# Patient Record
Sex: Female | Born: 1965
Health system: Southern US, Community
[De-identification: ages and names within clinical notes are randomized; demographics above are authoritative.]

## PROBLEM LIST (undated history)

## (undated) DIAGNOSIS — G5600 Carpal tunnel syndrome, unspecified upper limb: Secondary | ICD-10-CM

## (undated) DIAGNOSIS — I8390 Asymptomatic varicose veins of unspecified lower extremity: Secondary | ICD-10-CM

## (undated) DIAGNOSIS — K219 Gastro-esophageal reflux disease without esophagitis: Secondary | ICD-10-CM

## (undated) DIAGNOSIS — K589 Irritable bowel syndrome without diarrhea: Secondary | ICD-10-CM

## (undated) DIAGNOSIS — I10 Essential (primary) hypertension: Secondary | ICD-10-CM

## (undated) DIAGNOSIS — F329 Major depressive disorder, single episode, unspecified: Secondary | ICD-10-CM

## (undated) DIAGNOSIS — E119 Type 2 diabetes mellitus without complications: Secondary | ICD-10-CM

## (undated) DIAGNOSIS — F909 Attention-deficit hyperactivity disorder, unspecified type: Secondary | ICD-10-CM

## (undated) DIAGNOSIS — F419 Anxiety disorder, unspecified: Secondary | ICD-10-CM

## (undated) DIAGNOSIS — F32A Depression, unspecified: Secondary | ICD-10-CM

## (undated) HISTORY — PX: WISDOM TOOTH EXTRACTION: SHX21

## (undated) HISTORY — DX: Asymptomatic varicose veins of unspecified lower extremity: I83.90

## (undated) HISTORY — PX: COLONOSCOPY: SHX174

## (undated) HISTORY — PX: DILATION AND CURETTAGE OF UTERUS: SHX78

## (undated) HISTORY — PX: NOVASURE ABLATION: SHX5394

---

## 1998-07-24 ENCOUNTER — Inpatient Hospital Stay (HOSPITAL_COMMUNITY): Admission: AD | Admit: 1998-07-24 | Discharge: 1998-07-24 | Payer: Self-pay | Admitting: Obstetrics and Gynecology

## 1998-09-17 ENCOUNTER — Ambulatory Visit (HOSPITAL_COMMUNITY): Admission: AD | Admit: 1998-09-17 | Discharge: 1998-09-17 | Payer: Self-pay | Admitting: Obstetrics and Gynecology

## 1998-12-03 ENCOUNTER — Inpatient Hospital Stay (HOSPITAL_COMMUNITY): Admission: AD | Admit: 1998-12-03 | Discharge: 1998-12-05 | Payer: Self-pay | Admitting: Obstetrics and Gynecology

## 2001-03-19 ENCOUNTER — Other Ambulatory Visit: Admission: RE | Admit: 2001-03-19 | Discharge: 2001-03-19 | Payer: Self-pay | Admitting: Obstetrics and Gynecology

## 2002-03-01 ENCOUNTER — Encounter: Admission: RE | Admit: 2002-03-01 | Discharge: 2002-03-01 | Payer: Self-pay | Admitting: Urology

## 2002-03-01 ENCOUNTER — Encounter: Payer: Self-pay | Admitting: Urology

## 2002-06-26 ENCOUNTER — Other Ambulatory Visit: Admission: RE | Admit: 2002-06-26 | Discharge: 2002-06-26 | Payer: Self-pay | Admitting: Obstetrics and Gynecology

## 2003-04-05 HISTORY — PX: FOOT FASCIOTOMY: SHX1659

## 2003-05-09 ENCOUNTER — Other Ambulatory Visit: Admission: RE | Admit: 2003-05-09 | Discharge: 2003-05-09 | Payer: Self-pay | Admitting: Obstetrics and Gynecology

## 2003-06-02 ENCOUNTER — Ambulatory Visit (HOSPITAL_COMMUNITY): Admission: RE | Admit: 2003-06-02 | Discharge: 2003-06-02 | Payer: Self-pay | Admitting: Obstetrics and Gynecology

## 2004-10-26 ENCOUNTER — Other Ambulatory Visit: Admission: RE | Admit: 2004-10-26 | Discharge: 2004-10-26 | Payer: Self-pay | Admitting: Obstetrics and Gynecology

## 2011-02-02 MED ORDER — ALBUTEROL SULFATE HFA 108 (90 BASE) MCG/ACT IN AERS
INHALATION_SPRAY | RESPIRATORY_TRACT | Status: AC
  Filled 2011-02-02: qty 6.7

## 2011-02-28 ENCOUNTER — Encounter (HOSPITAL_COMMUNITY): Payer: Self-pay | Admitting: Pharmacist

## 2011-03-03 NOTE — H&P (Signed)
NAMEVEARL, AITKEN                  ACCOUNT NO.:  [id number]  MEDICAL RECORD NO.:  [id number]  LOCATION:  [facility]                         FACILITY:  [facility]  PHYSICIAN:  Dineen Kid. Rana Snare, M.D.    DATE OF BIRTH:  [birth year]  DATE OF ADMISSION:  01/17/2011 DATE OF DISCHARGE:                             HISTORY & PHYSICAL   HISTORY OF PRESENT ILLNESS:  Patricia Arroyo is a 45 year old, G2, P2, with pelvic pain, history of fibroids.  Her mother recently died of ovarian cancer.  She also is having symptomatic pelvic relaxation.  She desires surgical repair and presents for hysterectomy with repair of cystocele and rectocele.  She does have stress urinary incontinence on a regular basis as well as pelvic pain.  She has had NovaSure ablation in the past which has helped with her heavy periods and recurrent polyps, but now she desires hysterectomy due to ongoing pain.  PAST MEDICAL HISTORY:  Significant for hypertension and ADHD.  PAST SURGICAL HISTORY:  She has had 2 vaginal deliveries.  She has also had D and C with polypectomy and D and C with ablation.  ALLERGIES:  She reports an allergy to SULFA and PENICILLIN.  MEDICATIONS:  Lisinopril, Maxzide, Nexium, and Concerta.  PHYSICAL EXAMINATION:  HEART:  Regular rate and rhythm. LUNGS:  Clear to auscultation bilaterally. ABDOMEN:  Nondistended and nontender. PELVIC:  Uterus is anteverted, mobile, nontender.  She does have a second-degree cystocele and rectocele.  Uterus is normal in size, but anteverted and mobile. VITAL SIGNS:  Her blood pressure is 132/82.  IMPRESSION AND PLAN:  Pelvic pain, family history of ovarian cancer, fibroids, cystocele, rectocele, and stress urinary incontinence.  The patient had evaluation by a urologist for possible sling procedure. Could not get this as a combined procedure, so she wished to proceed with hysterectomy with anterior-posterior repair at this time.  PLAN:  To proceed with laparoscopic-assisted vaginal  hysterectomy.  She does desire removal of both tubes and ovaries with an anterior-posterior repair and sacrospinous suspension of the vaginal cuff.  I discussed the procedure at length.  Its risks, its benefits, success and complication rates.  She does understand that the anterior repair does not have great success with urinary incontinence that she may still require a suspension procedure.  She does want to proceed however at this time.  The risks include but not limited to risk of infection, bleeding, damage to the bowel, bladder, ureters, risks associated with anesthesia and blood transfusion.  She does give her informed consent and wished to proceed.     Dineen Kid Rana Snare, M.D.     DCL/MEDQ  D:  03/03/2011  T:  03/03/2011  Job:  [id number]

## 2011-03-03 NOTE — H&P (Signed)
  H&P DIctated #[id number]  03/03/11 DL

## 2011-03-07 ENCOUNTER — Encounter (HOSPITAL_COMMUNITY): Payer: Self-pay

## 2011-03-07 ENCOUNTER — Encounter (HOSPITAL_COMMUNITY)
Admission: RE | Admit: 2011-03-07 | Discharge: 2011-03-07 | Disposition: A | Discharge status: Home / Self Care | Payer: 59 | Source: Ambulatory Visit | Attending: Obstetrics and Gynecology | Admitting: Obstetrics and Gynecology

## 2011-03-07 HISTORY — DX: Gastro-esophageal reflux disease without esophagitis: K21.9

## 2011-03-07 HISTORY — DX: Depression, unspecified: F32.A

## 2011-03-07 HISTORY — DX: Attention-deficit hyperactivity disorder, unspecified type: F90.9

## 2011-03-07 HISTORY — DX: Irritable bowel syndrome, unspecified: K58.9

## 2011-03-07 HISTORY — DX: Essential (primary) hypertension: I10

## 2011-03-07 HISTORY — DX: Major depressive disorder, single episode, unspecified: F32.9

## 2011-03-07 LAB — CBC
HCT: 42.5 % (ref 36.0–46.0)
Hemoglobin: 14.1 g/dL (ref 12.0–15.0)
MCH: 30 pg (ref 26.0–34.0)
MCHC: 33.2 g/dL (ref 30.0–36.0)
MCV: 90.4 fL (ref 78.0–100.0)
Platelets: 251 10*3/uL (ref 150–400)
RBC: 4.7 MIL/uL (ref 3.87–5.11)
RDW: 12.7 % (ref 11.5–15.5)
WBC: 9.1 10*3/uL (ref 4.0–10.5)

## 2011-03-07 LAB — BASIC METABOLIC PANEL
BUN: 12 mg/dL (ref 6–23)
CO2: 27 mEq/L (ref 19–32)
Calcium: 9.6 mg/dL (ref 8.4–10.5)
Chloride: 100 mEq/L (ref 96–112)
Creatinine, Ser: 0.73 mg/dL (ref 0.50–1.10)
GFR calc Af Amer: >90 mL/min (ref >90)
GFR calc non Af Amer: >90 mL/min (ref >90)
Glucose, Bld: 127 mg/dL — ABNORMAL HIGH (ref 70–99)
Potassium: 3.2 mEq/L — ABNORMAL LOW (ref 3.5–5.1)
Sodium: 138 mEq/L (ref 135–145)

## 2011-03-07 LAB — SURGICAL PCR SCREEN
MRSA, PCR: NEGATIVE
Staphylococcus aureus: POSITIVE — AB

## 2011-03-07 NOTE — Patient Instructions (Signed)
YOUR PROCEDURE IS SCHEDULED ON:03/10/11  ENTER THROUGH THE MAIN ENTRANCE OF [facility] AT:6am  USE DESK PHONE AND DIAL [phone] TO INFORM us OF YOUR ARRIVAL  CALL [phone] IF YOU HAVE ANY QUESTIONS OR PROBLEMS PRIOR TO YOUR ARRIVAL.  REMEMBER: DO NOT EAT OR DRINK AFTER MIDNIGHT :Wed  SPECIAL INSTRUCTIONS:   YOU MAY BRUSH YOUR TEETH THE MORNING OF SURGERY   TAKE THESE MEDICINES THE DAY OF SURGERY WITH SIP OF WATER: Nexium, BP meds   DO NOT WEAR JEWELRY, EYE MAKEUP, LIPSTICK OR DARK FINGERNAIL POLISH DO NOT WEAR LOTIONS OR DEODORANT DO NOT SHAVE FOR 48 HOURS PRIOR TO SURGERY  YOU WILL NOT BE ALLOWED TO DRIVE YOURSELF HOME.  NAME OF DRIVER:Steven

## 2011-03-07 NOTE — H&P (Addendum)
  DiIctated H&P 03/07/11 #[id number] DL Pt seen and examined.  H&P is current. DL  16/1/09 [id number]

## 2011-03-08 NOTE — H&P (Signed)
Patricia Arroyo, Patricia Arroyo                  ACCOUNT NO.:  [id number]  MEDICAL RECORD NO.:  [id number]  LOCATION:  [facility]                           FACILITY:  [facility]  PHYSICIAN:  Dineen Kid. Rana Snare, M.D.    DATE OF BIRTH:  25-Oct-1965  DATE OF ADMISSION:  03/07/2011 DATE OF DISCHARGE:  03/07/2011                             HISTORY & PHYSICAL   HISTORY OF PRESENT ILLNESS:  Ms. Patricia Arroyo is a 45 year old, she is a G2, P2, problems with pelvic relaxation.  She has a cystocele and rectocele which requires digitalization.  She also has pelvic pain and uterine prolapse.  She has had a uterine ablation in the past which has controlled her menorrhagia but currently she is having difficulty with the pelvic relaxation, cystocele, rectocele, and does require digitalization.  She does have mild urinary incontinence.  She desires definitive surgical intervention and presents today for hysterectomy but also repair of the cystocele and rectocele and pelvic prolapse.  PAST MEDICAL HISTORY:  Significant for hypertension and gastroesophageal reflux and irritable bowel syndrome.  PAST SURGICAL HISTORY:  She has had vaginal births.  She has also had NovaSure endometrial ablation.  She does have a history of uterine fibroids.  MEDICATIONS: 1. Lisinopril 10 mg daily. 2. Maxzide 37.5 mg. 3. Omeprazole 40 mg.  ALLERGIES:  She gives an allergy to PENICILLIN and SULFA, however, has done Rocephin in the past without difficulty.  She also has an allergy to ADHESIVES and BANDAGES.  PHYSICAL EXAMINATION:  VITAL SIGNS:  Her blood pressure is 128/88. HEART:  Regular rate and rhythm. LUNGS:  Clear to auscultation bilaterally. ABDOMEN:  Soft, nontender, nondistended.  No rebound or guarding.  No flank pain. GENITOURINARY:  She has normal external genitalia, Bartholin, Skene's, urethra.  She does have a second-degree cystocele, second-degree rectocele.  She has uterine prolapse with the cervix just inside the introitus.  She  does have a hypermobile urethra.  The uterus is anteverted and approximately 6-8 week size.  IMPRESSION/PLAN:  Symptomatic pelvic relaxation with cystocele and rectocele and mild stress urinary incontinence.  She was offered surgical evaluation by urologist for possible incontinence procedures and sling procedures.  She declines.  She does desire definitive surgical intervention including anterior posterior colporrhaphy and sacrospinous colposuspension, also planned hysterectomy, planned laparoscopic-assisted hysterectomy.  She also desires removal of both tubes and ovaries.  Does have a mother who died of ovarian cancer recently due to ovarian cancer.  She would like these removed.  I had a lengthy discussion with regards to the surgery, its risks, its benefits. Risks include, but not limited to risk of infection, bleeding, damage to bowel, bladder, ureters, possibly this may not alleviate her or remove entirely her risk of ovarian cancer by removing the ovaries.  She also understands that the anterior repair may not successfully fix her incontinence and certainly does not have a long- term success rate that is above 60%.  She does give her informed consent and wished to proceed.  I also discussed the possibility of long-term or short-term catheter or self-catheterization.  Again she gives informed consent and wished to proceed.     Dineen Kid Rana Snare, M.D.  DCL/MEDQ  D:  03/07/2011  T:  03/08/2011  Job:  [id number]

## 2011-03-09 MED ORDER — CEFOTETAN DISODIUM 1 G IJ SOLR
1.0000 g | INTRAMUSCULAR | Status: AC
  Administered 2011-03-10: 1 g via INTRAVENOUS
  Filled 2011-03-09: qty 1

## 2011-03-10 ENCOUNTER — Other Ambulatory Visit: Payer: Self-pay | Admitting: Obstetrics and Gynecology

## 2011-03-10 ENCOUNTER — Encounter (HOSPITAL_COMMUNITY): Payer: Self-pay | Admitting: Registered Nurse

## 2011-03-10 ENCOUNTER — Ambulatory Visit (HOSPITAL_COMMUNITY)
Admission: RE | Admit: 2011-03-10 | Discharge: 2011-03-12 | Disposition: A | Discharge status: Home / Self Care | Payer: 59 | Source: Ambulatory Visit | Attending: Obstetrics and Gynecology | Admitting: Obstetrics and Gynecology

## 2011-03-10 ENCOUNTER — Ambulatory Visit (HOSPITAL_COMMUNITY): Payer: 59 | Admitting: Registered Nurse

## 2011-03-10 ENCOUNTER — Encounter (HOSPITAL_COMMUNITY)
Admission: RE | Disposition: A | Discharge status: Home / Self Care | Payer: Self-pay | Source: Ambulatory Visit | Attending: Obstetrics and Gynecology

## 2011-03-10 ENCOUNTER — Encounter (HOSPITAL_COMMUNITY): Payer: Self-pay | Admitting: *Deleted

## 2011-03-10 DIAGNOSIS — N831 Corpus luteum cyst of ovary, unspecified side: Secondary | ICD-10-CM | POA: Insufficient documentation

## 2011-03-10 DIAGNOSIS — N8300 Follicular cyst of ovary, unspecified side: Secondary | ICD-10-CM | POA: Insufficient documentation

## 2011-03-10 DIAGNOSIS — Z90710 Acquired absence of both cervix and uterus: Secondary | ICD-10-CM

## 2011-03-10 DIAGNOSIS — N838 Other noninflammatory disorders of ovary, fallopian tube and broad ligament: Secondary | ICD-10-CM | POA: Insufficient documentation

## 2011-03-10 DIAGNOSIS — N812 Incomplete uterovaginal prolapse: Secondary | ICD-10-CM | POA: Insufficient documentation

## 2011-03-10 DIAGNOSIS — N949 Unspecified condition associated with female genital organs and menstrual cycle: Secondary | ICD-10-CM | POA: Insufficient documentation

## 2011-03-10 HISTORY — PX: ANTERIOR AND POSTERIOR REPAIR: SHX5121

## 2011-03-10 HISTORY — PX: LAPAROSCOPIC ASSISTED VAGINAL HYSTERECTOMY: SHX5398

## 2011-03-10 HISTORY — PX: SALPINGOOPHORECTOMY: SHX82

## 2011-03-10 SURGERY — HYSTERECTOMY, VAGINAL, LAPAROSCOPY-ASSISTED
Anesthesia: General

## 2011-03-10 MED ORDER — FENTANYL CITRATE 0.05 MG/ML IJ SOLN
INTRAMUSCULAR | Status: AC
  Filled 2011-03-10: qty 5

## 2011-03-10 MED ORDER — IBUPROFEN 600 MG PO TABS
600.0000 mg | ORAL_TABLET | Freq: Four times a day (QID) | ORAL | Status: DC | PRN
  Administered 2011-03-11 (×2): 600 mg via ORAL
  Filled 2011-03-10 (×2): qty 1

## 2011-03-10 MED ORDER — DEXTROSE-NACL 5-0.45 % IV SOLN
INTRAVENOUS | Status: DC
  Administered 2011-03-10 – 2011-03-11 (×2): via INTRAVENOUS

## 2011-03-10 MED ORDER — NEOSTIGMINE METHYLSULFATE 1 MG/ML IJ SOLN
INTRAMUSCULAR | Status: DC | PRN
  Administered 2011-03-10: 3 mg via INTRAVENOUS

## 2011-03-10 MED ORDER — MORPHINE SULFATE 10 MG/ML IJ SOLN
INTRAMUSCULAR | Status: AC
  Filled 2011-03-10: qty 1

## 2011-03-10 MED ORDER — HYDROMORPHONE HCL PF 1 MG/ML IJ SOLN
0.2500 mg | INTRAMUSCULAR | Status: DC | PRN
  Administered 2011-03-10 (×2): 0.5 mg via INTRAVENOUS

## 2011-03-10 MED ORDER — OXYCODONE-ACETAMINOPHEN 5-325 MG PO TABS
1.0000 | ORAL_TABLET | ORAL | Status: DC | PRN
  Administered 2011-03-11 (×3): 1 via ORAL
  Administered 2011-03-12: 2 via ORAL
  Administered 2011-03-12: 1 via ORAL
  Filled 2011-03-10: qty 1
  Filled 2011-03-10: qty 2
  Filled 2011-03-10 (×4): qty 1

## 2011-03-10 MED ORDER — ZOLPIDEM TARTRATE 5 MG PO TABS
5.0000 mg | ORAL_TABLET | Freq: Every evening | ORAL | Status: DC | PRN
  Administered 2011-03-11: 10 mg via ORAL
  Filled 2011-03-10: qty 2

## 2011-03-10 MED ORDER — HYDROMORPHONE HCL PF 1 MG/ML IJ SOLN
INTRAMUSCULAR | Status: AC
  Administered 2011-03-10: 0.5 mg via INTRAVENOUS
  Filled 2011-03-10: qty 1

## 2011-03-10 MED ORDER — DEXAMETHASONE SODIUM PHOSPHATE 10 MG/ML IJ SOLN
INTRAMUSCULAR | Status: DC | PRN
  Administered 2011-03-10: 10 mg via INTRAVENOUS

## 2011-03-10 MED ORDER — ROCURONIUM BROMIDE 100 MG/10ML IV SOLN
INTRAVENOUS | Status: DC | PRN
  Administered 2011-03-10: 50 mg via INTRAVENOUS

## 2011-03-10 MED ORDER — HYDROMORPHONE 0.3 MG/ML IV SOLN
INTRAVENOUS | Status: DC
  Administered 2011-03-10: 0.6 mg via INTRAVENOUS
  Administered 2011-03-10: 7.5 mg via INTRAVENOUS
  Administered 2011-03-10: 0.3 mg via INTRAVENOUS
  Administered 2011-03-10: 0.6 mg via INTRAVENOUS
  Administered 2011-03-11: 1.5 mg via INTRAVENOUS
  Administered 2011-03-11: 0.3 mg via INTRAVENOUS

## 2011-03-10 MED ORDER — DEXAMETHASONE SODIUM PHOSPHATE 10 MG/ML IJ SOLN
INTRAMUSCULAR | Status: AC
  Filled 2011-03-10: qty 1

## 2011-03-10 MED ORDER — ONDANSETRON HCL 4 MG/2ML IJ SOLN: 4.0000 mg | Freq: Four times a day (QID) | INTRAMUSCULAR | Status: DC | PRN

## 2011-03-10 MED ORDER — NEOSTIGMINE METHYLSULFATE 1 MG/ML IJ SOLN
INTRAMUSCULAR | Status: AC
  Filled 2011-03-10: qty 10

## 2011-03-10 MED ORDER — LACTATED RINGERS IV SOLN
INTRAVENOUS | Status: DC | PRN
  Administered 2011-03-10 (×3): via INTRAVENOUS

## 2011-03-10 MED ORDER — ROCURONIUM BROMIDE 50 MG/5ML IV SOLN
INTRAVENOUS | Status: AC
  Filled 2011-03-10: qty 1

## 2011-03-10 MED ORDER — DIPHENHYDRAMINE HCL 50 MG/ML IJ SOLN: 12.5000 mg | Freq: Four times a day (QID) | INTRAMUSCULAR | Status: DC | PRN

## 2011-03-10 MED ORDER — LISINOPRIL 10 MG PO TABS
10.0000 mg | ORAL_TABLET | Freq: Every day | ORAL | Status: DC
  Administered 2011-03-11 – 2011-03-12 (×2): 10 mg via ORAL
  Filled 2011-03-10 (×4): qty 1

## 2011-03-10 MED ORDER — KETOROLAC TROMETHAMINE 30 MG/ML IJ SOLN: 15.0000 mg | Freq: Once | INTRAMUSCULAR | Status: DC | PRN

## 2011-03-10 MED ORDER — ONDANSETRON HCL 4 MG/2ML IJ SOLN
INTRAMUSCULAR | Status: AC
  Filled 2011-03-10: qty 2

## 2011-03-10 MED ORDER — BUPIVACAINE HCL (PF) 0.25 % IJ SOLN
INTRAMUSCULAR | Status: DC | PRN
  Administered 2011-03-10: 10 mL

## 2011-03-10 MED ORDER — PROMETHAZINE HCL 25 MG/ML IJ SOLN: 12.5000 mg | INTRAMUSCULAR | Status: DC | PRN

## 2011-03-10 MED ORDER — ONDANSETRON HCL 4 MG/2ML IJ SOLN
INTRAMUSCULAR | Status: DC | PRN
  Administered 2011-03-10: 4 mg via INTRAVENOUS

## 2011-03-10 MED ORDER — MIDAZOLAM HCL 2 MG/2ML IJ SOLN
INTRAMUSCULAR | Status: AC
  Filled 2011-03-10: qty 2

## 2011-03-10 MED ORDER — LIDOCAINE HCL (CARDIAC) 20 MG/ML IV SOLN
INTRAVENOUS | Status: AC
  Filled 2011-03-10: qty 5

## 2011-03-10 MED ORDER — GLYCOPYRROLATE 0.2 MG/ML IJ SOLN
INTRAMUSCULAR | Status: AC
  Filled 2011-03-10: qty 1

## 2011-03-10 MED ORDER — NALOXONE HCL 0.4 MG/ML IJ SOLN: 0.4000 mg | INTRAMUSCULAR | Status: DC | PRN

## 2011-03-10 MED ORDER — DIPHENHYDRAMINE HCL 12.5 MG/5ML PO ELIX
12.5000 mg | ORAL_SOLUTION | Freq: Four times a day (QID) | ORAL | Status: DC | PRN
  Filled 2011-03-10: qty 5

## 2011-03-10 MED ORDER — MIDAZOLAM HCL 5 MG/5ML IJ SOLN
INTRAMUSCULAR | Status: DC | PRN
  Administered 2011-03-10: 2 mg via INTRAVENOUS

## 2011-03-10 MED ORDER — PROPOFOL 10 MG/ML IV EMUL
INTRAVENOUS | Status: AC
  Filled 2011-03-10: qty 20

## 2011-03-10 MED ORDER — GLYCOPYRROLATE 0.2 MG/ML IJ SOLN
INTRAMUSCULAR | Status: DC | PRN
  Administered 2011-03-10: .4 mg via INTRAVENOUS

## 2011-03-10 MED ORDER — LACTATED RINGERS IV SOLN
INTRAVENOUS | Status: DC
  Administered 2011-03-10: 125 mL/h via INTRAVENOUS

## 2011-03-10 MED ORDER — SODIUM CHLORIDE 0.9 % IJ SOLN: 9.0000 mL | INTRAMUSCULAR | Status: DC | PRN

## 2011-03-10 MED ORDER — MORPHINE SULFATE 10 MG/ML IJ SOLN
INTRAMUSCULAR | Status: DC | PRN
  Administered 2011-03-10 (×2): 5 mg via INTRAVENOUS

## 2011-03-10 MED ORDER — FENTANYL CITRATE 0.05 MG/ML IJ SOLN
INTRAMUSCULAR | Status: DC | PRN
  Administered 2011-03-10 (×5): 50 ug via INTRAVENOUS
  Administered 2011-03-10: 100 ug via INTRAVENOUS
  Administered 2011-03-10 (×3): 50 ug via INTRAVENOUS

## 2011-03-10 MED ORDER — HYDROMORPHONE HCL PF 1 MG/ML IJ SOLN
0.2000 mg | INTRAMUSCULAR | Status: DC | PRN
  Filled 2011-03-10: qty 1

## 2011-03-10 MED ORDER — MENTHOL 3 MG MT LOZG: 1.0000 | LOZENGE | OROMUCOSAL | Status: DC | PRN

## 2011-03-10 MED ORDER — TRIAMTERENE-HCTZ 37.5-25 MG PO TABS
1.0000 | ORAL_TABLET | Freq: Every day | ORAL | Status: DC
  Administered 2011-03-11 – 2011-03-12 (×2): 1 via ORAL
  Filled 2011-03-10 (×5): qty 1

## 2011-03-10 MED ORDER — SODIUM CHLORIDE 0.9 % IR SOLN
Status: DC | PRN
  Administered 2011-03-10: 1

## 2011-03-10 MED ORDER — PROPOFOL 10 MG/ML IV EMUL
INTRAVENOUS | Status: DC | PRN
  Administered 2011-03-10: 200 mg via INTRAVENOUS

## 2011-03-10 MED ORDER — METHYLPHENIDATE HCL ER (OSM) 36 MG PO TBCR: 54.0000 mg | EXTENDED_RELEASE_TABLET | ORAL | Status: DC

## 2011-03-10 MED ORDER — LIDOCAINE HCL (CARDIAC) 20 MG/ML IV SOLN
INTRAVENOUS | Status: DC | PRN
  Administered 2011-03-10: 100 mg via INTRAVENOUS

## 2011-03-10 SURGICAL SUPPLY — 47 items
BLADE SURG 15 STRL LF C SS BP (BLADE) ×2 IMPLANT
BLADE SURG 15 STRL SS (BLADE) ×1
CABLE HIGH FREQUENCY MONO STRZ (ELECTRODE) IMPLANT
CATH ROBINSON RED A/P 16FR (CATHETERS) ×3 IMPLANT
CLOTH BEACON ORANGE TIMEOUT ST (SAFETY) ×3 IMPLANT
CONT PATH 16OZ SNAP LID 3702 (MISCELLANEOUS) ×3 IMPLANT
COVER TABLE BACK 60X90 (DRAPES) ×3 IMPLANT
DECANTER SPIKE VIAL GLASS SM (MISCELLANEOUS) IMPLANT
DERMABOND ADVANCED (GAUZE/BANDAGES/DRESSINGS) ×1
DERMABOND ADVANCED .7 DNX12 (GAUZE/BANDAGES/DRESSINGS) ×2 IMPLANT
ELECT LIGASURE LONG (ELECTRODE) ×6 IMPLANT
ELECT REM PT RETURN 9FT ADLT (ELECTROSURGICAL)
ELECTRODE REM PT RTRN 9FT ADLT (ELECTROSURGICAL) IMPLANT
FORCEPS CUTTING 45CM 5MM (CUTTING FORCEPS) ×6 IMPLANT
GAUZE PACKING 1 X5 YD ST (GAUZE/BANDAGES/DRESSINGS) ×3 IMPLANT
GAUZE SPONGE 4X4 16PLY XRAY LF (GAUZE/BANDAGES/DRESSINGS) ×6 IMPLANT
GLOVE BIO SURGEON STRL SZ8 (GLOVE) ×3 IMPLANT
GLOVE BIOGEL PI IND STRL 6.5 (GLOVE) ×2 IMPLANT
GLOVE BIOGEL PI INDICATOR 6.5 (GLOVE) ×1
GLOVE SURG ORTHO 8.0 STRL STRW (GLOVE) ×9 IMPLANT
GOWN PREVENTION PLUS LG XLONG (DISPOSABLE) ×9 IMPLANT
NEEDLE HYPO 22GX1.5 SAFETY (NEEDLE) IMPLANT
NEEDLE INSUFFLATION 14GA 120MM (NEEDLE) ×3 IMPLANT
NEEDLE MAYO 6 CRC TAPER PT (NEEDLE) IMPLANT
NEEDLE SPNL 22GX3.5 QUINCKE BK (NEEDLE) IMPLANT
NS IRRIG 1000ML POUR BTL (IV SOLUTION) ×3 IMPLANT
PACK LAVH (CUSTOM PROCEDURE TRAY) ×3 IMPLANT
PACK VAGINAL WOMENS (CUSTOM PROCEDURE TRAY) IMPLANT
SET IRRIG TUBING LAPAROSCOPIC (IRRIGATION / IRRIGATOR) ×3 IMPLANT
SOLUTION ELECTROLUBE (MISCELLANEOUS) IMPLANT
STRIP CLOSURE SKIN 1/4X3 (GAUZE/BANDAGES/DRESSINGS) IMPLANT
SUT MNCRL 0 MO-4 VIOLET 18 CR (SUTURE) ×4 IMPLANT
SUT MNCRL 0 VIOLET 6X18 (SUTURE) ×2 IMPLANT
SUT MNCRL AB 0 CT1 27 (SUTURE) IMPLANT
SUT MON AB 2-0 CT1 27 (SUTURE) ×12 IMPLANT
SUT MON AB 2-0 CT1 36 (SUTURE) IMPLANT
SUT MONOCRYL 0 6X18 (SUTURE) ×1
SUT MONOCRYL 0 MO 4 18  CR/8 (SUTURE) ×2
SUT PDS AB 0 CT1 27 (SUTURE) ×6 IMPLANT
SUT VICRYL 0 UR6 27IN ABS (SUTURE) ×3 IMPLANT
SUT VICRYL RAPIDE 3 0 (SUTURE) ×3 IMPLANT
TOWEL OR 17X24 6PK STRL BLUE (TOWEL DISPOSABLE) ×6 IMPLANT
TRAY FOLEY CATH 14FR (SET/KITS/TRAYS/PACK) ×3 IMPLANT
TROCAR Z-THREAD BLADED 11X100M (TROCAR) ×3 IMPLANT
TROCAR Z-THREAD BLADED 5X100MM (TROCAR) ×3 IMPLANT
WARMER LAPAROSCOPE (MISCELLANEOUS) ×3 IMPLANT
WATER STERILE IRR 1000ML POUR (IV SOLUTION) ×3 IMPLANT

## 2011-03-10 NOTE — Op Note (Addendum)
Op note (dictation [phone]) Preop:  Symptomatic Pelvic Relaxation Postop:  Same Proc:  LAVH/BSO/A&P REpair/ SSLS Juliocesar Blasius Asst Neal GET EBL 200cc Findings normal anatomy Compl : None Drains Foley Vag Packing

## 2011-03-10 NOTE — Transfer of Care (Signed)
Immediate Anesthesia Transfer of Care Note  Patient: Patricia Arroyo  Procedure(s) Performed:  LAPAROSCOPIC ASSISTED VAGINAL HYSTERECTOMY; SALPINGO OOPHERECTOMY; ANTERIOR (CYSTOCELE) AND POSTERIOR REPAIR (RECTOCELE) - with Sacrospinous Ligament Suspension  Patient Location: PACU  Anesthesia Type: General  Level of Consciousness: sedated  Airway & Oxygen Therapy: Patient Spontanous Breathing and Patient connected to face mask oxygen  Post-op Assessment: Report given to PACU RN  Post vital signs: Reviewed  Complications: No apparent anesthesia complications

## 2011-03-10 NOTE — Addendum Note (Signed)
Addendum  created 03/10/11 1312 by Doreene Burke   Modules edited:Anesthesia LDA

## 2011-03-10 NOTE — Anesthesia Preprocedure Evaluation (Signed)
Anesthesia Evaluation  Patient identified by MRN, date of birth, ID band Patient awake    Reviewed: Allergy & Precautions, H&P , NPO status , Patient's Chart, lab work & pertinent test results, reviewed documented beta blocker date and time   History of Anesthesia Complications Negative for: history of anesthetic complications  Airway Mallampati: II TM Distance: >3 FB Neck ROM: full    Dental  (+) Teeth Intact   Pulmonary neg pulmonary ROS,  clear to auscultation  Pulmonary exam normal       Cardiovascular Exercise Tolerance: Good hypertension, On Medications regular Normal    Neuro/Psych PSYCHIATRIC DISORDERS (depression, ADHD) Negative Neurological ROS     GI/Hepatic Neg liver ROS, GERD-  Medicated,  Endo/Other  Morbid obesity  Renal/GU negative Renal ROS  Female GU complaint     Musculoskeletal   Abdominal   Peds  Hematology negative hematology ROS (+)   Anesthesia Other Findings   Reproductive/Obstetrics negative OB ROS                           Anesthesia Physical Anesthesia Plan  ASA: II  Anesthesia Plan: General ETT   Post-op Pain Management:    Induction:   Airway Management Planned:   Additional Equipment:   Intra-op Plan:   Post-operative Plan:   Informed Consent: I have reviewed the patients History and Physical, chart, labs and discussed the procedure including the risks, benefits and alternatives for the proposed anesthesia with the patient or authorized representative who has indicated his/her understanding and acceptance.   Dental Advisory Given  Plan Discussed with: CRNA and Surgeon  Anesthesia Plan Comments:         Anesthesia Quick Evaluation

## 2011-03-10 NOTE — Anesthesia Postprocedure Evaluation (Signed)
Anesthesia Post Note  Patient: Patricia Arroyo  Procedure(s) Performed:  LAPAROSCOPIC ASSISTED VAGINAL HYSTERECTOMY; SALPINGO OOPHERECTOMY; ANTERIOR (CYSTOCELE) AND POSTERIOR REPAIR (RECTOCELE) - with Sacrospinous Ligament Suspension  Anesthesia type: General  Patient location: PACU  Post pain: Pain level controlled  Post assessment: Post-op Vital signs reviewed  Last Vitals:  Filed Vitals:   03/10/11 1000  BP: 127/76  Pulse: 64  Temp:   Resp: 12    Post vital signs: Reviewed  Level of consciousness: sedated  Complications: No apparent anesthesia complicationsfj

## 2011-03-10 NOTE — Preoperative (Signed)
Beta Blockers   Reason not to administer Beta Blockers:Not Applicable 

## 2011-03-11 LAB — CBC
HCT: 36 % (ref 36.0–46.0)
Hemoglobin: 12 g/dL (ref 12.0–15.0)
MCH: 30.3 pg (ref 26.0–34.0)
MCHC: 33.3 g/dL (ref 30.0–36.0)
MCV: 90.9 fL (ref 78.0–100.0)
Platelets: 260 10*3/uL (ref 150–400)
RBC: 3.96 MIL/uL (ref 3.87–5.11)
RDW: 12.8 % (ref 11.5–15.5)
WBC: 17.7 10*3/uL — ABNORMAL HIGH (ref 4.0–10.5)

## 2011-03-11 MED ORDER — MENTHOL 3 MG MT LOZG: 1.0000 | LOZENGE | OROMUCOSAL | Status: DC | PRN

## 2011-03-11 MED ORDER — ZOLPIDEM TARTRATE 10 MG PO TABS: 10.0000 mg | ORAL_TABLET | Freq: Every evening | ORAL | Status: DC | PRN

## 2011-03-11 MED ORDER — PANTOPRAZOLE SODIUM 40 MG PO TBEC
40.0000 mg | DELAYED_RELEASE_TABLET | Freq: Every day | ORAL | Status: DC
  Administered 2011-03-12: 40 mg via ORAL
  Filled 2011-03-11 (×2): qty 1

## 2011-03-11 NOTE — Progress Notes (Signed)
1 Day Post-Op Procedure(s): LAPAROSCOPIC ASSISTED VAGINAL HYSTERECTOMY SALPINGO OOPHERECTOMY ANTERIOR (CYSTOCELE) AND POSTERIOR REPAIR (RECTOCELE)  Subjective: Patient reports tolerating PO and + flatus.    Objective: I have reviewed patient's vital signs, intake and output, medications and labs.  General: alert, cooperative and mild distress Incision clean cry and intact.  BS + Vaginal packing removed  Assessment: s/p Procedure(s): LAPAROSCOPIC ASSISTED VAGINAL HYSTERECTOMY SALPINGO OOPHERECTOMY ANTERIOR (CYSTOCELE) AND POSTERIOR REPAIR (RECTOCELE): stable  Plan: Advance diet Encourage ambulation bladder training  LOS: 1 day    Cristianna Cyr C 03/11/2011, 8:25 AM

## 2011-03-11 NOTE — Op Note (Signed)
NAMEBALBINA, DEPACE                  ACCOUNT NO.:  [id number]  MEDICAL RECORD NO.:  [id number]  LOCATION:  9317                          FACILITY:  [facility]  PHYSICIAN:  Dineen Kid. Rana Snare, M.D.    DATE OF BIRTH:  02/11/1966  DATE OF PROCEDURE:  03/10/2011 DATE OF DISCHARGE:                              OPERATIVE REPORT   PREOPERATIVE DIAGNOSES:  Symptomatic pelvic relaxation with cystocele and rectocele, uterine prolapse, and pelvic pain.  POSTOPERATIVE DIAGNOSES:  Symptomatic pelvic relaxation with cystocele and rectocele, uterine prolapse, and pelvic pain.  PROCEDURES: 1. Laparoscopic-assisted vaginal hysterectomy. 2. Bilateral salpingo-oophorectomy. 3. Anterior and posterior colporrhaphy with sacrospinous ligament     suspension.  SURGEON:  Dineen Kid. Rana Snare, MD.  ASSISTANTFreddy Finner, MD.  ANESTHESIA:  General endotracheal.  INDICATIONS:  Ms. Priego is a 45 year old, G2, P2, with worsening problems with symptomatic pelvic relaxation including cystocele, rectocele, which requires digitalization, also pelvic pain and uterine prolapse.  She has had a uterine ablation in the past.  She is also having some mild urinary incontinence.  She desires definitive surgical intervention and presents for hysterectomy with repair of cystocele and rectocele and pelvic prolapse.  Risks and benefits were discussed at length.  Informed consent was obtained.  See history and physical for further details.  FINDINGS:  At the time of surgery, normal-appearing appendix.  Uterus is slightly enlarged, normal-appearing ovaries and pelvis.  DESCRIPTION OF PROCEDURE:  After adequate anesthesia, the patient was placed in the dorsal lithotomy position.  She was sterilely prepped and draped.  The bladder was sterilely drained.  Speculum was placed into the cervix and a Hulka tenaculum was then placed.  1-cm infraumbilical skin incision was made, a Veress needle was inserted, and the abdomen was insufflated  with dullness to percussion.  An 11-mm trocar was inserted and the above findings were noted by laparoscope.  5-mm trocar was inserted left to the midline 2 fingerbreadths above pubic symphysis under direct visualization.  After careful and systematic evaluation of the abdomen and pelvis, a Gyrus cutting forceps were used to ligate and dissect across the right infundibulopelvic ligament down to the inferior portion of the broad ligament with the ovary and fallopian tube falling towards the uterus.  This was done similarly across the left infundibulopelvic ligament, down across the round ligament with good hemostasis achieved and care taken to avoid the underlying ureter at the same time achieving good hemostasis.  The bladder was then elevated and a small window was made at the uterovesical junction.  The abdomen was then desufflated, the trocar was removed.  Legs repositioned.  A posterior weighted speculum was placed. Cervix was grasped and circumscribed with Bovie cautery.  The posterior colpotomy was performed.  LigaSure instrument was used to ligate across the uterosacral ligaments bilaterally, the uterine vasculature bilaterally, and the bladder pillars.  The anterior vaginal mucosa was then dissected off the anterior surface of the cervix and the anterior peritoneum was entered sharply and a Deaver retractor replaced underneath the bladder. The cardinal ligaments and the inferior portion of the broad ligaments were then ligated with the LigaSure instrument and the uterus, tubes, and ovaries were  removed.  Small packing was placed.  The uterosacral ligaments were identified, ligated with 0 Monocryl suture.  The posterior peritoneum was then closed in a pursestring fashion.  The posterior vagina was then closed in a vertical fashion using figure-of- eights of 0 Monocryl suture plicating the uterosacral ligaments in the midline.  The anterior vaginal mucosa was then dissected in the  midline and reflected laterally off the pubovesical cervical fascia.  The pubovesical cervical fascia was then plicated in the midline using figure-of-eight stitch of 0 Monocryl suture all the way up until approximately 1 cm from the urethral meatus with good support noted of the cystocele and the urethra.  The excess vaginal mucosa was then trimmed and the anterior vaginal mucosa was then closed with 2-0 Monocryl in a running fashion with good approximation and good hemostasis noted.  Allis clamps were placed on the lateral sides of the perineal body.  A triangular flap of skin was made across the perineal body and then the posterior vaginal mucosa was then undermined and dissected up to the midline with the posterior vaginal mucosa, reflecting the posterior vaginal mucosa laterally.  Blunt and sharp dissection were carried out reflecting the rectal fascia off the vaginal mucosa.  Blunt dissection was carried out to the right sacrospinous ligament.  A Schutt ligature carrier was used to place a suture of 0 PDS through the right uterosacral ligament approximately 2 cm off the sacral spinous process.  A Mayo needle was used to place the suture through the right apex of the vagina in a pulley-type stitch.  The posterior repair was then carried out plicating the rectal fascia using figure-of-eight 0 Monocryl suture down to the introitus.  Excess vaginal mucosa was then trimmed and the sacrospinous suspension suture was then tightened with good support of the apex of the vagina.  The poster vaginal mucosa was then closed with 2-0 Monocryl suture.  The perineal body was then plicated using 0 Monocryl suture of figure-of-eight plicating the superficial transverse perineal muscles.  The perineal skin was then closed in a subcuticular fashion with a 2-0 Monocryl suture, with good support of the perineum and posterior vagina noted and good support of the vaginal apex noted from the sacrospinous  process.  Vaginal packing was then placed coated with estrogen cream.  Foley catheter placed with return of clear yellow urine.  Rectal exam revealed no evidence of loss of integrity to the rectum or colon and a good support noted.  After re- gloving, legs were repositioned, abdomen reinsufflated.  Laparoscope was used to re-evaluate the abdomen and pelvis, and after Nezhat suction irrigator was used to irrigate the abdomen and pelvis, good hemostasis was achieved.  All pedicles appeared to be hemostatic and ureters peristalsed nicely without evidence of any compromise.  The abdomen was then desufflated and trocars were removed.  The infraumbilical skin incision was closed with 0 Vicryl interrupted suture in the fascia, 3-0 Vicryl Rapide subcuticular suture.  The 5-mm site was closed with 3-0 Vicryl Rapide and Dermabond.  The incisions were then injected with 0.25% Marcaine, total of 10 mL used.  The patient was stable on transfer to the recovery room.  Sponge, instrument, and needle count was normal x3.  The estimated blood loss was 250 mL.  Again, the patient received 1 g of Ceftin preoperatively.     Dineen Kid Rana Snare, M.D.     DCL/MEDQ  D:  03/11/2011  T:  03/11/2011  Job:  [id number]

## 2011-03-11 NOTE — Anesthesia Postprocedure Evaluation (Signed)
  Anesthesia Post-op Note  Patient: Patricia Arroyo  Procedure(s) Performed:  LAPAROSCOPIC ASSISTED VAGINAL HYSTERECTOMY; SALPINGO OOPHERECTOMY; ANTERIOR (CYSTOCELE) AND POSTERIOR REPAIR (RECTOCELE) - with Sacrospinous Ligament Suspension  Patient Location: [facility]  Anesthesia Type: General  Level of Consciousness: awake, alert  and oriented  Airway and Oxygen Therapy: Patient Spontanous Breathing  Post-op Assessment: Patient's Cardiovascular Status Stable and Respiratory Function Stable  Post-op Vital Signs: Reviewed and stable  Complications: No apparent anesthesia complications

## 2011-03-11 NOTE — Addendum Note (Signed)
Addendum  created 03/11/11 0920 by Edison Pace, CRNA   Modules edited:Notes Section

## 2011-03-12 MED ORDER — NITROFURANTOIN MONOHYD MACRO 100 MG PO CAPS: 100.0000 mg | ORAL_CAPSULE | ORAL | Status: AC

## 2011-03-12 MED ORDER — ESTRADIOL 2 MG PO TABS: 2.0000 mg | ORAL_TABLET | Freq: Every day | ORAL | Status: DC

## 2011-03-12 MED ORDER — OXYCODONE-ACETAMINOPHEN 5-500 MG PO CAPS: 1.0000 | ORAL_CAPSULE | ORAL | Status: AC | PRN

## 2011-03-12 NOTE — Discharge Summary (Signed)
Physician Discharge Summary  Patient ID: Patricia Arroyo MRN: [id number] DOB/AGE: 1965/11/30 45 y.o.  Admit date: 03/10/2011 Discharge date: 03/12/2011  Admission Diagnoses:  Discharge Diagnoses:  Active Problems:  * No active hospital problems. *    Discharged Condition: good  Hospital Course: underwent LAVH/BSO/A&P repair and SSLS without complications.  She had good return of bowel function and tolerated a regular diet and oral pain meds by day 1.  She had 2 voiding trials which were unsuccessful and so was sent home with a foley in place.  She remained afebrile and had normal post op hgb.  Consults: none  Significant Diagnostic Studies: labs:   Treatments: IV hydration, antibiotics: cefotetan and surgery: As above  Discharge Exam: Blood pressure 118/64, pulse 79, temperature 98.4 F (36.9 C), temperature source Oral, resp. rate 22, height 5\' 2"  (1.575 m), weight 89.359 kg (197 lb), SpO2 98.00%. Incisions CD & I   BS+ Abd soft and nontender  Disposition: home  Discharge Orders    Future Orders Please Complete By Expires   Diet general      Increase activity slowly      Call MD for:  temperature >100.4      Call MD for:  persistant nausea and vomiting      Call MD for:  severe uncontrolled pain      Call MD for:  redness, tenderness, or signs of infection (pain, swelling, redness, odor or green/yellow discharge around incision site)      Call MD for:  difficulty breathing, headache or visual disturbances      Discharge instructions      Comments:   Continue with leg bag and the office will call you to come in for voiding trial on 03/14/11     Current Discharge Medication List    START taking these medications   Details  estradiol (ESTRACE) 2 MG tablet Take 1 tablet (2 mg total) by mouth daily. Qty: 30 tablet, Refills: 12    nitrofurantoin, macrocrystal-monohydrate, (MACROBID) 100 MG capsule Take 1 capsule (100 mg total) by mouth 1 day or 1 dose. Qty: 10 capsule,  Refills: 0    oxyCODONE-acetaminophen (TYLOX) 5-500 MG per capsule Take 1 capsule by mouth every 4 (four) hours as needed for pain. Qty: 30 capsule, Refills: 0      CONTINUE these medications which have NOT CHANGED   Details  lisinopril (PRINIVIL,ZESTRIL) 20 MG tablet Take 10 mg by mouth daily.      triamterene-hydrochlorothiazide (MAXZIDE-25) 37.5-25 MG per tablet Take 1 tablet by mouth daily.      ibuprofen (ADVIL,MOTRIN) 200 MG tablet Take 200-800 mg by mouth every 6 (six) hours as needed. For pain     methylphenidate (CONCERTA) 54 MG CR tablet Take 54 mg by mouth every morning.        STOP taking these medications     esomeprazole (NEXIUM) 40 MG capsule          Signed: Siena Poehler C 03/12/2011, 3:04 PM

## 2011-03-12 NOTE — Progress Notes (Signed)
D/c instructions reviewed with pt. And husband.  Both state understanding of same.  Instructed pt. And husband on how to convert foley bag to leg bag and how to reconnect to leg bag.  Both state understanding and showed demo. Pt. Given 10cc syringe to remove cath. On Monday.  Ambulated to car with staff without incident.  D/c'd home with husband.

## 2011-03-12 NOTE — Progress Notes (Signed)
2 Days Post-Op Procedure(s): LAPAROSCOPIC ASSISTED VAGINAL HYSTERECTOMY SALPINGO OOPHERECTOMY ANTERIOR (CYSTOCELE) AND POSTERIOR REPAIR (RECTOCELE)  Subjective: Patient reports tolerating PO and + flatus.    Objective: I have reviewed patient's vital signs, intake and output, medications and labs.  [employer] CDI  BS+ Abd soft nt.   Assessment: s/p Procedure(s): LAPAROSCOPIC ASSISTED VAGINAL HYSTERECTOMY SALPINGO OOPHERECTOMY ANTERIOR (CYSTOCELE) AND POSTERIOR REPAIR (RECTOCELE): stable and tolerating diet Was unable to void when Foley dc'd Plan: Discharge home with leg bad to fu in office for voiding trial rx Tylox, Macrobid, Estradiol2mg   LOS: 2 days    Jasslyn Finkel C 03/12/2011, 2:55 PM

## 2011-03-14 ENCOUNTER — Encounter (HOSPITAL_COMMUNITY): Payer: Self-pay | Admitting: Obstetrics and Gynecology

## 2011-03-15 ENCOUNTER — Encounter (HOSPITAL_COMMUNITY): Payer: Self-pay | Admitting: *Deleted

## 2011-03-15 ENCOUNTER — Inpatient Hospital Stay (HOSPITAL_COMMUNITY)
Admission: AD | Admit: 2011-03-15 | Discharge: 2011-03-15 | Disposition: A | Discharge status: Home / Self Care | Payer: 59 | Source: Ambulatory Visit | Attending: Obstetrics and Gynecology | Admitting: Obstetrics and Gynecology

## 2011-03-15 DIAGNOSIS — N9989 Other postprocedural complications and disorders of genitourinary system: Secondary | ICD-10-CM | POA: Insufficient documentation

## 2011-03-15 DIAGNOSIS — R339 Retention of urine, unspecified: Secondary | ICD-10-CM | POA: Insufficient documentation

## 2011-03-15 DIAGNOSIS — N3289 Other specified disorders of bladder: Secondary | ICD-10-CM

## 2011-03-15 DIAGNOSIS — IMO0002 Reserved for concepts with insufficient information to code with codable children: Secondary | ICD-10-CM | POA: Insufficient documentation

## 2011-03-15 MED ORDER — OXYBUTYNIN CHLORIDE ER 5 MG PO TB24: 5.0000 mg | ORAL_TABLET | Freq: Every day | ORAL | Status: DC

## 2011-03-15 MED ORDER — OXYBUTYNIN CHLORIDE 5 MG PO TABS
5.0000 mg | ORAL_TABLET | Freq: Once | ORAL | Status: AC
  Administered 2011-03-15: 5 mg via ORAL
  Filled 2011-03-15: qty 1

## 2011-03-15 NOTE — Progress Notes (Signed)
Pt states she had suegery by Dr Park Pope 12/06/`12-vag hystectomy and rectocele repair-tonight it feels like the foley is not draining

## 2011-03-15 NOTE — Progress Notes (Signed)
Foley cath d/c'd -another 16 french foley cath reinserted using sterile technique-pt tolerated but states it is very uncomfortable

## 2011-03-15 NOTE — ED Provider Notes (Signed)
History     Chief Complaint  Patient presents with  . Urinary Retention   HPI This is a 45 y.o. who is post op from a LAVH with AP repair by Dr Rana Snare on 03/10/11. She failed a voiding trial and was sent home with a foley. Had some problems today with the foley not draining and having pain. Went to the office and had it replaced and was started on pyridium. Through the evening, has had continued pain and states has not had "enough urine" in bag.   OB History    Grav Para Term Preterm Abortions TAB SAB Ect Mult Living   2 2 2       2       Past Medical History  Diagnosis Date  . Hypertension   . GERD (gastroesophageal reflux disease)   . Depression     h/o---no meds now  . ADHD (attention deficit hyperactivity disorder)   . IBS (irritable bowel syndrome)     Past Surgical History  Procedure Date  . Foot fasciotomy   . Dilation and curettage of uterus   . Novasure ablation   . Wisdom tooth extraction   . Colonoscopy   . Laparoscopic assisted vaginal hysterectomy 03/10/2011    Procedure: LAPAROSCOPIC ASSISTED VAGINAL HYSTERECTOMY;  Surgeon: Turner Daniels, MD;  Location: [facility];  Service: Gynecology;  Laterality: N/A;  . Salpingoophorectomy 03/10/2011    Procedure: SALPINGO OOPHERECTOMY;  Surgeon: Turner Daniels, MD;  Location: [facility];  Service: Gynecology;  Laterality: Bilateral;  . Anterior and posterior repair 03/10/2011    Procedure: ANTERIOR (CYSTOCELE) AND POSTERIOR REPAIR (RECTOCELE);  Surgeon: Turner Daniels, MD;  Location: [facility];  Service: Gynecology;  Laterality: N/A;  with Sacrospinous Ligament Suspension    History reviewed. No pertinent family history.  History  Substance Use Topics  . Smoking status: Never Smoker   . Smokeless tobacco: Not on file  . Alcohol Use: Yes     occasionally    Allergies:  Allergies  Allergen Reactions  . Adhesive (Tape) Other (See Comments)    Pt states that she is allergic to adhesive on Band-Aids.  . Penicillins Rash  . Sulfa  Antibiotics Rash    Prescriptions prior to admission  Medication Sig Dispense Refill  . estradiol (ESTRACE) 2 MG tablet Take 1 tablet (2 mg total) by mouth daily.  30 tablet  12  . ibuprofen (ADVIL,MOTRIN) 200 MG tablet Take 200-800 mg by mouth every 6 (six) hours as needed. For pain       . lisinopril (PRINIVIL,ZESTRIL) 20 MG tablet Take 10 mg by mouth daily.        . methylphenidate (CONCERTA) 54 MG CR tablet Take 54 mg by mouth every morning.        . nitrofurantoin, macrocrystal-monohydrate, (MACROBID) 100 MG capsule Take 1 capsule (100 mg total) by mouth 1 day or 1 dose.  10 capsule  0  . oxyCODONE-acetaminophen (TYLOX) 5-500 MG per capsule Take 1 capsule by mouth every 4 (four) hours as needed for pain.  30 capsule  0  . triamterene-hydrochlorothiazide (MAXZIDE-25) 37.5-25 MG per tablet Take 1 tablet by mouth daily.          ROS As above  Physical Exam   Blood pressure 130/78, pulse 78, resp. rate 22, SpO2 98.00%.  Physical Exam  Constitutional: She is oriented to person, place, and time. She appears well-developed and well-nourished. She appears distressed (uncomfortable with bladder pain).  HENT:  Head: Normocephalic.  Cardiovascular:  Normal rate.   Respiratory: Effort normal.  GI: Soft. She exhibits no distension and no mass. There is tenderness (appropriate for surgery). There is no rebound and no guarding.       Ecchymosis on abdomen s/p surgery scars  Bladder not overly distended Bladder scan read    Musculoskeletal: Normal range of motion.  Neurological: She is alert and oriented to person, place, and time.  Skin: Skin is warm and dry.  Psychiatric: She has a normal mood and affect.    MAU Course  Procedures  MDM Consulted Dr Henderson Cloud Will replace foley and give a dose of Ditropan   Assessment and Plan  Patient feels much better after Ditropan and foley replacement Some urine return with new foley Will d/c home with Rx  Ditropan  [facility] 03/15/2011, 2:29 AM

## 2011-04-01 ENCOUNTER — Other Ambulatory Visit: Payer: Self-pay

## 2011-04-01 ENCOUNTER — Emergency Department (HOSPITAL_COMMUNITY)
Admission: EM | Admit: 2011-04-01 | Discharge: 2011-04-01 | Disposition: A | Discharge status: Home / Self Care | Payer: 59 | Attending: Emergency Medicine | Admitting: Emergency Medicine

## 2011-04-01 ENCOUNTER — Emergency Department (HOSPITAL_COMMUNITY): Payer: 59

## 2011-04-01 ENCOUNTER — Encounter (HOSPITAL_COMMUNITY): Payer: Self-pay | Admitting: Emergency Medicine

## 2011-04-01 DIAGNOSIS — K589 Irritable bowel syndrome without diarrhea: Secondary | ICD-10-CM | POA: Insufficient documentation

## 2011-04-01 DIAGNOSIS — Z79899 Other long term (current) drug therapy: Secondary | ICD-10-CM | POA: Insufficient documentation

## 2011-04-01 DIAGNOSIS — R000 Tachycardia, unspecified: Secondary | ICD-10-CM | POA: Insufficient documentation

## 2011-04-01 DIAGNOSIS — R079 Chest pain, unspecified: Secondary | ICD-10-CM | POA: Insufficient documentation

## 2011-04-01 DIAGNOSIS — F329 Major depressive disorder, single episode, unspecified: Secondary | ICD-10-CM | POA: Insufficient documentation

## 2011-04-01 DIAGNOSIS — F909 Attention-deficit hyperactivity disorder, unspecified type: Secondary | ICD-10-CM | POA: Insufficient documentation

## 2011-04-01 DIAGNOSIS — R42 Dizziness and giddiness: Secondary | ICD-10-CM | POA: Insufficient documentation

## 2011-04-01 DIAGNOSIS — I10 Essential (primary) hypertension: Secondary | ICD-10-CM | POA: Insufficient documentation

## 2011-04-01 DIAGNOSIS — F3289 Other specified depressive episodes: Secondary | ICD-10-CM | POA: Insufficient documentation

## 2011-04-01 DIAGNOSIS — K219 Gastro-esophageal reflux disease without esophagitis: Secondary | ICD-10-CM | POA: Insufficient documentation

## 2011-04-01 LAB — CBC
HCT: 40 % (ref 36.0–46.0)
Hemoglobin: 13.5 g/dL (ref 12.0–15.0)
MCH: 29.7 pg (ref 26.0–34.0)
MCHC: 33.8 g/dL (ref 30.0–36.0)
MCV: 88.1 fL (ref 78.0–100.0)
Platelets: 327 10*3/uL (ref 150–400)
RBC: 4.54 MIL/uL (ref 3.87–5.11)
RDW: 12.5 % (ref 11.5–15.5)
WBC: 9.9 10*3/uL (ref 4.0–10.5)

## 2011-04-01 LAB — BASIC METABOLIC PANEL
BUN: 14 mg/dL (ref 6–23)
CO2: 27 mEq/L (ref 19–32)
Calcium: 10.2 mg/dL (ref 8.4–10.5)
Chloride: 100 mEq/L (ref 96–112)
Creatinine, Ser: 0.75 mg/dL (ref 0.50–1.10)
GFR calc Af Amer: >90 mL/min (ref >90)
GFR calc non Af Amer: >90 mL/min (ref >90)
Glucose, Bld: 115 mg/dL — ABNORMAL HIGH (ref 70–99)
Potassium: 3.4 mEq/L — ABNORMAL LOW (ref 3.5–5.1)
Sodium: 138 mEq/L (ref 135–145)

## 2011-04-01 LAB — DIFFERENTIAL
Basophils Absolute: 0 10*3/uL (ref 0.0–0.1)
Basophils Relative: 0 % (ref 0–1)
Eosinophils Absolute: 0.1 10*3/uL (ref 0.0–0.7)
Eosinophils Relative: 1 % (ref 0–5)
Lymphocytes Relative: 28 % (ref 12–46)
Lymphs Abs: 2.7 10*3/uL (ref 0.7–4.0)
Monocytes Absolute: 0.7 10*3/uL (ref 0.1–1.0)
Monocytes Relative: 7 % (ref 3–12)
Neutro Abs: 6.4 10*3/uL (ref 1.7–7.7)
Neutrophils Relative %: 64 % (ref 43–77)

## 2011-04-01 LAB — PROTIME-INR
INR: 0.87 (ref 0.00–1.49)
Prothrombin Time: 12 seconds (ref 11.6–15.2)

## 2011-04-01 LAB — POCT I-STAT TROPONIN I: Troponin i, poc: 0 ng/mL (ref 0.00–0.08)

## 2011-04-01 MED ORDER — SODIUM CHLORIDE 0.9 % IV SOLN
Freq: Once | INTRAVENOUS | Status: AC
  Administered 2011-04-01: 16:00:00 via INTRAVENOUS

## 2011-04-01 MED ORDER — IOHEXOL 300 MG/ML  SOLN
100.0000 mL | Freq: Once | INTRAMUSCULAR | Status: AC | PRN
  Administered 2011-04-01: 100 mL via INTRAVENOUS

## 2011-04-01 NOTE — ED Provider Notes (Signed)
History     CSN: [id number]  Arrival date & time 04/01/11  1406   First MD Initiated Contact with Patient 04/01/11 1532      Chief Complaint  Patient presents with  . Chest Pain  . Dizziness    (Consider location/radiation/quality/duration/timing/severity/associated sxs/prior treatment) Patient is a 45 y.o. female presenting with chest pain. The history is provided by the patient.  Chest Pain   pt here with exertional chest pain x 48hrs associated with dizziness and lightheadedness-pain/pressure is non radaiting and not a/w diaphoresis--h/o recent surgery, denies leg pain or swelling-no cough or fever, no meds taken pta, no h/o same--pt called her GYN and told to come here for a chest CT  Past Medical History  Diagnosis Date  . Hypertension   . GERD (gastroesophageal reflux disease)   . Depression     h/o---no meds now  . ADHD (attention deficit hyperactivity disorder)   . IBS (irritable bowel syndrome)     Past Surgical History  Procedure Date  . Foot fasciotomy   . Dilation and curettage of uterus   . Novasure ablation   . Wisdom tooth extraction   . Colonoscopy   . Laparoscopic assisted vaginal hysterectomy 03/10/2011    Procedure: LAPAROSCOPIC ASSISTED VAGINAL HYSTERECTOMY;  Surgeon: Turner Daniels, MD;  Location: [facility];  Service: Gynecology;  Laterality: N/A;  . Salpingoophorectomy 03/10/2011    Procedure: SALPINGO OOPHERECTOMY;  Surgeon: Turner Daniels, MD;  Location: [facility];  Service: Gynecology;  Laterality: Bilateral;  . Anterior and posterior repair 03/10/2011    Procedure: ANTERIOR (CYSTOCELE) AND POSTERIOR REPAIR (RECTOCELE);  Surgeon: Turner Daniels, MD;  Location: [facility];  Service: Gynecology;  Laterality: N/A;  with Sacrospinous Ligament Suspension    No family history on file.  History  Substance Use Topics  . Smoking status: Never Smoker   . Smokeless tobacco: Not on file  . Alcohol Use: Yes     occasionally    OB History    Grav Para Term Preterm  Abortions TAB SAB Ect Mult Living   2 2 2       2       Review of Systems  Cardiovascular: Positive for chest pain.  All other systems reviewed and are negative.    Allergies  Adhesive; Penicillins; and Sulfa antibiotics  Home Medications   Current Outpatient Rx  Name Route Sig Dispense Refill  . ESTRADIOL 2 MG PO TABS Oral Take 1 tablet (2 mg total) by mouth daily. 30 tablet 12  . IBUPROFEN 200 MG PO TABS Oral Take 200-800 mg by mouth every 6 (six) hours as needed. For pain     . LISINOPRIL 20 MG PO TABS Oral Take 10 mg by mouth daily. Pt takes 1/2 tab    . METHYLPHENIDATE HCL ER 54 MG PO TBCR Oral Take 54 mg by mouth every morning.      Marland Kitchen OMEPRAZOLE 40 MG PO CPDR Oral Take 40 mg by mouth daily.      . TRIAMTERENE-HCTZ 37.5-25 MG PO TABS Oral Take 1 tablet by mouth daily.      Marland Kitchen ZOLPIDEM TARTRATE 10 MG PO TABS Oral Take 10 mg by mouth at bedtime as needed. sleep       BP 119/75  Pulse 103  Temp(Src) 98.4 F (36.9 C) (Oral)  Resp 20  SpO2 97%  Physical Exam  Nursing note and vitals reviewed. Constitutional: She is oriented to person, place, and time. She appears well-developed and well-nourished.  Non-toxic  appearance. No distress.  HENT:  Head: Normocephalic and atraumatic.  Eyes: Conjunctivae, EOM and lids are normal. Pupils are equal, round, and reactive to light.  Neck: Normal range of motion. Neck supple. No tracheal deviation present. No mass present.  Cardiovascular: Regular rhythm and normal heart sounds.  Tachycardia present.  Exam reveals no gallop.   No murmur heard. Pulmonary/Chest: Effort normal and breath sounds normal. No stridor. No respiratory distress. She has no decreased breath sounds. She has no wheezes. She has no rhonchi. She has no rales.  Abdominal: Soft. Normal appearance and bowel sounds are normal. She exhibits no distension. There is no tenderness. There is no rebound and no CVA tenderness.  Musculoskeletal: Normal range of motion. She exhibits  no edema and no tenderness.  Neurological: She is alert and oriented to person, place, and time. She has normal strength. No cranial nerve deficit or sensory deficit. GCS eye subscore is 4. GCS verbal subscore is 5. GCS motor subscore is 6.  Skin: Skin is warm and dry. No abrasion and no rash noted.  Psychiatric: She has a normal mood and affect. Her speech is normal and behavior is normal.    ED Course  Procedures (including critical care time)   Labs Reviewed  I-STAT TROPONIN I  CBC  DIFFERENTIAL  BASIC METABOLIC PANEL  PROTIME-INR   No results found.   No diagnosis found.    MDM   Date: 04/01/2011  Rate: 96  Rhythm: normal sinus rhythm  QRS Axis: normal  Intervals: normal  ST/T Wave abnormalities: nonspecific T wave changes  Conduction Disutrbances:none  Narrative Interpretation:   Old EKG Reviewed: none available  7:21 PM Patient had a negative workup for pulmonary embolism by CT evaluation of the chest. This is cardiac enzymes negative. I recommended that the patient admitted for further evaluation of her chest pain. She has deferred at this time. Risk of sudden cardiac death was explained to her and she understands this. Patient given  referral to cardiology on call       Toy Baker, MD 04/01/11 Ernestina [place]

## 2011-04-01 NOTE — ED Notes (Signed)
Pt was recently placed on hormones after the surgery

## 2011-04-01 NOTE — ED Notes (Signed)
States that when she came back from the restroom she had "floaters and a yellow and red prism" in her left eye that moved from the center of her vision to the outer aspect of her vision. Denies dizziness or chest pain.

## 2011-04-01 NOTE — ED Notes (Signed)
Pt had surgery on the 12/5 hysterectomy and seen md today c/o chest pain and dizzyness states that she thought it was her bp, alert x4,

## 2011-04-01 NOTE — ED Notes (Signed)
Patient complains of dizziness and light headedness beginning yesterday, and chest tightness and pressure that comes with standing and moving around.

## 2011-04-03 ENCOUNTER — Observation Stay (HOSPITAL_COMMUNITY)
Admission: EM | Admit: 2011-04-03 | Discharge: 2011-04-04 | Disposition: A | Discharge status: Home / Self Care | Payer: 59 | Attending: Emergency Medicine | Admitting: Emergency Medicine

## 2011-04-03 ENCOUNTER — Emergency Department (HOSPITAL_COMMUNITY): Payer: 59

## 2011-04-03 ENCOUNTER — Other Ambulatory Visit: Payer: Self-pay

## 2011-04-03 ENCOUNTER — Encounter (HOSPITAL_COMMUNITY): Payer: Self-pay | Admitting: *Deleted

## 2011-04-03 DIAGNOSIS — R0789 Other chest pain: Principal | ICD-10-CM | POA: Insufficient documentation

## 2011-04-03 HISTORY — DX: Anxiety disorder, unspecified: F41.9

## 2011-04-03 LAB — TROPONIN I: Troponin I: <0.3 ng/mL (ref <0.30)

## 2011-04-03 LAB — BASIC METABOLIC PANEL
BUN: 13 mg/dL (ref 6–23)
CO2: 25 mEq/L (ref 19–32)
Calcium: 9.8 mg/dL (ref 8.4–10.5)
Chloride: 102 mEq/L (ref 96–112)
Creatinine, Ser: 0.77 mg/dL (ref 0.50–1.10)
GFR calc Af Amer: >90 mL/min (ref >90)
GFR calc non Af Amer: >90 mL/min (ref >90)
Glucose, Bld: 103 mg/dL — ABNORMAL HIGH (ref 70–99)
Potassium: 3.5 mEq/L (ref 3.5–5.1)
Sodium: 138 mEq/L (ref 135–145)

## 2011-04-03 LAB — POCT I-STAT TROPONIN I: Troponin i, poc: 0 ng/mL (ref 0.00–0.08)

## 2011-04-03 LAB — CBC
HCT: 40.6 % (ref 36.0–46.0)
Hemoglobin: 13.9 g/dL (ref 12.0–15.0)
MCH: 30.3 pg (ref 26.0–34.0)
MCHC: 34.2 g/dL (ref 30.0–36.0)
MCV: 88.5 fL (ref 78.0–100.0)
Platelets: 299 10*3/uL (ref 150–400)
RBC: 4.59 MIL/uL (ref 3.87–5.11)
RDW: 12.4 % (ref 11.5–15.5)
WBC: 12.5 10*3/uL — ABNORMAL HIGH (ref 4.0–10.5)

## 2011-04-03 MED ORDER — ACETAMINOPHEN 325 MG PO TABS: 650.0000 mg | ORAL_TABLET | ORAL | Status: DC | PRN

## 2011-04-03 MED ORDER — ZOLPIDEM TARTRATE 5 MG PO TABS
5.0000 mg | ORAL_TABLET | Freq: Every evening | ORAL | Status: DC | PRN
  Filled 2011-04-03: qty 1

## 2011-04-03 MED ORDER — SERTRALINE HCL 100 MG PO TABS
100.0000 mg | ORAL_TABLET | Freq: Every day | ORAL | Status: DC
  Administered 2011-04-04: 100 mg via ORAL
  Filled 2011-04-03 (×2): qty 1

## 2011-04-03 MED ORDER — METOPROLOL TARTRATE 25 MG PO TABS
50.0000 mg | ORAL_TABLET | Freq: Once | ORAL | Status: AC
  Administered 2011-04-03: 50 mg via ORAL
  Filled 2011-04-03: qty 2

## 2011-04-03 MED ORDER — ZOLPIDEM TARTRATE 5 MG PO TABS
5.0000 mg | ORAL_TABLET | Freq: Every evening | ORAL | Status: DC | PRN
  Administered 2011-04-04: 5 mg via ORAL

## 2011-04-03 MED ORDER — ASPIRIN 81 MG PO CHEW
324.0000 mg | CHEWABLE_TABLET | Freq: Once | ORAL | Status: AC
  Administered 2011-04-03: 324 mg via ORAL
  Filled 2011-04-03: qty 4

## 2011-04-03 MED ORDER — ONDANSETRON HCL 4 MG/2ML IJ SOLN: 4.0000 mg | Freq: Four times a day (QID) | INTRAMUSCULAR | Status: DC | PRN

## 2011-04-03 MED ORDER — ASPIRIN EC 325 MG PO TBEC
325.0000 mg | DELAYED_RELEASE_TABLET | Freq: Every day | ORAL | Status: DC
  Filled 2011-04-03: qty 1

## 2011-04-03 NOTE — ED Provider Notes (Signed)
Patient report received from Dr. Juleen China at 6:44 PM. Patient presents with atypical chest pain, ongoing for the past several days. She is being moved to the CDU under a chest pain protocol.  11:04 PM The patient is resting comfortably. She denies any chest pain. Her vital signs stable. She will have a stress test tomorrow to rule out cardiac disease. The chest CTA was performed 2 days ago which shows no acute finding and no evidence of PE. Patient requests for the Ambien 10mg  and Zoloft 100mg  for tonight. These are her home medication.  On exam her lung is clear to auscultation bilaterally, heart is with regular rate and rhythm without rales murmurs or gallops. Abdomen is soft and nontender.  Patient is to have cardiac stress test tomorrow. Initially she was ordered to have a coronary CT. However she just recently had a chest CTA. After discussed with Dr. Hyacinth Meeker he requested the patient to have a echo stress tests. Dr. Hyacinth Meeker will  continue to monitor patient care.  Fayrene Helper, PA 04/04/11 [phone]

## 2011-04-03 NOTE — ED Notes (Signed)
Family at bedside. 

## 2011-04-03 NOTE — ED Notes (Signed)
Pt being admitted and has order for yellow but yellow is closing early tonight.

## 2011-04-03 NOTE — ED Provider Notes (Signed)
History    45yf with CP. Intermittent. Onset Thursday. Continues to have episodes of chest "tightness" and "dizziness." Describes as sensation of lightheadness. No SOB. No appreciable exacerbating or relieving factors. No fever or chills. No unusual leg pain or swelling CTA chest on ER visit 2d ago neg for PE. Recommended admission at that time but declined. Returning because symptoms continuing.   CSN: [id number]  Arrival date & time 04/03/11  1426   First MD Initiated Contact with Patient 04/03/11 1547      Chief Complaint  Patient presents with  . Chest Pain  . Dizziness    (Consider location/radiation/quality/duration/timing/severity/associated sxs/prior treatment) HPI  Past Medical History  Diagnosis Date  . Hypertension   . GERD (gastroesophageal reflux disease)   . Depression     h/o---no meds now  . ADHD (attention deficit hyperactivity disorder)   . IBS (irritable bowel syndrome)   . Anxiety     Past Surgical History  Procedure Date  . Foot fasciotomy   . Dilation and curettage of uterus   . Novasure ablation   . Wisdom tooth extraction   . Colonoscopy   . Laparoscopic assisted vaginal hysterectomy 03/10/2011    Procedure: LAPAROSCOPIC ASSISTED VAGINAL HYSTERECTOMY;  Surgeon: Turner Daniels, MD;  Location: [facility];  Service: Gynecology;  Laterality: N/A;  . Salpingoophorectomy 03/10/2011    Procedure: SALPINGO OOPHERECTOMY;  Surgeon: Turner Daniels, MD;  Location: [facility];  Service: Gynecology;  Laterality: Bilateral;  . Anterior and posterior repair 03/10/2011    Procedure: ANTERIOR (CYSTOCELE) AND POSTERIOR REPAIR (RECTOCELE);  Surgeon: Turner Daniels, MD;  Location: [facility];  Service: Gynecology;  Laterality: N/A;  with Sacrospinous Ligament Suspension    History reviewed. No pertinent family history.  History  Substance Use Topics  . Smoking status: Never Smoker   . Smokeless tobacco: Not on file  . Alcohol Use: Yes     occasionally    OB History    Grav Para  Term Preterm Abortions TAB SAB Ect Mult Living   2 2 2       2       Review of Systems   Review of symptoms negative unless otherwise noted in HPI.   Allergies  Adhesive; Penicillins; and Sulfa antibiotics  Home Medications   Current Outpatient Rx  Name Route Sig Dispense Refill  . ESTRADIOL 2 MG PO TABS Oral Take 1 tablet (2 mg total) by mouth daily. 30 tablet 12  . IBUPROFEN 200 MG PO TABS Oral Take 200-800 mg by mouth every 6 (six) hours as needed. For pain     . LISINOPRIL 20 MG PO TABS Oral Take 10 mg by mouth daily.     . METHYLPHENIDATE HCL ER 54 MG PO TBCR Oral Take 54 mg by mouth every morning.      Marland Kitchen OMEPRAZOLE 40 MG PO CPDR Oral Take 40 mg by mouth daily.      . TRIAMTERENE-HCTZ 37.5-25 MG PO TABS Oral Take 1 tablet by mouth daily.      Marland Kitchen ZOLPIDEM TARTRATE 10 MG PO TABS Oral Take 10 mg by mouth at bedtime as needed. sleep       BP 116/72  Pulse 88  Temp(Src) 98.7 F (37.1 C) (Oral)  Resp 16  SpO2 98%  Physical Exam  Nursing note and vitals reviewed. Constitutional: She appears well-developed and well-nourished. No distress.  HENT:  Head: Normocephalic and atraumatic.  Eyes: Conjunctivae are normal. Right eye exhibits no discharge. Left eye exhibits  no discharge.  Neck: Neck supple.  Cardiovascular: Normal rate, regular rhythm and normal heart sounds.  Exam reveals no gallop and no friction rub.   No murmur heard. Pulmonary/Chest: Effort normal and breath sounds normal. No respiratory distress.  Abdominal: Soft. She exhibits no distension. There is tenderness.       Mild diffuse tenderness, appropriate for recent surgery  Musculoskeletal: She exhibits edema. She exhibits no tenderness.       Mild symmetric b/l le edema  Neurological: She is alert.  Skin: Skin is warm and dry.  Psychiatric: She has a normal mood and affect. Her behavior is normal. Thought content normal.    ED Course  Procedures (including critical care time)  Labs Reviewed  CBC -  Abnormal; Notable for the following:    WBC 12.5 (*)    All other components within normal limits  BASIC METABOLIC PANEL - Abnormal; Notable for the following:    Glucose, Bld 103 (*)    All other components within normal limits  TROPONIN I  I-STAT TROPONIN I  I-STAT TROPONIN I  I-STAT TROPONIN I  I-STAT TROPONIN I   Dg Chest 2 View  04/03/2011  *RADIOLOGY REPORT*  Clinical Data:  Chest pain and dizziness.  CHEST - 2 VIEW  Comparison: CTA of the chest dated 04/01/2011  Findings: The heart size and mediastinal contours are within normal limits.  Both lungs are clear.  The visualized skeletal structures are unremarkable.  IMPRESSION: No active disease.  Original Report Authenticated By: Reola Calkins, M.D.    EKG:  Rhythm: normal sinus Rate: 90 Axis: normal Intervals: normal ST segments: NS ST changes. t wave flattening laterally. No significant change from previous.  1. Chest pain       MDM  45yF with CP. Consider ACS, PE, infectious, GERD, anxiety. No PE on cta 2d ago and no significant change in symptoms. No dyspnea, hypoxia or tachycardia. Hx of gerd but says feels different. Doubt infectious or anxiety. Feel low risk for ACS. No ischemic EKG changes and trop wnl. Symptom free currently. Feel appropriate for further evaluation w/CT coronaries with prior enzyme r/o. Discussed with PA, Fayrene Helper in CDU.        Raeford Razor, MD 04/13/11 [phone]

## 2011-04-03 NOTE — ED Notes (Signed)
Pt reports having hysterectomy on 12/6, has been unable to sleep and having chest tightness, intermittent dizziness since Thursday. Went to [facility] Friday and they r/o PE. Was told to return if her symptoms returned, having the cp and dizziness again today. ekg being done at triage, no acute distress noted at triage.

## 2011-04-04 ENCOUNTER — Observation Stay (HOSPITAL_COMMUNITY): Payer: 59

## 2011-04-04 ENCOUNTER — Other Ambulatory Visit: Payer: Self-pay

## 2011-04-04 LAB — POCT I-STAT TROPONIN I
Troponin i, poc: 0 ng/mL (ref 0.00–0.08)
Troponin i, poc: 0.01 ng/mL (ref 0.00–0.08)

## 2011-04-04 MED ORDER — IOHEXOL 350 MG/ML SOLN
100.0000 mL | Freq: Once | INTRAVENOUS | Status: AC | PRN
  Administered 2011-04-04: 110 mL via INTRAVENOUS

## 2011-04-04 MED ORDER — NITROGLYCERIN 0.4 MG SL SUBL
SUBLINGUAL_TABLET | SUBLINGUAL | Status: AC
  Administered 2011-04-04: 0.4 mg via SUBLINGUAL
  Filled 2011-04-04: qty 25

## 2011-04-04 MED ORDER — METOPROLOL TARTRATE 25 MG PO TABS
ORAL_TABLET | ORAL | Status: AC
  Filled 2011-04-04: qty 2

## 2011-04-04 MED ORDER — METOPROLOL TARTRATE 1 MG/ML IV SOLN
2.5000 mg | Freq: Once | INTRAVENOUS | Status: AC
  Administered 2011-04-04: 2.5 mg via INTRAVENOUS

## 2011-04-04 MED ORDER — GI COCKTAIL ~~LOC~~
30.0000 mL | Freq: Once | ORAL | Status: AC
  Administered 2011-04-04: 30 mL via ORAL
  Filled 2011-04-04: qty 30

## 2011-04-04 MED ORDER — METOPROLOL TARTRATE 1 MG/ML IV SOLN
INTRAVENOUS | Status: AC
  Administered 2011-04-04: 2.5 mg via INTRAVENOUS
  Filled 2011-04-04: qty 10

## 2011-04-04 MED ORDER — METOPROLOL TARTRATE 25 MG PO TABS
50.0000 mg | ORAL_TABLET | Freq: Once | ORAL | Status: AC
  Administered 2011-04-04: 50 mg via ORAL

## 2011-04-04 NOTE — ED Notes (Signed)
Pt in ct with rn for card ct

## 2011-04-04 NOTE — ED Provider Notes (Signed)
Patient was signed out to me by Dr. Hyacinth Meeker. Patient's coronary CT was negative per radiology and she'll be discharged home  Toy Baker, MD 04/04/11 1203

## 2011-04-04 NOTE — ED Notes (Signed)
AM ecg completed and given to MD miller.

## 2011-04-04 NOTE — ED Notes (Signed)
Pt states "i cant tell if I am having acid reflux or chest pain". EDP made aware, new ecg taken and given to edp. Pt chest pain on right side and reproduced with touch. Will continue to monitor.

## 2011-04-04 NOTE — ED Notes (Signed)
In ct with pt, she is OVERLY TALKATIVE,  She is asked to relax and be still but continues to wiggle her feet and talk or moan or make noises. Having to repeat dosing of dye due to pt inability to understand and follow instruction

## 2011-04-04 NOTE — ED Provider Notes (Signed)
Medical screening examination/treatment/procedure(s) were conducted as a shared visit with non-physician practitioner(s) and myself.  I personally evaluated the patient during the encounter.  Please see completed H&P for this encounter.  Raeford Razor, MD 04/04/11 [phone]

## 2011-04-04 NOTE — ED Notes (Signed)
Dr Freida Busman aware of pt ct card result

## 2011-04-04 NOTE — Progress Notes (Signed)
Observation review completed. 

## 2011-04-04 NOTE — ED Provider Notes (Signed)
  Physical Exam  BP 117/69  Pulse 80  Temp(Src) 98.7 F (37.1 C) (Oral)  Resp 18  SpO2 98%  Physical Exam  ED Course  Procedures  MDM Pt has been scheduled for coronary CT this AM.  Has had negative ECG's and labs up to this point.  Change of shift care signed out to Dr. Freida Busman.      Vida Roller, MD 04/04/11 [phone]

## 2011-04-04 NOTE — ED Notes (Signed)
SANDWICH AND SODA TO PT. SHE CONTINUES TO AWAIT DISPOSITION

## 2011-04-04 NOTE — ED Notes (Signed)
Dr Reche Dixon has called study results.

## 2013-03-05 ENCOUNTER — Other Ambulatory Visit: Payer: Self-pay | Admitting: Orthopedic Surgery

## 2013-03-22 ENCOUNTER — Encounter (HOSPITAL_BASED_OUTPATIENT_CLINIC_OR_DEPARTMENT_OTHER): Payer: Self-pay | Admitting: *Deleted

## 2013-03-22 NOTE — Progress Notes (Signed)
[employer] -has no time off to come in-will have to have ekg-istat dos-to come in 2 hr early-730am-bring all meds

## 2013-04-01 ENCOUNTER — Ambulatory Visit (HOSPITAL_BASED_OUTPATIENT_CLINIC_OR_DEPARTMENT_OTHER)
Admission: RE | Admit: 2013-04-01 | Discharge: 2013-04-01 | Disposition: A | Discharge status: Home / Self Care | Payer: 59 | Source: Ambulatory Visit | Attending: Orthopedic Surgery | Admitting: Orthopedic Surgery

## 2013-04-01 ENCOUNTER — Ambulatory Visit (HOSPITAL_BASED_OUTPATIENT_CLINIC_OR_DEPARTMENT_OTHER): Payer: 59 | Admitting: Anesthesiology

## 2013-04-01 ENCOUNTER — Encounter (HOSPITAL_BASED_OUTPATIENT_CLINIC_OR_DEPARTMENT_OTHER): Payer: Self-pay

## 2013-04-01 ENCOUNTER — Encounter (HOSPITAL_BASED_OUTPATIENT_CLINIC_OR_DEPARTMENT_OTHER): Payer: 59 | Admitting: Anesthesiology

## 2013-04-01 ENCOUNTER — Encounter (HOSPITAL_BASED_OUTPATIENT_CLINIC_OR_DEPARTMENT_OTHER)
Admission: RE | Disposition: A | Discharge status: Home / Self Care | Payer: Self-pay | Source: Ambulatory Visit | Attending: Orthopedic Surgery

## 2013-04-01 DIAGNOSIS — K219 Gastro-esophageal reflux disease without esophagitis: Secondary | ICD-10-CM | POA: Insufficient documentation

## 2013-04-01 DIAGNOSIS — F411 Generalized anxiety disorder: Secondary | ICD-10-CM | POA: Insufficient documentation

## 2013-04-01 DIAGNOSIS — I10 Essential (primary) hypertension: Secondary | ICD-10-CM | POA: Insufficient documentation

## 2013-04-01 DIAGNOSIS — F909 Attention-deficit hyperactivity disorder, unspecified type: Secondary | ICD-10-CM | POA: Insufficient documentation

## 2013-04-01 DIAGNOSIS — F3289 Other specified depressive episodes: Secondary | ICD-10-CM | POA: Insufficient documentation

## 2013-04-01 DIAGNOSIS — F329 Major depressive disorder, single episode, unspecified: Secondary | ICD-10-CM | POA: Insufficient documentation

## 2013-04-01 DIAGNOSIS — K589 Irritable bowel syndrome without diarrhea: Secondary | ICD-10-CM | POA: Insufficient documentation

## 2013-04-01 DIAGNOSIS — G5600 Carpal tunnel syndrome, unspecified upper limb: Secondary | ICD-10-CM | POA: Insufficient documentation

## 2013-04-01 DIAGNOSIS — Z880 Allergy status to penicillin: Secondary | ICD-10-CM | POA: Insufficient documentation

## 2013-04-01 HISTORY — DX: Carpal tunnel syndrome, unspecified upper limb: G56.00

## 2013-04-01 HISTORY — PX: CARPAL TUNNEL RELEASE: SHX101

## 2013-04-01 LAB — POCT I-STAT, CHEM 8
BUN: 16 mg/dL (ref 6–23)
Calcium, Ion: 1.19 mmol/L (ref 1.12–1.23)
Chloride: 101 mEq/L (ref 96–112)
Creatinine, Ser: 0.9 mg/dL (ref 0.50–1.10)
Glucose, Bld: 106 mg/dL — ABNORMAL HIGH (ref 70–99)
HCT: 44 % (ref 36.0–46.0)
Hemoglobin: 15 g/dL (ref 12.0–15.0)
Potassium: 3.5 mEq/L (ref 3.5–5.1)
Sodium: 141 mEq/L (ref 135–145)
TCO2: 28 mmol/L (ref 0–100)

## 2013-04-01 SURGERY — CARPAL TUNNEL RELEASE
Anesthesia: General | Site: Hand | Laterality: Bilateral

## 2013-04-01 MED ORDER — CHLORHEXIDINE GLUCONATE 4 % EX LIQD: 60.0000 mL | Freq: Once | CUTANEOUS | Status: DC

## 2013-04-01 MED ORDER — OXYCODONE-ACETAMINOPHEN 5-325 MG PO TABS: ORAL_TABLET | ORAL | Status: DC

## 2013-04-01 MED ORDER — FENTANYL CITRATE 0.05 MG/ML IJ SOLN
INTRAMUSCULAR | Status: DC | PRN
  Administered 2013-04-01: 100 ug via INTRAVENOUS
  Administered 2013-04-01: 25 ug via INTRAVENOUS

## 2013-04-01 MED ORDER — MIDAZOLAM HCL 2 MG/2ML IJ SOLN: 1.0000 mg | INTRAMUSCULAR | Status: DC | PRN

## 2013-04-01 MED ORDER — BUPIVACAINE HCL (PF) 0.25 % IJ SOLN
INTRAMUSCULAR | Status: DC | PRN
  Administered 2013-04-01: 19 mL

## 2013-04-01 MED ORDER — VANCOMYCIN HCL IN DEXTROSE 1-5 GM/200ML-% IV SOLN
INTRAVENOUS | Status: AC
  Filled 2013-04-01: qty 200

## 2013-04-01 MED ORDER — LACTATED RINGERS IV SOLN
INTRAVENOUS | Status: DC
  Administered 2013-04-01: 09:00:00 via INTRAVENOUS

## 2013-04-01 MED ORDER — OXYCODONE HCL 5 MG/5ML PO SOLN: 5.0000 mg | Freq: Once | ORAL | Status: DC | PRN

## 2013-04-01 MED ORDER — FENTANYL CITRATE 0.05 MG/ML IJ SOLN: 50.0000 ug | INTRAMUSCULAR | Status: DC | PRN

## 2013-04-01 MED ORDER — DEXAMETHASONE SODIUM PHOSPHATE 4 MG/ML IJ SOLN
INTRAMUSCULAR | Status: DC | PRN
  Administered 2013-04-01: 10 mg via INTRAVENOUS

## 2013-04-01 MED ORDER — MIDAZOLAM HCL 2 MG/2ML IJ SOLN
INTRAMUSCULAR | Status: AC
  Filled 2013-04-01: qty 2

## 2013-04-01 MED ORDER — 0.9 % SODIUM CHLORIDE (POUR BTL) OPTIME
TOPICAL | Status: DC | PRN
  Administered 2013-04-01: 200 mL

## 2013-04-01 MED ORDER — MIDAZOLAM HCL 5 MG/5ML IJ SOLN
INTRAMUSCULAR | Status: DC | PRN
  Administered 2013-04-01: 2 mg via INTRAVENOUS

## 2013-04-01 MED ORDER — HYDROMORPHONE HCL PF 1 MG/ML IJ SOLN
0.2500 mg | INTRAMUSCULAR | Status: DC | PRN
  Administered 2013-04-01: 0.5 mg via INTRAVENOUS

## 2013-04-01 MED ORDER — HYDROMORPHONE HCL PF 1 MG/ML IJ SOLN
INTRAMUSCULAR | Status: AC
  Filled 2013-04-01: qty 1

## 2013-04-01 MED ORDER — BUPIVACAINE HCL (PF) 0.25 % IJ SOLN
INTRAMUSCULAR | Status: AC
  Filled 2013-04-01: qty 30

## 2013-04-01 MED ORDER — OXYCODONE HCL 5 MG PO TABS: 5.0000 mg | ORAL_TABLET | Freq: Once | ORAL | Status: DC | PRN

## 2013-04-01 MED ORDER — PROMETHAZINE HCL 25 MG/ML IJ SOLN: 6.2500 mg | INTRAMUSCULAR | Status: DC | PRN

## 2013-04-01 MED ORDER — FENTANYL CITRATE 0.05 MG/ML IJ SOLN
INTRAMUSCULAR | Status: AC
  Filled 2013-04-01: qty 4

## 2013-04-01 MED ORDER — KETOROLAC TROMETHAMINE 30 MG/ML IJ SOLN
INTRAMUSCULAR | Status: DC | PRN
  Administered 2013-04-01: 30 mg via INTRAVENOUS

## 2013-04-01 MED ORDER — VANCOMYCIN HCL IN DEXTROSE 1-5 GM/200ML-% IV SOLN
1000.0000 mg | INTRAVENOUS | Status: AC
  Administered 2013-04-01: 1000 mg via INTRAVENOUS

## 2013-04-01 MED ORDER — PROPOFOL 10 MG/ML IV BOLUS
INTRAVENOUS | Status: DC | PRN
  Administered 2013-04-01: 200 mg via INTRAVENOUS

## 2013-04-01 MED ORDER — LIDOCAINE HCL (CARDIAC) 20 MG/ML IV SOLN
INTRAVENOUS | Status: DC | PRN
  Administered 2013-04-01: 50 mg via INTRAVENOUS

## 2013-04-01 SURGICAL SUPPLY — 37 items
BANDAGE ELASTIC 3 VELCRO ST LF (GAUZE/BANDAGES/DRESSINGS) ×4 IMPLANT
BLADE MINI RND TIP GREEN BEAV (BLADE) IMPLANT
BLADE SURG 15 STRL LF DISP TIS (BLADE) ×2 IMPLANT
BLADE SURG 15 STRL SS (BLADE) ×2
BNDG ESMARK 4X9 LF (GAUZE/BANDAGES/DRESSINGS) ×2 IMPLANT
BNDG GAUZE ELAST 4 BULKY (GAUZE/BANDAGES/DRESSINGS) ×4 IMPLANT
CHLORAPREP W/TINT 26ML (MISCELLANEOUS) ×4 IMPLANT
CORDS BIPOLAR (ELECTRODE) ×2 IMPLANT
COVER MAYO STAND STRL (DRAPES) ×4 IMPLANT
COVER TABLE BACK 60X90 (DRAPES) ×2 IMPLANT
CUFF TOURNIQUET SINGLE 18IN (TOURNIQUET CUFF) ×4 IMPLANT
DRAPE EXTREMITY T 121X128X90 (DRAPE) ×4 IMPLANT
DRAPE SURG 17X23 STRL (DRAPES) ×4 IMPLANT
GAUZE XEROFORM 1X8 LF (GAUZE/BANDAGES/DRESSINGS) IMPLANT
GLOVE BIO SURGEON STRL SZ 6.5 (GLOVE) ×2 IMPLANT
GLOVE BIO SURGEON STRL SZ7.5 (GLOVE) ×2 IMPLANT
GLOVE BIOGEL PI IND STRL 7.0 (GLOVE) ×1 IMPLANT
GLOVE BIOGEL PI IND STRL 8 (GLOVE) ×1 IMPLANT
GLOVE BIOGEL PI INDICATOR 7.0 (GLOVE) ×1
GLOVE BIOGEL PI INDICATOR 8 (GLOVE) ×1
GLOVE EXAM NITRILE LRG STRL (GLOVE) ×6 IMPLANT
GOWN BRE IMP PREV XXLGXLNG (GOWN DISPOSABLE) ×2 IMPLANT
GOWN PREVENTION PLUS XLARGE (GOWN DISPOSABLE) ×2 IMPLANT
NEEDLE HYPO 25X1 1.5 SAFETY (NEEDLE) ×2 IMPLANT
NS IRRIG 1000ML POUR BTL (IV SOLUTION) ×2 IMPLANT
PACK BASIN DAY SURGERY FS (CUSTOM PROCEDURE TRAY) ×2 IMPLANT
PAD ABD 8X10 STRL (GAUZE/BANDAGES/DRESSINGS) ×4 IMPLANT
PADDING CAST ABS 4INX4YD NS (CAST SUPPLIES)
PADDING CAST ABS COTTON 4X4 ST (CAST SUPPLIES) IMPLANT
SPONGE GAUZE 4X4 12PLY (GAUZE/BANDAGES/DRESSINGS) ×4 IMPLANT
STOCKINETTE 4X48 STRL (DRAPES) ×4 IMPLANT
SUT ETHILON 4 0 PS 2 18 (SUTURE) IMPLANT
SUT MON AB 5-0 P3 18 (SUTURE) ×2 IMPLANT
SYR BULB 3OZ (MISCELLANEOUS) ×2 IMPLANT
SYR CONTROL 10ML LL (SYRINGE) ×2 IMPLANT
TOWEL OR 17X24 6PK STRL BLUE (TOWEL DISPOSABLE) ×4 IMPLANT
UNDERPAD 30X30 INCONTINENT (UNDERPADS AND DIAPERS) ×4 IMPLANT

## 2013-04-01 NOTE — Anesthesia Preprocedure Evaluation (Signed)
Anesthesia Evaluation  Patient identified by MRN, date of birth, ID band  Reviewed: Allergy & Precautions, H&P , NPO status , Patient's Chart, lab work & pertinent test results  History of Anesthesia Complications Negative for: history of anesthetic complications  Airway Mallampati: I      Dental  (+) Teeth Intact   Pulmonary neg pulmonary ROS,  breath sounds clear to auscultation        Cardiovascular hypertension, Rhythm:Regular Rate:Normal     Neuro/Psych Seizures -,     GI/Hepatic GERD-  ,  Endo/Other    Renal/GU      Musculoskeletal   Abdominal   Peds  Hematology   Anesthesia Other Findings   Reproductive/Obstetrics                           Anesthesia Physical Anesthesia Plan  ASA: II  Anesthesia Plan: General   Post-op Pain Management:    Induction: Intravenous  Airway Management Planned: LMA  Additional Equipment:   Intra-op Plan:   Post-operative Plan: Extubation in OR  Informed Consent: I have reviewed the patients History and Physical, chart, labs and discussed the procedure including the risks, benefits and alternatives for the proposed anesthesia with the patient or authorized representative who has indicated his/her understanding and acceptance.   Dental advisory given  Plan Discussed with: CRNA and Surgeon  Anesthesia Plan Comments:         Anesthesia Quick Evaluation

## 2013-04-01 NOTE — Anesthesia Procedure Notes (Signed)
Procedure Name: LMA Insertion Performed by: Colt Grice Pre-anesthesia Checklist: Patient identified, Timeout performed, Emergency Drugs available, Suction available and Patient being monitored Patient Re-evaluated:Patient Re-evaluated prior to inductionOxygen Delivery Method: Circle system utilized Intubation Type: IV induction Ventilation: Mask ventilation without difficulty LMA: LMA with gastric port inserted LMA Size: 4.0 Number of attempts: 1 Placement Confirmation: breath sounds checked- equal and bilateral and positive ETCO2 Tube secured with: Tape Dental Injury: Teeth and Oropharynx as per pre-operative assessment

## 2013-04-01 NOTE — Brief Op Note (Signed)
04/01/2013  11:02 AM  PATIENT:  Patricia Arroyo  47 y.o. female  PRE-OPERATIVE DIAGNOSIS:  BILATERAL CARPAL TUNNEL SYNDROME  POST-OPERATIVE DIAGNOSIS:  BILATERAL CARPAL TUNNEL SYNDROME  PROCEDURE:  Procedure(s): BILATERAL CARPAL TUNNEL RELEASE (Bilateral)  SURGEON:  Surgeon(s) and Role:    * Tami Ribas, MD - Primary  PHYSICIAN ASSISTANT:   ASSISTANTS: none   ANESTHESIA:   general  EBL:  Total I/O In: 1000 [I.V.:1000] Out: -   BLOOD ADMINISTERED:none  DRAINS: none   LOCAL MEDICATIONS USED:  MARCAINE     SPECIMEN:  No Specimen  DISPOSITION OF SPECIMEN:  N/A  COUNTS:  YES  TOURNIQUET:   Total Tourniquet Time Documented: Upper Arm (N/A) - 29 minutes Upper Arm (N/A) - 28 minutes Total: Upper Arm (N/A) - 57 minutes   DICTATION: .Other Dictation: Dictation Number [id number]  PLAN OF CARE: Discharge to home after PACU  PATIENT DISPOSITION:  PACU - hemodynamically stable.

## 2013-04-01 NOTE — Op Note (Signed)
[id number] 

## 2013-04-01 NOTE — Anesthesia Postprocedure Evaluation (Signed)
  Anesthesia Post-op Note  Patient: Patricia Arroyo  Procedure(s) Performed: Procedure(s): BILATERAL CARPAL TUNNEL RELEASE (Bilateral)  Patient Location: PACU  Anesthesia Type:General  Level of Consciousness: awake  Airway and Oxygen Therapy: Patient Spontanous Breathing  Post-op Pain: mild  Post-op Assessment: Post-op Vital signs reviewed  Post-op Vital Signs: stable  Complications: No apparent anesthesia complications

## 2013-04-01 NOTE — Transfer of Care (Signed)
Immediate Anesthesia Transfer of Care Note  Patient: Patricia Arroyo  Procedure(s) Performed: Procedure(s): BILATERAL CARPAL TUNNEL RELEASE (Bilateral)  Patient Location: PACU  Anesthesia Type:General  Level of Consciousness: awake and alert   Airway & Oxygen Therapy: Patient Spontanous Breathing and Patient connected to face mask oxygen  Post-op Assessment: Report given to PACU RN and Post -op Vital signs reviewed and stable  Post vital signs: Reviewed and stable  Complications: No apparent anesthesia complications

## 2013-04-01 NOTE — H&P (Signed)
Patricia Arroyo is an 47 y.o. female.   Chief Complaint: bilateral carpal tunnel syndrome HPI: 47 yo rhd female with 10+ years of bilateral carpal tunnel syndrome.  She has nocturnal symptoms that wake her nightly.  She has worn splints with moderate relief.  She has symptoms when she drives.  Positive nerve conduction studies.  Past Medical History  Diagnosis Date  . Hypertension   . GERD (gastroesophageal reflux disease)   . Depression     h/o---no meds now  . ADHD (attention deficit hyperactivity disorder)   . IBS (irritable bowel syndrome)   . Anxiety   . Carpal tunnel syndrome     Past Surgical History  Procedure Laterality Date  . Foot fasciotomy  2005    both feet  . Dilation and curettage of uterus    . Novasure ablation    . Wisdom tooth extraction    . Colonoscopy    . Laparoscopic assisted vaginal hysterectomy  03/10/2011    Procedure: LAPAROSCOPIC ASSISTED VAGINAL HYSTERECTOMY;  Surgeon: Turner Daniels, MD;  Location: [facility];  Service: Gynecology;  Laterality: N/A;  . Salpingoophorectomy  03/10/2011    Procedure: SALPINGO OOPHERECTOMY;  Surgeon: Turner Daniels, MD;  Location: [facility];  Service: Gynecology;  Laterality: Bilateral;  . Anterior and posterior repair  03/10/2011    Procedure: ANTERIOR (CYSTOCELE) AND POSTERIOR REPAIR (RECTOCELE);  Surgeon: Turner Daniels, MD;  Location: [facility];  Service: Gynecology;  Laterality: N/A;  with Sacrospinous Ligament Suspension    History reviewed. No pertinent family history. Social History:  reports that she has never smoked. She does not have any smokeless tobacco history on file. She reports that she drinks alcohol. She reports that she does not use illicit drugs.  Allergies:  Allergies  Allergen Reactions  . Adhesive [Tape] Hives and Other (See Comments)    Pt states that she is allergic to adhesive on Band-Aids.  . Penicillins Rash  . Sulfa Antibiotics Rash    Medications Prior to Admission  Medication Sig Dispense Refill  .  estradiol (ESTRACE) 2 MG tablet Take 1 tablet (2 mg total) by mouth daily.  30 tablet  12  . ibuprofen (ADVIL,MOTRIN) 200 MG tablet Take 200-800 mg by mouth every 6 (six) hours as needed. For pain       . lisinopril (PRINIVIL,ZESTRIL) 20 MG tablet Take 10 mg by mouth daily.       Marland Kitchen omeprazole (PRILOSEC) 40 MG capsule Take 40 mg by mouth daily.        Marland Kitchen triamterene-hydrochlorothiazide (MAXZIDE-25) 37.5-25 MG per tablet Take 1 tablet by mouth daily.        Marland Kitchen zolpidem (AMBIEN) 10 MG tablet Take 10 mg by mouth at bedtime as needed. sleep         No results found for this or any previous visit (from the past 48 hour(s)).  No results found.   A comprehensive review of systems was negative except for: Eyes: positive for contacts/glasses  Height 5\' 2"  (1.575 m), weight 214 lb (97.07 kg).  General appearance: alert, cooperative and appears stated age Head: Normocephalic, without obvious abnormality, atraumatic Neck: supple, symmetrical, trachea midline Resp: clear to auscultation bilaterally Cardio: regular rate and rhythm GI: non tender Extremities: sensation and capillary refill all fingertips.  +epl/fpl/io.   Pulses: 2+ and symmetric Skin: Skin color, texture, turgor normal. No rashes or lesions Neurologic: Grossly normal Incision/Wound: None   Assessment/Plan Bilateral carpal tunnel syndrome.  Non operative and operative treatment options  were discussed with the patient and patient wishes to proceed with operative treatment. Risks, benefits, and alternatives of surgery were discussed and the patient agrees with the plan of care.   Patricia Arroyo 04/01/2013, 8:24 AM

## 2013-04-02 ENCOUNTER — Encounter (HOSPITAL_BASED_OUTPATIENT_CLINIC_OR_DEPARTMENT_OTHER): Payer: Self-pay | Admitting: Orthopedic Surgery

## 2013-04-02 NOTE — Op Note (Signed)
Patricia Arroyo, Patricia Arroyo                  ACCOUNT NO.:  [id number]  MEDICAL RECORD NO.:  [id number]  LOCATION:                                 FACILITY:  PHYSICIAN:  Betha Loa, MD             DATE OF BIRTH:  DATE OF PROCEDURE:  04/01/2013 DATE OF DISCHARGE:                              OPERATIVE REPORT   PREOPERATIVE DIAGNOSIS:  Bilateral carpal tunnel syndrome.  POSTOPERATIVE DIAGNOSIS:  Bilateral carpal tunnel syndrome.  PROCEDURE:  Bilateral carpal tunnel release.  SURGEON:  Betha Loa, MD  ASSISTANT:  None.  ANESTHESIA:  General.  IV FLUIDS:  Per Anesthesia flow sheet.  ESTIMATED BLOOD LOSS:  Minimal.  COMPLICATIONS:  None.  SPECIMENS:  None.  TOURNIQUET TIME:  Left arm, 28 minutes.  Right arm, 27 minutes.  DISPOSITION:  Stable to PACU.  INDICATIONS:  Patricia Arroyo is a 47 year old right-hand-dominant female who has a 10-year history of bilateral carpal tunnel syndrome.  She has positive nerve conduction studies.  She has nocturnal symptoms nightly which wakes her up.  She wished to have bilateral carpal tunnel release. Risks, benefits, and alternatives of surgery were discussed including risk of blood loss; infection; damage to nerves, vessels, tendons, ligaments, bone; failure of surgery; need for additional surgery; complications with wound healing; continued pain; continued carpal tunnel syndrome.  She voiced understanding of these risks and elected to proceed.  OPERATIVE COURSE:  After being identified preoperatively by myself, the patient and I agreed upon the procedure and site of the procedure. Surgical site was marked.  Risks, benefits, and alternatives of surgery were reviewed and she wished to proceed.  Surgical consent had been signed.  She was given IV vancomycin as preoperative antibiotic prophylaxis due to penicillin allergy.  She was transferred to the operating room and placed on the operating table in supine position with the upper extremities on  arm boards.  General anesthesia induced by anesthesiologist.  Bilateral upper extremities were prepped and draped in normal sterile orthopedic fashion.  Surgical pause was performed between surgeons, anesthesia, operating staff, and all were in agreement as to the patient, procedure, and site of procedure.  Left side was addressed first.  Tourniquet at the proximal aspect of the extremity was inflated to 250 mmHg after exsanguination of the limb with Esmarch bandage.  An incision was made over the transverse carpal ligament and carried into subcutaneous tissues by spreading technique.  Bipolar electrocautery was used to obtain hemostasis.  The palmar fascia was sharply incised.  The transverse carpal ligament was identified and incised sharply.  It was incised distally first.  Care was taken to ensure complete decompression distally which was the case.  The ligament was then incised proximally.  The scissor was used to split the distal aspect of the volar antebrachial fascia.  A finger was placed into the wound to ensure complete decompression.  The wound was inspected.  The nerve was flattened and then hyperemic.  The motor branch was identified and was intact.  The wound was copiously irrigated with sterile saline.  It was then closed with a running subcuticular 5-0 Monocryl suture, and this was  augmented with Dermabond skin glue.  Once the Dermabond had dried, the wound was dressed with sterile 4x4s and ABD and wrapped with Kerlix and Ace bandage.  Tourniquet was deflated at 27 minutes.  Fingertips were pink with brisk capillary refill after deflation of tourniquet. The identical procedure was performed on the right side with the exception that a forearm tourniquet was used and the tourniquet time was 28 minutes.  Again, the nerve was flat and hyperemic and the motor branch was identified and was intact.  The wound was dressed identically.  The tourniquet was deflated.  The operative  drapes were broken down.  The patient was awoken from anesthesia safely.  She was transferred back to stretcher and taken to the PACU in stable condition. I will see her back in the office in 1 week for postoperative followup. I will give her Percocet 5/325, 1 to 2 p.o. q.6 hours p.r.n. pain, dispensed #30.     Betha Loa, MD     KK/MEDQ  D:  04/01/2013  T:  04/02/2013  Job:  [id number]

## 2013-07-23 ENCOUNTER — Ambulatory Visit: Payer: 59 | Admitting: Internal Medicine

## 2013-07-23 DIAGNOSIS — Z0289 Encounter for other administrative examinations: Secondary | ICD-10-CM

## 2013-11-18 ENCOUNTER — Other Ambulatory Visit: Payer: Self-pay | Admitting: Obstetrics and Gynecology

## 2013-11-19 LAB — CYTOLOGY - PAP

## 2014-01-24 ENCOUNTER — Other Ambulatory Visit: Payer: Self-pay | Admitting: *Deleted

## 2014-01-24 DIAGNOSIS — I83813 Varicose veins of bilateral lower extremities with pain: Secondary | ICD-10-CM

## 2014-01-31 ENCOUNTER — Encounter: Payer: Self-pay | Admitting: Vascular Surgery

## 2014-02-03 ENCOUNTER — Encounter: Payer: Self-pay | Admitting: Vascular Surgery

## 2014-02-03 ENCOUNTER — Ambulatory Visit (HOSPITAL_COMMUNITY)
Admission: RE | Admit: 2014-02-03 | Discharge: 2014-02-03 | Disposition: A | Discharge status: Home / Self Care | Payer: 59 | Source: Ambulatory Visit | Attending: Vascular Surgery | Admitting: Vascular Surgery

## 2014-02-03 ENCOUNTER — Ambulatory Visit (INDEPENDENT_AMBULATORY_CARE_PROVIDER_SITE_OTHER): Payer: 59 | Admitting: Vascular Surgery

## 2014-02-03 ENCOUNTER — Other Ambulatory Visit: Payer: Self-pay | Admitting: *Deleted

## 2014-02-03 DIAGNOSIS — I83893 Varicose veins of bilateral lower extremities with other complications: Secondary | ICD-10-CM

## 2014-02-03 DIAGNOSIS — I8390 Asymptomatic varicose veins of unspecified lower extremity: Secondary | ICD-10-CM | POA: Insufficient documentation

## 2014-02-03 DIAGNOSIS — I83899 Varicose veins of unspecified lower extremities with other complications: Secondary | ICD-10-CM | POA: Insufficient documentation

## 2014-02-03 NOTE — Progress Notes (Signed)
Subjective:     Patient ID: Patricia Arroyo, female   DOB: 1965-05-24, 48 y.o.   MRN: [id number]  HPIthis 48 year old female was referred by Dr. Candice Campavid Lowe for evaluation of painful varicosities in both lower extremities. Had these varicosities for many years. She works on her feet all day and she developed severe aching burning and throbbing discomfort in both calves which worsens as the day progresses. She does develop edema in both ankles. The left leg is worse in the right. She has no history of DVT thrombophlebitis stasis ulcers or bleeding. She does not were long light elastic compression stockings but does wear short-leg stockings which slightly improve her symptoms but do not relieve them.  Past Medical History  Diagnosis Date  . Hypertension   . GERD (gastroesophageal reflux disease)   . Depression     h/o---no meds now  . ADHD (attention deficit hyperactivity disorder)   . IBS (irritable bowel syndrome)   . Anxiety   . Carpal tunnel syndrome     History  Substance Use Topics  . Smoking status: Never Smoker   . Smokeless tobacco: Not on file  . Alcohol Use: Yes     Comment: occasionally    History reviewed. No pertinent family history.  Allergies  Allergen Reactions  . Adhesive [Tape] Hives and Other (See Comments)    Pt states that she is allergic to adhesive on Band-Aids.  . Penicillins Rash  . Sulfa Antibiotics Rash    Current outpatient prescriptions: ibuprofen (ADVIL,MOTRIN) 200 MG tablet, Take 200-800 mg by mouth every 6 (six) hours as needed. For pain , Disp: , Rfl: ;  omeprazole (PRILOSEC) 40 MG capsule, Take 40 mg by mouth daily.  , Disp: , Rfl: ;  triamterene-hydrochlorothiazide (MAXZIDE-25) 37.5-25 MG per tablet, Take 1 tablet by mouth daily.  , Disp: , Rfl:  estradiol (ESTRACE) 2 MG tablet, Take 1 tablet (2 mg total) by mouth daily., Disp: 30 tablet, Rfl: 12;  lisinopril (PRINIVIL,ZESTRIL) 20 MG tablet, Take 10 mg by mouth daily. , Disp: , Rfl: ;   oxyCODONE-acetaminophen (PERCOCET) 5-325 MG per tablet, 1-2 tabs po q6 hours prn pain, Disp: 30 tablet, Rfl: 0;  zolpidem (AMBIEN) 10 MG tablet, Take 10 mg by mouth at bedtime as needed. sleep , Disp: , Rfl:   BP 122/84 mmHg  Pulse 77  Resp 16  Ht 5\' 2"  (1.575 m)  Wt 218 lb (98.884 kg)  BMI 39.86 kg/m2  Body mass index is 39.86 kg/(m^2).           Review of Systems Denies chest pain, dyspnea on exertion, PND, orthopnea, hemoptysis, claudication. All systems negative and a complete review of systems other than history of present illness     Objective:   Physical Exam BP 122/84 mmHg  Pulse 77  Resp 16  Ht 5\' 2"  (1.575 m)  Wt 218 lb (98.884 kg)  BMI 39.86 kg/m2  Gen.-alert and oriented x3 in no apparent distress HEENT normal for age Lungs no rhonchi or wheezing Cardiovascular regular rhythm no murmurs carotid pulses 3+ palpable no bruits audible Abdomen soft nontender no palpable masses Musculoskeletal free of  major deformities Skin clear -no rashes Neurologic normal Lower extremities 3+ femoral and dorsalis pedis pulses palpable bilaterally with 1+ edema bilaterally Left leg with large bulging varicosities in posterior calf and medial calf and distal medial thigh-no active ulceration noted Right leg with bulging varicosities medial calf and prominent reticular veins posterior calf.  Today I performed a bedside  SonoSite ultrasound exam which revealed gross reflux and large caliber great saphenous veins bilaterally       Assessment:     Painful varicosities due to gross reflux bilateral great saphenous veins which are currently causing severe symptoms affecting patient's daily living and ability to work     Plan:         #1 long leg elastic compression stockings 20-30 mm gradient #2 elevate legs as much as possible #3 ibuprofen daily on a regular basis for pain #4 return in 3 months-if no significant improvement then she will need number-one laser ablation  left great saphenous vein plus greater than 20 stab phlebectomy followed by #2 laser ablation right great saphenous vein with possible stab phlebectomy and/or sclerotherapy Patient will return in 3 months and we will obtain formal venous duplex exam on return

## 2014-03-13 ENCOUNTER — Encounter (HOSPITAL_COMMUNITY): Payer: 59

## 2014-03-13 ENCOUNTER — Encounter: Payer: 59 | Admitting: Vascular Surgery

## 2014-04-17 ENCOUNTER — Encounter: Payer: Self-pay | Admitting: Internal Medicine

## 2014-04-17 ENCOUNTER — Ambulatory Visit (INDEPENDENT_AMBULATORY_CARE_PROVIDER_SITE_OTHER): Payer: 59 | Admitting: Internal Medicine

## 2014-04-17 ENCOUNTER — Other Ambulatory Visit (INDEPENDENT_AMBULATORY_CARE_PROVIDER_SITE_OTHER): Payer: 59

## 2014-04-17 VITALS — BP 138/80 | HR 81 | Temp 98.3°F | Resp 18 | Ht 62.0 in | Wt 216.1 lb

## 2014-04-17 DIAGNOSIS — I10 Essential (primary) hypertension: Secondary | ICD-10-CM

## 2014-04-17 DIAGNOSIS — K219 Gastro-esophageal reflux disease without esophagitis: Secondary | ICD-10-CM

## 2014-04-17 DIAGNOSIS — K589 Irritable bowel syndrome without diarrhea: Secondary | ICD-10-CM

## 2014-04-17 DIAGNOSIS — E669 Obesity, unspecified: Secondary | ICD-10-CM

## 2014-04-17 LAB — BASIC METABOLIC PANEL
BUN: 16 mg/dL (ref 6–23)
CO2: 31 mEq/L (ref 19–32)
Calcium: 9.3 mg/dL (ref 8.4–10.5)
Chloride: 102 mEq/L (ref 96–112)
Creatinine, Ser: 0.98 mg/dL (ref 0.40–1.20)
GFR: 63.63 mL/min (ref >60.00)
Glucose, Bld: 120 mg/dL — ABNORMAL HIGH (ref 70–99)
Potassium: 3.7 mEq/L (ref 3.5–5.1)
Sodium: 138 mEq/L (ref 135–145)

## 2014-04-17 LAB — LIPID PANEL
Cholesterol: 189 mg/dL (ref 0–200)
HDL: 53.4 mg/dL (ref >39.00)
LDL Cholesterol: 108 mg/dL — ABNORMAL HIGH (ref 0–99)
NonHDL: 135.6
Total CHOL/HDL Ratio: 4
Triglycerides: 138 mg/dL (ref 0.0–149.0)
VLDL: 27.6 mg/dL (ref 0.0–40.0)

## 2014-04-17 LAB — HEMOGLOBIN A1C: Hgb A1c MFr Bld: 6.5 % (ref 4.6–6.5)

## 2014-04-17 MED ORDER — LORCASERIN HCL 10 MG PO TABS: 10.0000 mg | ORAL_TABLET | Freq: Two times a day (BID) | ORAL | Status: DC

## 2014-04-17 NOTE — Progress Notes (Signed)
Pre visit review using our clinic review tool, if applicable. No additional management support is needed unless otherwise documented below in the visit note. 

## 2014-04-17 NOTE — Patient Instructions (Signed)
We will have you try a medicine caled belviq for weight loss. We will see you back in 2-3 months so we can see if it is helping you.   We will send in a medicine for your nose. Use 2 puffs each nostril once a day. It may take 1-2 weeks until you notice the full benefit.   We will check your blood work today and call you back with the results.   Health Maintenance Adopting a healthy lifestyle and getting preventive care can go a long way to promote health and wellness. Talk with your health care provider about what schedule of regular examinations is right for you. This is a good chance for you to check in with your provider about disease prevention and staying healthy. In between checkups, there are plenty of things you can do on your own. Experts have done a lot of research about which lifestyle changes and preventive measures are most likely to keep you healthy. Ask your health care provider for more information. WEIGHT AND DIET  Eat a healthy diet  Be sure to include plenty of vegetables, fruits, low-fat dairy products, and lean protein.  Do not eat a lot of foods high in solid fats, added sugars, or salt.  Get regular exercise. This is one of the most important things you can do for your health.  Most adults should exercise for at least 150 minutes each week. The exercise should increase your heart rate and make you sweat (moderate-intensity exercise).  Most adults should also do strengthening exercises at least twice a week. This is in addition to the moderate-intensity exercise.  Maintain a healthy weight  Body mass index (BMI) is a measurement that can be used to identify possible weight problems. It estimates body fat based on height and weight. Your health care provider can help determine your BMI and help you achieve or maintain a healthy weight.  For females 29 years of age and older:   A BMI below 18.5 is considered underweight.  A BMI of 18.5 to 24.9 is normal.  A BMI of  25 to 29.9 is considered overweight.  A BMI of 30 and above is considered obese.  Watch levels of cholesterol and blood lipids  You should start having your blood tested for lipids and cholesterol at 51 years of age, then have this test every 5 years.  You may need to have your cholesterol levels checked more often if:  Your lipid or cholesterol levels are high.  You are older than 51 years of age.  You are at high risk for heart disease.  CANCER SCREENING   Lung Cancer  Lung cancer screening is recommended for adults 34-82 years old who are at high risk for lung cancer because of a history of smoking.  A yearly low-dose CT scan of the lungs is recommended for people who:  Currently smoke.  Have quit within the past 15 years.  Have at least a 30-pack-year history of smoking. A pack year is smoking an average of one pack of cigarettes a day for 1 year.  Yearly screening should continue until it has been 15 years since you quit.  Yearly screening should stop if you develop a health problem that would prevent you from having lung cancer treatment.  Breast Cancer  Practice breast self-awareness. This means understanding how your breasts normally appear and feel.  It also means doing regular breast self-exams. Let your health care provider know about any changes, no matter how  small.  If you are in your 20s or 30s, you should have a clinical breast exam (CBE) by a health care provider every 1-3 years as part of a regular health exam.  If you are 40 or older, have a CBE every year. Also consider having a breast X-ray (mammogram) every year.  If you have a family history of breast cancer, talk to your health care provider about genetic screening.  If you are at high risk for breast cancer, talk to your health care provider about having an MRI and a mammogram every year.  Breast cancer gene (BRCA) assessment is recommended for women who have family members with BRCA-related  cancers. BRCA-related cancers include:  Breast.  Ovarian.  Tubal.  Peritoneal cancers.  Results of the assessment will determine the need for genetic counseling and BRCA1 and BRCA2 testing. Cervical Cancer Routine pelvic examinations to screen for cervical cancer are no longer recommended for nonpregnant women who are considered low risk for cancer of the pelvic organs (ovaries, uterus, and vagina) and who do not have symptoms. A pelvic examination may be necessary if you have symptoms including those associated with pelvic infections. Ask your health care provider if a screening pelvic exam is right for you.   The Pap test is the screening test for cervical cancer for women who are considered at risk.  If you had a hysterectomy for a problem that was not cancer or a condition that could lead to cancer, then you no longer need Pap tests.  If you are older than 65 years, and you have had normal Pap tests for the past 10 years, you no longer need to have Pap tests.  If you have had past treatment for cervical cancer or a condition that could lead to cancer, you need Pap tests and screening for cancer for at least 20 years after your treatment.  If you no longer get a Pap test, assess your risk factors if they change (such as having a new sexual partner). This can affect whether you should start being screened again.  Some women have medical problems that increase their chance of getting cervical cancer. If this is the case for you, your health care provider may recommend more frequent screening and Pap tests.  The human papillomavirus (HPV) test is another test that may be used for cervical cancer screening. The HPV test looks for the virus that can cause cell changes in the cervix. The cells collected during the Pap test can be tested for HPV.  The HPV test can be used to screen women 20 years of age and older. Getting tested for HPV can extend the interval between normal Pap tests from  three to five years.  An HPV test also should be used to screen women of any age who have unclear Pap test results.  After 51 years of age, women should have HPV testing as often as Pap tests.  Colorectal Cancer  This type of cancer can be detected and often prevented.  Routine colorectal cancer screening usually begins at 51 years of age and continues through 51 years of age.  Your health care provider may recommend screening at an earlier age if you have risk factors for colon cancer.  Your health care provider may also recommend using home test kits to check for hidden blood in the stool.  A small camera at the end of a tube can be used to examine your colon directly (sigmoidoscopy or colonoscopy). This is done to  check for the earliest forms of colorectal cancer.  Routine screening usually begins at age 49.  Direct examination of the colon should be repeated every 5-10 years through 51 years of age. However, you may need to be screened more often if early forms of precancerous polyps or small growths are found. Skin Cancer  Check your skin from head to toe regularly.  Tell your health care provider about any new moles or changes in moles, especially if there is a change in a mole's shape or color.  Also tell your health care provider if you have a mole that is larger than the size of a pencil eraser.  Always use sunscreen. Apply sunscreen liberally and repeatedly throughout the day.  Protect yourself by wearing long sleeves, pants, a wide-brimmed hat, and sunglasses whenever you are outside. HEART DISEASE, DIABETES, AND HIGH BLOOD PRESSURE   Have your blood pressure checked at least every 1-2 years. High blood pressure causes heart disease and increases the risk of stroke.  If you are between 23 years and 32 years old, ask your health care provider if you should take aspirin to prevent strokes.  Have regular diabetes screenings. This involves taking a blood sample to check  your fasting blood sugar level.  If you are at a normal weight and have a low risk for diabetes, have this test once every three years after 51 years of age.  If you are overweight and have a high risk for diabetes, consider being tested at a younger age or more often. PREVENTING INFECTION  Hepatitis B  If you have a higher risk for hepatitis B, you should be screened for this virus. You are considered at high risk for hepatitis B if:  You were born in a country where hepatitis B is common. Ask your health care provider which countries are considered high risk.  Your parents were born in a high-risk country, and you have not been immunized against hepatitis B (hepatitis B vaccine).  You have HIV or AIDS.  You use needles to inject street drugs.  You live with someone who has hepatitis B.  You have had sex with someone who has hepatitis B.  You get hemodialysis treatment.  You take certain medicines for conditions, including cancer, organ transplantation, and autoimmune conditions. Hepatitis C  Blood testing is recommended for:  Everyone born from [birth year] through 1965.  Anyone with known risk factors for hepatitis C. Sexually transmitted infections (STIs)  You should be screened for sexually transmitted infections (STIs) including gonorrhea and chlamydia if:  You are sexually active and are younger than 51 years of age.  You are older than 51 years of age and your health care provider tells you that you are at risk for this type of infection.  Your sexual activity has changed since you were last screened and you are at an increased risk for chlamydia or gonorrhea. Ask your health care provider if you are at risk.  If you do not have HIV, but are at risk, it may be recommended that you take a prescription medicine daily to prevent HIV infection. This is called pre-exposure prophylaxis (PrEP). You are considered at risk if:  You are sexually active and do not regularly use  condoms or know the HIV status of your partner(s).  You take drugs by injection.  You are sexually active with a partner who has HIV. Talk with your health care provider about whether you are at high risk of being infected with HIV.  If you choose to begin PrEP, you should first be tested for HIV. You should then be tested every 3 months for as long as you are taking PrEP.  PREGNANCY   If you are premenopausal and you may become pregnant, ask your health care provider about preconception counseling.  If you may become pregnant, take 400 to 800 micrograms (mcg) of folic acid every day.  If you want to prevent pregnancy, talk to your health care provider about birth control (contraception). OSTEOPOROSIS AND MENOPAUSE   Osteoporosis is a disease in which the bones lose minerals and strength with aging. This can result in serious bone fractures. Your risk for osteoporosis can be identified using a bone density scan.  If you are 52 years of age or older, or if you are at risk for osteoporosis and fractures, ask your health care provider if you should be screened.  Ask your health care provider whether you should take a calcium or vitamin D supplement to lower your risk for osteoporosis.  Menopause may have certain physical symptoms and risks.  Hormone replacement therapy may reduce some of these symptoms and risks. Talk to your health care provider about whether hormone replacement therapy is right for you.  HOME CARE INSTRUCTIONS   Schedule regular health, dental, and eye exams.  Stay current with your immunizations.   Do not use any tobacco products including cigarettes, chewing tobacco, or electronic cigarettes.  If you are pregnant, do not drink alcohol.  If you are breastfeeding, limit how much and how often you drink alcohol.  Limit alcohol intake to no more than 1 drink per day for nonpregnant women. One drink equals 12 ounces of beer, 5 ounces of wine, or 1 ounces of hard  liquor.  Do not use street drugs.  Do not share needles.  Ask your health care provider for help if you need support or information about quitting drugs.  Tell your health care provider if you often feel depressed.  Tell your health care provider if you have ever been abused or do not feel safe at home. Document Released: 10/04/2010 Document Revised: 08/05/2013 Document Reviewed: 02/20/2013 [facility] Patient Information 2015 [place], [place]. This information is not intended to replace advice given to you by your health care provider. Make sure you discuss any questions you have with your health care provider.

## 2014-04-18 MED ORDER — TRIAMTERENE-HCTZ 37.5-25 MG PO TABS: 1.0000 | ORAL_TABLET | Freq: Every day | ORAL | Status: DC

## 2014-04-18 MED ORDER — LISINOPRIL 10 MG PO TABS: 10.0000 mg | ORAL_TABLET | Freq: Every day | ORAL | Status: DC

## 2014-04-20 DIAGNOSIS — K219 Gastro-esophageal reflux disease without esophagitis: Secondary | ICD-10-CM | POA: Insufficient documentation

## 2014-04-20 DIAGNOSIS — E669 Obesity, unspecified: Secondary | ICD-10-CM | POA: Insufficient documentation

## 2014-04-20 DIAGNOSIS — I10 Essential (primary) hypertension: Secondary | ICD-10-CM | POA: Insufficient documentation

## 2014-04-20 DIAGNOSIS — K589 Irritable bowel syndrome without diarrhea: Secondary | ICD-10-CM | POA: Insufficient documentation

## 2014-04-20 NOTE — Assessment & Plan Note (Signed)
Uses lomotil infrequently and doing well overall.

## 2014-04-20 NOTE — Assessment & Plan Note (Signed)
Doing well with prilosec.

## 2014-04-20 NOTE — Assessment & Plan Note (Signed)
Will trial belviq for weight loss. Return in 3 months to see if it is effective.

## 2014-04-20 NOTE — Progress Notes (Signed)
   Subjective:    Patient ID: Patricia Arroyo, female    DOB: 1965-08-20, 49 y.o.   MRN: [id number]  HPI The patient is a 49 YO female who is coming in today to establish care. She has PMH of varicose veins, HTN, GERD, IBS. She is doing well overall but struggling with her weight. She has been watching her diet and trying to exercise more but not losing weight. She denies chest pains, SOB, abdominal pain. She does very well with the IBS and uses the lomotil infrequently.   Review of Systems  Constitutional: Negative for fever, activity change, appetite change, fatigue and unexpected weight change.  HENT: Negative.   Respiratory: Negative for cough, chest tightness, shortness of breath and wheezing.   Cardiovascular: Negative for chest pain, palpitations and leg swelling.  Gastrointestinal: Negative for abdominal pain, diarrhea, constipation and abdominal distention.  Musculoskeletal: Negative.   Neurological: Negative.   Psychiatric/Behavioral: Negative.       Objective:   Physical Exam  Constitutional: She is oriented to person, place, and time. She appears well-developed and well-nourished.  HENT:  Head: Normocephalic and atraumatic.  Eyes: EOM are normal.  Neck: Normal range of motion.  Cardiovascular: Normal rate and regular rhythm.   Pulmonary/Chest: Effort normal and breath sounds normal. No respiratory distress. She has no wheezes. She has no rales.  Abdominal: Soft. Bowel sounds are normal. She exhibits no distension. There is no tenderness.  Musculoskeletal: She exhibits no edema.  Neurological: She is alert and oriented to person, place, and time. Coordination normal.  Skin: Skin is warm and dry.   Filed Vitals:   04/17/14 1607  BP: 138/80  Pulse: 81  Temp: 98.3 F (36.8 C)  TempSrc: Oral  Resp: 18  Height: 5\' 2"  (1.575 m)  Weight: 216 lb 1.9 oz (98.031 kg)  SpO2: 94%      Assessment & Plan:

## 2014-04-20 NOTE — Assessment & Plan Note (Signed)
BP well controlled on current regimen. Will check BMP and adjust as needed.

## 2014-04-22 ENCOUNTER — Other Ambulatory Visit: Payer: Self-pay

## 2014-04-22 MED ORDER — OMEPRAZOLE 40 MG PO CPDR: 40.0000 mg | DELAYED_RELEASE_CAPSULE | Freq: Every day | ORAL | Status: DC

## 2014-05-05 ENCOUNTER — Encounter: Payer: Self-pay | Admitting: Vascular Surgery

## 2014-05-06 ENCOUNTER — Ambulatory Visit: Payer: 59 | Admitting: Vascular Surgery

## 2014-05-09 ENCOUNTER — Encounter: Payer: Self-pay | Admitting: Vascular Surgery

## 2014-05-12 ENCOUNTER — Encounter: Payer: Self-pay | Admitting: Vascular Surgery

## 2014-05-12 ENCOUNTER — Ambulatory Visit (INDEPENDENT_AMBULATORY_CARE_PROVIDER_SITE_OTHER): Payer: 59 | Admitting: Vascular Surgery

## 2014-05-12 VITALS — BP 134/77 | HR 72 | Resp 16 | Ht 62.0 in | Wt 219.0 lb

## 2014-05-12 DIAGNOSIS — I83893 Varicose veins of bilateral lower extremities with other complications: Secondary | ICD-10-CM

## 2014-05-12 DIAGNOSIS — I83899 Varicose veins of unspecified lower extremities with other complications: Secondary | ICD-10-CM | POA: Insufficient documentation

## 2014-05-12 NOTE — Progress Notes (Signed)
Subjective:     Patient ID: Patricia Arroyo, female   DOB: 1965/08/09, 49 y.o.   MRN: [id number]  HPI this 49 year old female returns for further evaluation of her painful varicosities in both lower extremities. She has tried long-leg elastic compression stocking-30 mm gradient as well as elevation and ibuprofen and has had no improvement in her aching throbbing and burning discomfort left worse than right. Discomfort worsens the longer she is on her feet. She does develop edema as the day progresses. She has no history of DVT.  Past Medical History  Diagnosis Date  . Hypertension   . GERD (gastroesophageal reflux disease)   . Depression     h/o---no meds now  . ADHD (attention deficit hyperactivity disorder)   . IBS (irritable bowel syndrome)   . Anxiety   . Carpal tunnel syndrome     History  Substance Use Topics  . Smoking status: Never Smoker   . Smokeless tobacco: Not on file  . Alcohol Use: Yes     Comment: occasionally    Family History  Problem Relation Age of Onset  . Cancer Mother   . Diabetes Father   . Cancer Sister     Allergies  Allergen Reactions  . Adhesive [Tape] Hives and Other (See Comments)    Pt states that she is allergic to adhesive on Band-Aids.  . Banana     Burning sensation on tongue  . Penicillins Rash  . Sulfa Antibiotics Rash     Current outpatient prescriptions:  .  diphenoxylate-atropine (LOMOTIL) 2.5-0.025 MG per tablet, Take 1 tablet by mouth as needed for diarrhea or loose stools., Disp: , Rfl:  .  estradiol (ESTRACE) 2 MG tablet, Take 1 tablet (2 mg total) by mouth daily., Disp: 30 tablet, Rfl: 12 .  lisinopril (PRINIVIL,ZESTRIL) 10 MG tablet, Take 1 tablet (10 mg total) by mouth daily., Disp: 90 tablet, Rfl: 3 .  Lorcaserin HCl (BELVIQ) 10 MG TABS, Take 10 mg by mouth 2 (two) times daily., Disp: 60 tablet, Rfl: 3 .  omeprazole (PRILOSEC) 40 MG capsule, Take 1 capsule (40 mg total) by mouth daily., Disp: 90 capsule, Rfl: 1 .   triamterene-hydrochlorothiazide (MAXZIDE-25) 37.5-25 MG per tablet, Take 1 tablet by mouth daily., Disp: 90 tablet, Rfl: 3 .  zolpidem (AMBIEN) 10 MG tablet, Take 10 mg by mouth at bedtime as needed. sleep , Disp: , Rfl:   BP 134/77 mmHg  Pulse 72  Resp 16  Ht  (1.575 m)  Wt 219 lb (99.338 kg)  BMI 40.05 kg/m2  Body mass index is 40.05 kg/(m^2).           Review of Systems denies chest pain, dyspnea on exertion, PND, orthopnea. Does have occasional esophageal reflux symptoms. Recently had elevated A1c level.     Objective:   Physical Exam BP 134/77 mmHg  Pulse 72  Resp 16  Ht  (1.575 m)  Wt 219 lb (99.338 kg)  BMI 40.05 kg/m2  Gen. well-developed well-nourished female no apparent stress alert and oriented 3 Lungs no rhonchi or wheezing Left leg with extensive bulging varicosities in posterior and medial calf with 1+ edema distally and 3+ posterior tibial pulse palpable. Right leg with extensive reticular and spider veins with some bulging varicosities posterior calf with 1+ edema and 2+ posterior tibial pulse palpable.     Assessment:     #1 bilateral painful varicosities due to gross reflux bilateral great saphenous veins. Symptoms are resistant to conservative measures including  long-leg elastic compression stockings 20-30 millimeter gradient, elevation, and ibuprofen. Symptoms are affecting patient's daily living.    Plan:     Patient needs #1 laser ablation left great saphenous vein plus greater than 20 stab phlebectomy followed by #2 laser ablation right great saphenous vein with 2 courses of sclerotherapy We will proceed with precertification to perform this and relieve her symptoms in the near future.

## 2014-05-13 ENCOUNTER — Ambulatory Visit: Payer: 59 | Admitting: Vascular Surgery

## 2014-05-20 ENCOUNTER — Other Ambulatory Visit: Payer: Self-pay | Admitting: *Deleted

## 2014-05-20 DIAGNOSIS — I83891 Varicose veins of right lower extremities with other complications: Secondary | ICD-10-CM

## 2014-05-20 DIAGNOSIS — I83892 Varicose veins of left lower extremities with other complications: Secondary | ICD-10-CM

## 2014-05-21 ENCOUNTER — Other Ambulatory Visit: Payer: Self-pay | Admitting: *Deleted

## 2014-05-21 DIAGNOSIS — I83893 Varicose veins of bilateral lower extremities with other complications: Secondary | ICD-10-CM

## 2014-06-06 ENCOUNTER — Encounter: Payer: Self-pay | Admitting: *Deleted

## 2014-06-06 ENCOUNTER — Encounter: Payer: Self-pay | Admitting: Vascular Surgery

## 2014-06-13 ENCOUNTER — Encounter: Payer: Self-pay | Admitting: Vascular Surgery

## 2014-06-16 ENCOUNTER — Encounter: Payer: Self-pay | Admitting: Vascular Surgery

## 2014-06-16 ENCOUNTER — Ambulatory Visit (INDEPENDENT_AMBULATORY_CARE_PROVIDER_SITE_OTHER): Payer: 59 | Admitting: Vascular Surgery

## 2014-06-16 VITALS — BP 143/82 | HR 82 | Resp 18 | Ht 62.0 in | Wt 214.0 lb

## 2014-06-16 DIAGNOSIS — I83893 Varicose veins of bilateral lower extremities with other complications: Secondary | ICD-10-CM

## 2014-06-16 NOTE — Progress Notes (Signed)
Laser Ablation Procedure    Date: 06/16/2014   Patricia BurkeDonna L Arroyo DOB:02/05/1966  Consent signed: Yes    Surgeon:  Dr. Quita SkyeJames D. Hart RochesterLawson  Procedure: Laser Ablation: left Greater Saphenous Vein  BP 143/82 mmHg  Pulse 82  Resp 18  Ht 5\' 2"  (1.575 m)  Wt 214 lb (97.07 kg)  BMI 39.13 kg/m2  Tumescent Anesthesia: 450 cc 0.9% NaCl with 50 cc Lidocaine HCL with 1% Epi and 15 cc 8.4% NaHCO3  Local Anesthesia: 8 cc Lidocaine HCL and NaHCO3 (ratio 2:1)  Pulsed Mode: 15 watts, 500ms delay, 1.0 duration  Total Energy:              Total Pulses:                Total Time:     Stab Phlebectomy: >20 Sites: Calf  Patient tolerated procedure well  Notes: very nervous. Two doses of ativan given prior  Description of Procedure:  After marking the course of the secondary varicosities, the patient was placed on the operating table in the supine position, and the left leg was prepped and draped in sterile fashion.   Local anesthetic was administered and under ultrasound guidance the saphenous vein was accessed with a micro needle and guide wire; then the mirco puncture sheath was placed.  A guide wire was inserted saphenofemoral junction , followed by a 5 french sheath.  The position of the sheath and then the laser fiber below the junction was confirmed using the ultrasound.  Tumescent anesthesia was administered along the course of the saphenous vein using ultrasound guidance. The patient was placed in Trendelenburg position and protective laser glasses were placed on patient and staff, and the laser was fired at 15 watts continuous mode advancing 1-272mm/second for a total of 1370 joules.   For stab phlebectomies, local anesthetic was administered at the previously marked varicosities, and tumescent anesthesia was administered around the vessels.  Greater than 20 stab wounds were made using the tip of an 11 blade. And using the vein hook, the phlebectomies were performed using a hemostat to avulse the  varicosities.  Adequate hemostasis was achieved.     Steri strips were applied to the stab wounds and ABD pads and thigh high compression stockings were applied.  Ace wrap bandages were applied over the phlebectomy sites and at the top of the saphenofemoral junction. Blood loss was less than 15 cc.  The patient ambulated out of the operating room having tolerated the procedure well.

## 2014-06-16 NOTE — Progress Notes (Signed)
Subjective:     Patient ID: Patricia BurkeDonna L Simmering, female   DOB: 1966/02/03, 49 y.o.   MRN: [id number]  HPI this 49 year old female had laser ablation of the left great saphenous vein from the distal thigh to near the saphenofemoral junction plus greater than 20 stab phlebectomy of painful varicosities performed under local tumescent anesthesia for gross reflux. The total of 1370 J of energy was utilized. She tolerated the procedure well.   Review of Systems     Objective:   Physical Exam BP 143/82 mmHg  Pulse 82  Resp 18  Ht 5\' 2"  (1.575 m)  Wt 214 lb (97.07 kg)  BMI 39.13 kg/m2       Assessment:     Well-tolerated laser ablation left great saphenous vein plus greater than 20 stab phlebectomy of painful varicosities performed under local tumescent anesthesia    Plan:     Return in one week for venous duplex exam to confirm closure left great saphenous vein

## 2014-06-17 ENCOUNTER — Telehealth: Payer: Self-pay | Admitting: *Deleted

## 2014-06-17 NOTE — Telephone Encounter (Signed)
Pt doing well. Slept well. No bleeding. Following all instructions.

## 2014-06-18 ENCOUNTER — Encounter: Payer: Self-pay | Admitting: Vascular Surgery

## 2014-06-18 ENCOUNTER — Telehealth: Payer: Self-pay | Admitting: *Deleted

## 2014-06-18 NOTE — Telephone Encounter (Signed)
Mrs. Elyn PeersYork is removing her compression dressing this afternoon post laser ablation procedure on 06-16-2014 and has questions.  She is concerned about the steri strips on her left leg.  Instructed her to keep steri strips on for 1-2 weeks as they are taking the place of a stitch and need to remain on while the incisions heal.  She is concerned about getting them wet as she has had problems with reacting to band aides causing skin irritation in the past when they got wet.  Advised her to keep leg dry when showering by placing large plastic bag securely around her entire leg to keep the leg dry if she had concerns about skin irritation. She also was concerned about bruising on her left leg in the areas that were treated.  Reassured her that bruising was very common and within normal limits for this procedure and would resolve in 1-2 weeks.  Mrs. Pallone states that today she experienced rash over her entire body.  Denies problems swallowing or difficulty breathing.  Informed Mrs. Clermont that tumescent anesthesia given in left leg on 06-16-2014 would likely not cause rash over her entire body 2 days post procedure.  Mrs. Crissman states she has taken Benedryl this morning which is improving symptoms related to her rash.    Encouraged Mrs. Hereford to call [facility] if she has further questions or concerns.

## 2014-06-23 ENCOUNTER — Encounter: Payer: Self-pay | Admitting: Vascular Surgery

## 2014-06-24 ENCOUNTER — Encounter: Payer: Self-pay | Admitting: Vascular Surgery

## 2014-06-24 ENCOUNTER — Ambulatory Visit (HOSPITAL_COMMUNITY)
Admission: RE | Admit: 2014-06-24 | Discharge: 2014-06-24 | Disposition: A | Discharge status: Home / Self Care | Payer: 59 | Source: Ambulatory Visit | Attending: Vascular Surgery | Admitting: Vascular Surgery

## 2014-06-24 ENCOUNTER — Ambulatory Visit (INDEPENDENT_AMBULATORY_CARE_PROVIDER_SITE_OTHER): Payer: Self-pay | Admitting: Vascular Surgery

## 2014-06-24 VITALS — BP 104/67 | HR 73 | Resp 16 | Ht 62.0 in | Wt 214.0 lb

## 2014-06-24 DIAGNOSIS — I83892 Varicose veins of left lower extremities with other complications: Secondary | ICD-10-CM

## 2014-06-24 NOTE — Progress Notes (Signed)
Subjective:     Patient ID: Patricia BurkeDonna L Kling, female   DOB: 10/25/1965, 49 y.o.   MRN: [id number]  HPI this 49 year old female returns 1 week post laser ablation left great saphenous vein plus greater than 20 stab phlebectomy of painful varicosities. She has had mild to moderate discomfort in the left proximal medial thigh area. She's had no distal edema. She has worn her elastic compression stockings as instructed. She has been on prednisone because of an allergic reaction to an antibiotic prescribed by another physician.   Review of Systems     Objective:   Physical Exam BP 104/67 mmHg  Pulse 73  Resp 16  Ht 5\' 2"  (1.575 m)  Wt 214 lb (97.07 kg)  BMI 39.13 kg/m2  Gen. well-developed well-nourished female no apparent stress alert and oriented 3 Left lower extremity with moderate ecchymosis beginning and inguinal crease down the medial side of the thigh to the knee. Great saphenous vein is palpable beneath the skin. No blistering. No distal edema. Stab phlebectomy sites all healing nicely.  Today I ordered a venous duplex exam the left leg which I reviewed and interpreted. There is no DVT. There is total closure of the left great saphenous vein from the distal thigh to near the saphenofemoral junction.     Assessment:     Successful laser ablation left great saphenous vein with greater than 20 stab phlebectomy of painful varicosities    Plan:     Return April 18 for similar procedure on right leg without stab phlebectomy to be followed by 2 courses of sclerotherapy

## 2014-07-18 ENCOUNTER — Encounter: Payer: Self-pay | Admitting: Vascular Surgery

## 2014-07-21 ENCOUNTER — Ambulatory Visit (INDEPENDENT_AMBULATORY_CARE_PROVIDER_SITE_OTHER): Payer: 59 | Admitting: Vascular Surgery

## 2014-07-21 ENCOUNTER — Encounter: Payer: Self-pay | Admitting: Vascular Surgery

## 2014-07-21 VITALS — BP 143/86 | HR 85 | Resp 18 | Ht 62.0 in | Wt 212.0 lb

## 2014-07-21 DIAGNOSIS — I83893 Varicose veins of bilateral lower extremities with other complications: Secondary | ICD-10-CM

## 2014-07-21 NOTE — Progress Notes (Signed)
Laser Ablation Procedure    Date: 07/21/2014   Cleatrice BurkeDonna L Pinho DOB:06/28/1965  Consent signed: Yes    Surgeon:  Dr. Quita SkyeJames D. Hart RochesterLawson  Procedure: Laser Ablation: right Greater Saphenous Vein  BP 143/86 mmHg  Pulse 85  Resp 18  Ht 5\' 2"  (1.575 m)  Wt 212 lb (96.163 kg)  BMI 38.77 kg/m2  Tumescent Anesthesia: 300 cc 0.9% NaCl with 50 cc Lidocaine HCL with 1% Epi and 15 cc 8.4% NaHCO3  Local Anesthesia: 5 cc Lidocaine HCL and NaHCO3 (ratio 2:1)  Pulsed Mode: 15 watts, 500ms delay, 1.0 duration  Total Energy:       2110       Total Pulses:  141              Total Time: 2:21    Patient tolerated procedure well  Notes:   Description of Procedure:  After marking the course of the secondary varicosities, the patient was placed on the operating table in the supine position, and the right leg was prepped and draped in sterile fashion.   Local anesthetic was administered and under ultrasound guidance the saphenous vein was accessed with a micro needle and guide wire; then the mirco puncture sheath was placed.  A guide wire was inserted saphenofemoral junction , followed by a 5 french sheath.  The position of the sheath and then the laser fiber below the junction was confirmed using the ultrasound.  Tumescent anesthesia was administered along the course of the saphenous vein using ultrasound guidance. The patient was placed in Trendelenburg position and protective laser glasses were placed on patient and staff, and the laser was fired at 15 watts continuous mode advancing 1-152mm/second for a total of 2110 joules.     Steri strips were applied to the stab wounds and ABD pads and thigh high compression stockings were applied.  Ace wrap bandages were applied over the phlebectomy sites and at the top of the saphenofemoral junction. Blood loss was less than 15 cc.  The patient ambulated out of the operating room having tolerated the procedure well.

## 2014-07-21 NOTE — Progress Notes (Signed)
Subjective:     Patient ID: Patricia Arroyo, female   DOB: [birth year], 49 y.o.   MRN: [id number]  HPI this 49 year old female had laser ablation right great saphenous vein performed under local tumescent anesthesia. A total of 2100 J of energy was utilized. She tolerated the procedure well.   Review of Systems     Objective:   Physical Exam BP 143/86 mmHg  Pulse 85  Resp 18  Ht 5\' 2"  (1.575 m)  Wt 212 lb (96.163 kg)  BMI 38.77 kg/m2       Assessment:     Well-tolerated laser ablation right great saphenous vein performed under local tumescent anesthesia    Plan:     Return in one week for venous duplex exam to confirm closure right great saphenous vein

## 2014-07-22 ENCOUNTER — Telehealth: Payer: Self-pay | Admitting: *Deleted

## 2014-07-22 NOTE — Telephone Encounter (Signed)
Pt doing well. No pain. Following all instructions. 

## 2014-07-28 ENCOUNTER — Encounter: Payer: Self-pay | Admitting: Vascular Surgery

## 2014-07-29 ENCOUNTER — Encounter: Payer: Self-pay | Admitting: Vascular Surgery

## 2014-07-29 ENCOUNTER — Ambulatory Visit (HOSPITAL_COMMUNITY)
Admission: RE | Admit: 2014-07-29 | Discharge: 2014-07-29 | Disposition: A | Discharge status: Home / Self Care | Payer: 59 | Source: Ambulatory Visit | Attending: Vascular Surgery | Admitting: Vascular Surgery

## 2014-07-29 ENCOUNTER — Ambulatory Visit (INDEPENDENT_AMBULATORY_CARE_PROVIDER_SITE_OTHER): Payer: Self-pay | Admitting: Vascular Surgery

## 2014-07-29 VITALS — BP 115/78 | HR 90 | Resp 16 | Ht 62.0 in | Wt 214.0 lb

## 2014-07-29 DIAGNOSIS — I83893 Varicose veins of bilateral lower extremities with other complications: Secondary | ICD-10-CM

## 2014-07-29 DIAGNOSIS — Z48812 Encounter for surgical aftercare following surgery on the circulatory system: Secondary | ICD-10-CM | POA: Diagnosis not present

## 2014-07-29 DIAGNOSIS — I83892 Varicose veins of left lower extremities with other complications: Secondary | ICD-10-CM

## 2014-07-29 NOTE — Progress Notes (Signed)
Subjective:     Patient ID: Patricia Arroyo, female   DOB: July 04, 1965, 49 y.o.   MRN: [id number]  HPI this 49 year old female returns 1 week post-laser ablation right great saphenous vein from proximal calf to near the saphenofemoral junction for gross reflux with pain and swelling. She states that the leg feels much lighter than prior to the procedure. She has had some moderate discomfort particularly in the distal thigh over the great saphenous vein which is improving. He is wearing her long-leg elastic compression stockings and taking ibuprofen as instructed. She did return to work after 2 days.  Past Medical History  Diagnosis Date  . Hypertension   . GERD (gastroesophageal reflux disease)   . Depression     h/o---no meds now  . ADHD (attention deficit hyperactivity disorder)   . IBS (irritable bowel syndrome)   . Anxiety   . Carpal tunnel syndrome   . Varicose veins     History  Substance Use Topics  . Smoking status: Never Smoker   . Smokeless tobacco: Never Used  . Alcohol Use: 0.0 oz/week    0 Standard drinks or equivalent per week     Comment: occasionally    Family History  Problem Relation Age of Onset  . Cancer Mother   . Diabetes Father   . Cancer Sister     Allergies  Allergen Reactions  . Adhesive [Tape] Hives and Other (See Comments)    Pt states that she is allergic to adhesive on Band-Aids.  . Banana     Burning sensation on tongue  . Diflucan [Fluconazole] Hives  . Penicillins Rash  . Sulfa Antibiotics Rash     Current outpatient prescriptions:  .  diphenoxylate-atropine (LOMOTIL) 2.5-0.025 MG per tablet, Take 1 tablet by mouth as needed for diarrhea or loose stools., Disp: , Rfl:  .  estradiol (ESTRACE) 2 MG tablet, Take 1 tablet (2 mg total) by mouth daily., Disp: 30 tablet, Rfl: 12 .  lisinopril (PRINIVIL,ZESTRIL) 10 MG tablet, Take 1 tablet (10 mg total) by mouth daily., Disp: 90 tablet, Rfl: 3 .  Lorcaserin HCl (BELVIQ) 10 MG TABS, Take 10 mg by  mouth 2 (two) times daily., Disp: 60 tablet, Rfl: 3 .  omeprazole (PRILOSEC) 40 MG capsule, Take 1 capsule (40 mg total) by mouth daily., Disp: 90 capsule, Rfl: 1 .  triamterene-hydrochlorothiazide (MAXZIDE-25) 37.5-25 MG per tablet, Take 1 tablet by mouth daily., Disp: 90 tablet, Rfl: 3 .  zolpidem (AMBIEN) 10 MG tablet, Take 10 mg by mouth at bedtime as needed. sleep , Disp: , Rfl:   Filed Vitals:   07/29/14 0937  BP: 115/78  Pulse: 90  Resp: 16  Height:  (1.575 m)  Weight: 214 lb (97.07 kg)    Body mass index is 39.13 kg/(m^2).          Review of Systems denies chest pain, dyspnea on exertion, PND, orthopnea, hemoptysis, claudication.     Objective:   Physical Exam BP 115/78 mmHg  Pulse 90  Resp 16  Ht  (1.575 m)  Wt 214 lb (97.07 kg)  BMI 39.13 kg/m2  Gen. well-developed well-nourished female in no apparent stress alert and oriented 3 Lungs no rhonchi or wheezing Cardiovascular regular rhythm no murmurs  Right leg with mild to moderate discomfort along course of great saphenous vein with no distal edema and 3+ dorsalis pedis pulse palpable  Today I ordered a venous duplex exam of the right leg which I reviewed and interpreted.  There is no DVT. There is total closure of the right great saphenous vein up to a point near the saphenofemoral junction     Assessment:     Successful bilateral laser ablation of great saphenous veins for gross reflux with pain and swelling    Plan:     Patient to return for 1 course of sclerotherapy to complete her treatment regimen

## 2014-08-25 ENCOUNTER — Encounter: Payer: Self-pay | Admitting: *Deleted

## 2014-08-26 ENCOUNTER — Encounter: Payer: Self-pay | Admitting: *Deleted

## 2014-08-27 ENCOUNTER — Encounter: Payer: Self-pay | Admitting: Vascular Surgery

## 2014-08-27 ENCOUNTER — Ambulatory Visit (INDEPENDENT_AMBULATORY_CARE_PROVIDER_SITE_OTHER): Payer: 59 | Admitting: *Deleted

## 2014-08-27 DIAGNOSIS — I83899 Varicose veins of unspecified lower extremities with other complications: Secondary | ICD-10-CM

## 2014-08-27 DIAGNOSIS — I83893 Varicose veins of bilateral lower extremities with other complications: Secondary | ICD-10-CM

## 2014-08-27 NOTE — Progress Notes (Signed)
X=.3% Sotradecol administered with a 27g butterfly.  Patient received a total of 15cc.  Treated majority of retics and spiders. Pt very nervous and sensitive to med icine going in, Easy access. Anticipate good results. Follow prn.  Photos: No.  Compression stockings applied: Yes.

## 2014-11-07 ENCOUNTER — Other Ambulatory Visit: Payer: Self-pay | Admitting: Internal Medicine

## 2014-12-10 ENCOUNTER — Telehealth: Payer: Self-pay | Admitting: *Deleted

## 2014-12-10 MED ORDER — OMEPRAZOLE 40 MG PO CPDR: 40.0000 mg | DELAYED_RELEASE_CAPSULE | Freq: Every day | ORAL | Status: DC

## 2014-12-10 NOTE — Telephone Encounter (Signed)
Left msg on triage stating [facility] sent request to have omeprazole fill was sent back with #30 insurance requires #90. Requesting new rx to be sent for #90...Raechel Chute

## 2015-01-13 ENCOUNTER — Ambulatory Visit (INDEPENDENT_AMBULATORY_CARE_PROVIDER_SITE_OTHER): Payer: 59 | Admitting: Internal Medicine

## 2015-01-13 ENCOUNTER — Ambulatory Visit: Payer: 59 | Admitting: Internal Medicine

## 2015-01-13 ENCOUNTER — Other Ambulatory Visit (INDEPENDENT_AMBULATORY_CARE_PROVIDER_SITE_OTHER): Payer: 59

## 2015-01-13 ENCOUNTER — Encounter: Payer: Self-pay | Admitting: Internal Medicine

## 2015-01-13 VITALS — BP 136/78 | HR 84 | Temp 97.9°F | Resp 12 | Ht 62.0 in | Wt 216.0 lb

## 2015-01-13 DIAGNOSIS — I10 Essential (primary) hypertension: Secondary | ICD-10-CM | POA: Diagnosis not present

## 2015-01-13 DIAGNOSIS — E119 Type 2 diabetes mellitus without complications: Secondary | ICD-10-CM

## 2015-01-13 DIAGNOSIS — E669 Obesity, unspecified: Secondary | ICD-10-CM

## 2015-01-13 DIAGNOSIS — K760 Fatty (change of) liver, not elsewhere classified: Secondary | ICD-10-CM | POA: Diagnosis not present

## 2015-01-13 LAB — COMPREHENSIVE METABOLIC PANEL
ALT: 43 U/L — ABNORMAL HIGH (ref 0–35)
AST: 42 U/L — ABNORMAL HIGH (ref 0–37)
Albumin: 4.1 g/dL (ref 3.5–5.2)
Alkaline Phosphatase: 101 U/L (ref 39–117)
BUN: 14 mg/dL (ref 6–23)
CO2: 29 mEq/L (ref 19–32)
Calcium: 9.4 mg/dL (ref 8.4–10.5)
Chloride: 104 mEq/L (ref 96–112)
Creatinine, Ser: 0.79 mg/dL (ref 0.40–1.20)
GFR: 81.36 mL/min (ref >60.00)
Glucose, Bld: 96 mg/dL (ref 70–99)
Potassium: 3.5 mEq/L (ref 3.5–5.1)
Sodium: 142 mEq/L (ref 135–145)
Total Bilirubin: 0.4 mg/dL (ref 0.2–1.2)
Total Protein: 7 g/dL (ref 6.0–8.3)

## 2015-01-13 LAB — HEMOGLOBIN A1C: Hgb A1c MFr Bld: 6.3 % (ref 4.6–6.5)

## 2015-01-13 MED ORDER — DULAGLUTIDE 1.5 MG/0.5ML ~~LOC~~ SOAJ: 1.5000 mg | SUBCUTANEOUS | Status: DC

## 2015-01-13 NOTE — Progress Notes (Signed)
Pre visit review using our clinic review tool, if applicable. No additional management support is needed unless otherwise documented below in the visit note. 

## 2015-01-13 NOTE — Progress Notes (Signed)
   Subjective:    Patient ID: Patricia Arroyo, female    DOB: 1965/11/09, 49 y.o.   MRN: [id number]  HPI The patient is a 49 YO female coming in for follow up of her newly diagnosed diabetes. She elected to work on diet and exercise. She has changed from regular sodas to diet since last visit. She has not been exercising since she had a leg surgery. Her weight is up about 2 pounds since last visit. She does understand about the seriousness of this. She was unable to try belviq due to the cost per month.   Review of Systems  Constitutional: Negative for fever, activity change, appetite change, fatigue and unexpected weight change.  HENT: Negative.   Respiratory: Negative for cough, chest tightness, shortness of breath and wheezing.   Cardiovascular: Negative for chest pain, palpitations and leg swelling.  Gastrointestinal: Negative for abdominal pain, diarrhea, constipation and abdominal distention.  Musculoskeletal: Negative.   Neurological: Negative.   Psychiatric/Behavioral: Negative.       Objective:   Physical Exam  Constitutional: She is oriented to person, place, and time. She appears well-developed and well-nourished.  HENT:  Head: Normocephalic and atraumatic.  Eyes: EOM are normal.  Neck: Normal range of motion.  Cardiovascular: Normal rate and regular rhythm.   Pulmonary/Chest: Effort normal and breath sounds normal. No respiratory distress. She has no wheezes. She has no rales.  Abdominal: Soft. Bowel sounds are normal. She exhibits no distension. There is no tenderness.  Musculoskeletal: She exhibits no edema.  Neurological: She is alert and oriented to person, place, and time. Coordination normal.  Skin: Skin is warm and dry.  See foot exam   Filed Vitals:   01/13/15 1028  BP: 136/78  Pulse: 84  Temp: 97.9 F (36.6 C)  TempSrc: Oral  Resp: 12  Height:  (1.575 m)  Weight: 216 lb (97.977 kg)  SpO2: 98%      Assessment & Plan:  Visit time 25 minutes, greater  than 50% of that time was spent in face to face with the patient for counseling and coordination of care: counseling on newly diagnosed diabetes and the disease pathology and consequences to encourage good control.

## 2015-01-13 NOTE — Assessment & Plan Note (Signed)
Previously noted on CT scan from 2012, she has had mild elevations in her liver panel from specialist. She does not know those numbers. Will recheck today and if still elevated will screen for hepatitis.

## 2015-01-13 NOTE — Patient Instructions (Signed)
We have sent in the injection that you take once a week and you can call the office to be taught how to use it when you pick it up. This should also help you to lose weight.

## 2015-01-13 NOTE — Assessment & Plan Note (Signed)
New diagnosis for her. She has not done well with weight loss on her own and would like to start on trulicity. She will bring first to the office for teaching. Checking HgA1c today and foot exam done. Talked to her about the need for yearly eye exam and other aspects of her diabetes. She does not feel the need for nutritional counseling at this time.

## 2015-01-13 NOTE — Assessment & Plan Note (Signed)
Weight up 2 pounds since last visit. Starting trulicity for her diabetes but will likely also help with her weigh management.

## 2015-01-13 NOTE — Assessment & Plan Note (Signed)
BP well controlled on HCTZ and lisinopril. Checking CMP today.

## 2015-01-15 ENCOUNTER — Telehealth: Payer: Self-pay | Admitting: Internal Medicine

## 2015-01-15 NOTE — Telephone Encounter (Signed)
Pt request lat result that was done 01/13/15, mainly looking for the A1C and liver enzyme. Please call her back and its okey to leave detail massage on her vm if she does not answer.

## 2015-01-15 NOTE — Telephone Encounter (Signed)
Left message for patient to call back  

## 2015-01-16 NOTE — Telephone Encounter (Signed)
Pt called back and I informed her of her lab results and also scheduled an appt for her husband.

## 2015-01-30 ENCOUNTER — Telehealth: Payer: Self-pay | Admitting: Internal Medicine

## 2015-02-03 ENCOUNTER — Ambulatory Visit (INDEPENDENT_AMBULATORY_CARE_PROVIDER_SITE_OTHER): Payer: Self-pay

## 2015-02-03 ENCOUNTER — Other Ambulatory Visit: Payer: Self-pay | Admitting: Adult Health

## 2015-02-03 DIAGNOSIS — W19XXXA Unspecified fall, initial encounter: Secondary | ICD-10-CM

## 2015-02-03 DIAGNOSIS — M7989 Other specified soft tissue disorders: Secondary | ICD-10-CM

## 2015-02-03 DIAGNOSIS — M25562 Pain in left knee: Secondary | ICD-10-CM

## 2015-02-06 ENCOUNTER — Encounter: Payer: Self-pay | Admitting: Sports Medicine

## 2015-02-06 ENCOUNTER — Ambulatory Visit (INDEPENDENT_AMBULATORY_CARE_PROVIDER_SITE_OTHER): Payer: Worker's Compensation | Admitting: Sports Medicine

## 2015-02-06 VITALS — BP 133/70 | HR 90 | Wt 214.0 lb

## 2015-02-06 DIAGNOSIS — E669 Obesity, unspecified: Secondary | ICD-10-CM

## 2015-02-06 DIAGNOSIS — M7042 Prepatellar bursitis, left knee: Secondary | ICD-10-CM | POA: Insufficient documentation

## 2015-02-06 DIAGNOSIS — G4762 Sleep related leg cramps: Secondary | ICD-10-CM | POA: Diagnosis not present

## 2015-02-06 MED ORDER — MAGNESIUM OXIDE 400 MG PO TABS: 800.0000 mg | ORAL_TABLET | Freq: Every day | ORAL | Status: DC

## 2015-02-06 MED ORDER — PHENTERMINE HCL 37.5 MG PO TABS: ORAL_TABLET | ORAL | Status: DC

## 2015-02-06 NOTE — Progress Notes (Signed)
   Subjective:    I'm seeing this patient as a consultation for:  Laurance FlattenKaty Bess, NP; Dr. Hillard DankerElizabeth Crawford  CC: Left knee swelling  HPI: A week ago this pleasant 49 year old female had a fall onto her left knee, she had immediate pain, swelling over the anterior knee, she was seen in occupational health and referred to me for further evaluation and definitive treatment. Pain is improving however she continues to have significant swelling on the anterior aspect of her knee. No constitutional symptoms. Symptoms are moderate, improving.  Obesity: Started Trulicity with her PCP, would like to also start phentermine.  Leg cramps: Nocturnal, wonders what can be done.  Past medical history, Surgical history, Family history not pertinant except as noted below, Social history, Allergies, and medications have been entered into the medical record, reviewed, and no changes needed.   Review of Systems: No headache, visual changes, nausea, vomiting, diarrhea, constipation, dizziness, abdominal pain, skin rash, fevers, chills, night sweats, weight loss, swollen lymph nodes, body aches, joint swelling, muscle aches, chest pain, shortness of breath, mood changes, visual or auditory hallucinations.   Objective:   General: Well Developed, well nourished, and in no acute distress.  Neuro/Psych: Alert and oriented x3, extra-ocular muscles intact, able to move all 4 extremities, sensation grossly intact. Skin: Warm and dry, no rashes noted.  Respiratory: Not using accessory muscles, speaking in full sentences, trachea midline.  Cardiovascular: Pulses palpable, no extremity edema. Abdomen: Does not appear distended. Left Knee: Visible prepatellar bursitis, no erythema, only minimal tenderness, no induration. No pain at the joint lines and no evidence of joint effusion. ROM normal in flexion and extension and lower leg rotation. Ligaments with solid consistent endpoints including ACL, PCL, LCL, MCL. Negative  Mcmurray's and provocative meniscal tests. Non painful patellar compression. Patellar and quadriceps tendons unremarkable. Hamstring and quadriceps strength is normal.  Procedure: Real-time Ultrasound Guided aspiration/Injection of left prepatellar bursa Device: GE Logiq E  Verbal informed consent obtained.  Time-out conducted.  Noted no overlying erythema, induration, or other signs of local infection.  Skin prepped in a sterile fashion.  Local anesthesia: Topical Ethyl chloride.  With sterile technique and under real time ultrasound guidance:  Using a 22-gauge needle aspirated 12 mL of relatively clear serosanguineous fluid, syringe switched and 1 mL kenalog 40 injected easily. Completed without difficulty  Pain immediately resolved suggesting accurate placement of the medication.  Advised to call if fevers/chills, erythema, induration, drainage, or persistent bleeding.  Images permanently stored and available for review in the ultrasound unit.  Impression: Technically successful ultrasound guided injection.  The knee was then strapped with compressive dressing.  Impression and Recommendations:   This case required medical decision making of moderate complexity.

## 2015-02-06 NOTE — Assessment & Plan Note (Signed)
Adding phentermine. Return to PCP for weight checks.

## 2015-02-06 NOTE — Assessment & Plan Note (Signed)
Magnesium oxide 800 mg daily at bedtime

## 2015-02-06 NOTE — Assessment & Plan Note (Signed)
Aspiration and injection as above, sent off for cell count, crystal analysis, cultures, strapped with compressive dressing, return to see me in one month.

## 2015-02-07 LAB — SYNOVIAL CELL COUNT + DIFF, W/ CRYSTALS
Crystals, Fluid: NONE SEEN
Eosinophils-Synovial: 1 % (ref 0–1)
Lymphocytes-Synovial Fld: 93 % — ABNORMAL HIGH (ref 0–20)
Monocyte/Macrophage: 1 % — ABNORMAL LOW (ref 50–90)
Neutrophil, Synovial: 5 % (ref 0–25)
WBC, Synovial: 1505 cu mm — ABNORMAL HIGH (ref 0–200)

## 2015-02-10 ENCOUNTER — Encounter: Payer: Self-pay | Admitting: Sports Medicine

## 2015-02-10 LAB — BODY FLUID CULTURE
Gram Stain: NONE SEEN
Gram Stain: NONE SEEN
Organism ID, Bacteria: NO GROWTH

## 2015-03-06 ENCOUNTER — Encounter: Payer: Self-pay | Admitting: Sports Medicine

## 2015-03-06 ENCOUNTER — Ambulatory Visit (INDEPENDENT_AMBULATORY_CARE_PROVIDER_SITE_OTHER): Payer: Worker's Compensation | Admitting: Sports Medicine

## 2015-03-06 VITALS — BP 115/71 | HR 83 | Wt 205.0 lb

## 2015-03-06 DIAGNOSIS — M7042 Prepatellar bursitis, left knee: Secondary | ICD-10-CM | POA: Diagnosis not present

## 2015-03-06 DIAGNOSIS — G4762 Sleep related leg cramps: Secondary | ICD-10-CM | POA: Diagnosis not present

## 2015-03-06 DIAGNOSIS — E669 Obesity, unspecified: Secondary | ICD-10-CM | POA: Diagnosis not present

## 2015-03-06 NOTE — Assessment & Plan Note (Signed)
Resolved with magnesium oxide 800 mg daily at bedtime

## 2015-03-06 NOTE — Assessment & Plan Note (Signed)
Good weight loss on Trulicity, has not even started phentermine and she is 10 pounds down. She will continue further follow-up of this with her PCP.

## 2015-03-06 NOTE — Assessment & Plan Note (Addendum)
Resolved with aspiration and injection. Crystal analysis and cultures were negative.

## 2015-03-06 NOTE — Progress Notes (Signed)
  Subjective:    CC: Follow-up  HPI: Prepatellar bursitis: Resolved with aspiration and injection.  Nocturnal leg cramp: Resolved with magnesium oxide at bedtime  Obesity: 10 pound weight loss on Trulicity, has not yet even starting phentermine. Agrees to follow this up with her PCP.  Past medical history, Surgical history, Family history not pertinant except as noted below, Social history, Allergies, and medications have been entered into the medical record, reviewed, and no changes needed.   Review of Systems: No fevers, chills, night sweats, weight loss, chest pain, or shortness of breath.   Objective:    General: Well Developed, well nourished, and in no acute distress.  Neuro: Alert and oriented x3, extra-ocular muscles intact, sensation grossly intact.  HEENT: Normocephalic, atraumatic, pupils equal round reactive to light, neck supple, no masses, no lymphadenopathy, thyroid nonpalpable.  Skin: Warm and dry, no rashes. Cardiac: Regular rate and rhythm, no murmurs rubs or gallops, no lower extremity edema.  Respiratory: Clear to auscultation bilaterally. Not using accessory muscles, speaking in full sentences. Left Knee: Normal to inspection with no erythema or effusion or obvious bony abnormalities. Palpation normal with no warmth or joint line tenderness or patellar tenderness or condyle tenderness. ROM normal in flexion and extension and lower leg rotation. Ligaments with solid consistent endpoints including ACL, PCL, LCL, MCL. Negative Mcmurray's and provocative meniscal tests. Non painful patellar compression. Patellar and quadriceps tendons unremarkable. Hamstring and quadriceps strength is normal.  Impression and Recommendations:

## 2015-03-15 ENCOUNTER — Other Ambulatory Visit: Payer: Self-pay | Admitting: Internal Medicine

## 2015-03-28 ENCOUNTER — Emergency Department
Admission: EM | Admit: 2015-03-28 | Discharge: 2015-03-28 | Disposition: A | Discharge status: Home / Self Care | Payer: 59 | Source: Home / Self Care

## 2015-03-28 HISTORY — DX: Type 2 diabetes mellitus without complications: E11.9

## 2015-03-28 LAB — HIV ANTIBODY (ROUTINE TESTING W REFLEX): HIV 1&2 Ab, 4th Generation: NONREACTIVE

## 2015-03-28 LAB — HEPATITIS C ANTIBODY: HCV Ab: NEGATIVE

## 2015-03-28 NOTE — ED Notes (Signed)
Pt doing CPR on person at work yesterday, and exposed to bodily fluids.

## 2015-04-02 ENCOUNTER — Telehealth: Payer: Self-pay | Admitting: Emergency Medicine

## 2015-04-03 ENCOUNTER — Other Ambulatory Visit: Payer: Self-pay | Admitting: Internal Medicine

## 2015-04-16 ENCOUNTER — Other Ambulatory Visit: Payer: Self-pay | Admitting: Internal Medicine

## 2015-05-25 ENCOUNTER — Encounter: Payer: Self-pay | Admitting: Family

## 2015-05-25 ENCOUNTER — Ambulatory Visit (INDEPENDENT_AMBULATORY_CARE_PROVIDER_SITE_OTHER): Payer: 59 | Admitting: Family

## 2015-05-25 ENCOUNTER — Other Ambulatory Visit (INDEPENDENT_AMBULATORY_CARE_PROVIDER_SITE_OTHER): Payer: 59

## 2015-05-25 VITALS — BP 110/78 | HR 74 | Temp 97.6°F | Resp 16 | Ht 62.0 in | Wt 208.0 lb

## 2015-05-25 DIAGNOSIS — R499 Unspecified voice and resonance disorder: Secondary | ICD-10-CM | POA: Insufficient documentation

## 2015-05-25 DIAGNOSIS — E119 Type 2 diabetes mellitus without complications: Secondary | ICD-10-CM

## 2015-05-25 DIAGNOSIS — L608 Other nail disorders: Secondary | ICD-10-CM

## 2015-05-25 DIAGNOSIS — Z23 Encounter for immunization: Secondary | ICD-10-CM

## 2015-05-25 DIAGNOSIS — R29898 Other symptoms and signs involving the musculoskeletal system: Secondary | ICD-10-CM | POA: Diagnosis not present

## 2015-05-25 LAB — HEMOGLOBIN A1C: Hgb A1c MFr Bld: 6 % (ref 4.6–6.5)

## 2015-05-25 MED ORDER — ONETOUCH ULTRA SYSTEM W/DEVICE KIT: PACK | Status: DC

## 2015-05-25 MED ORDER — GLUCOSE BLOOD VI STRP: ORAL_STRIP | Status: DC

## 2015-05-25 MED ORDER — ONETOUCH ULTRASOFT LANCETS MISC: Status: DC

## 2015-05-25 MED ORDER — CLOTRIMAZOLE-BETAMETHASONE 1-0.05 % EX CREA: 1.0000 "application " | TOPICAL_CREAM | Freq: Two times a day (BID) | CUTANEOUS | Status: DC

## 2015-05-25 MED ORDER — TRIAMTERENE-HCTZ 37.5-25 MG PO TABS: 1.0000 | ORAL_TABLET | Freq: Every day | ORAL | Status: DC

## 2015-05-25 NOTE — Assessment & Plan Note (Signed)
Change of voice of undetermined origin with differentials include GERD, viral syndrome or post-nasal drip. Continue conservative treatment with over the counter medication management including Zantac or Pepcid as needed and follow up if symptoms worsen or do not improve.

## 2015-05-25 NOTE — Assessment & Plan Note (Signed)
Obtain A1c. Continue current dosage of Trulicity. Pneumovax updated today. Diabetic eye exam is complete per patient. Continue to monitor blood sugars at home. Maintained on lisinopril for CAD risk reduction. Follow up with PCP in 3 weeks as scheduled.

## 2015-05-25 NOTE — Patient Instructions (Addendum)
Thank you for choosing [facility].  Summary/Instructions:  Your prescription(s) have been submitted to your pharmacy or been printed and provided for you. Please take as directed and contact our office if you believe you are having problem(s) with the medication(s) or have any questions.  If your symptoms worsen or fail to improve, please contact our office for further instruction, or in case of emergency go directly to the emergency room at the closest medical facility.    Please continue to monitor your toe nail. If it worsens follow up with Dr. Okey Dupre.   Change in voice may still be related to a viral infection or possible GERD.   Please schedule a time for your diabetic eye exam with your ophthalmologist/optimitrist.   Continue to take your medication as prescribed.

## 2015-05-25 NOTE — Assessment & Plan Note (Signed)
Nonspecific nail change that does not appear to be fungal upon assessment. Recommend continue to monitor at this time and follow up if nail continues to change or does not improve.

## 2015-05-25 NOTE — Progress Notes (Signed)
Pre visit review using our clinic review tool, if applicable. No additional management support is needed unless otherwise documented below in the visit note. 

## 2015-05-25 NOTE — Progress Notes (Signed)
Subjective:    Patient ID: Patricia Arroyo, female    DOB: [birth year], 50 y.o.   MRN: [id number]  Chief Complaint  Patient presents with  . Cough    states that [facility], still has some froggyness in her voice, A1c check, toe fungus, rx for accu chek meter    HPI:  Patricia Arroyo is a 50 y.o. female who  has a past medical history of Hypertension; GERD (gastroesophageal reflux disease); Depression; ADHD (attention deficit hyperactivity disorder); IBS (irritable bowel syndrome); Anxiety; Carpal tunnel syndrome; Varicose veins; and Diabetes mellitus without complication ([place]). and presents today for an acute office visit.   1.) Cough and congestion - [facility] seen [facility] and cough. She was diagnosed with sinusitis and prescribed doxycycline which she completed with any difficulty. Continues to expeirence some congestion and changes to her voice that she describes as frogginess. Modifying factors include Flonase and the antihistimine have not made much progress with these symptoms going on for since early January. Denies fevers. Course of the symptoms waxes and wanes with some improvement and some worsening.   2.) Diabetes - currently maintained on Trulicity. Reports taking the medication as prescribed with no adverse side effects or hypoglycemic readings. Due for eye exam. Has not had the pneumonia.   Lab Results  Component Value Date   HGBA1C 6.0 05/25/2015    3.) Toe fungus - This is a new problem. Associated symptom of discoloration located on her left great toe has been going on for about 2-3 months since going to a nail salon. Denies any worsening since initially noting it.   Allergies  Allergen Reactions  . Adhesive [Tape] Hives and Other (See Comments)    Pt states that she is allergic to adhesive on Band-Aids.  . Banana     Burning sensation on tongue  . Diflucan [Fluconazole] Hives  . Penicillins Rash  . Sulfa  Antibiotics Rash     Current Outpatient Prescriptions on File Prior to Visit  Medication Sig Dispense Refill  . diphenoxylate-atropine (LOMOTIL) 2.5-0.025 MG per tablet Take 1 tablet by mouth as needed for diarrhea or loose stools.    . Dulaglutide (TRULICITY) 1.5 TI/4.5YK SOPN Inject 1.5 mg into the skin once a week. 4 pen 6  . lisinopril (PRINIVIL,ZESTRIL) 10 MG tablet TAKE 1 TABLET (10 MG TOTAL) BY MOUTH DAILY. 90 tablet 3  . magnesium oxide (MAG-OX) 400 MG tablet Take 2 tablets (800 mg total) by mouth at bedtime. 90 tablet 3  . omeprazole (PRILOSEC) 40 MG capsule TAKE 1 CAPSULE (40 MG TOTAL) BY MOUTH DAILY. 90 capsule 1  . zolpidem (AMBIEN) 10 MG tablet Take 10 mg by mouth at bedtime as needed. sleep     . estradiol (ESTRACE) 2 MG tablet Take 1 tablet (2 mg total) by mouth daily. 30 tablet 12   No current facility-administered medications on file prior to visit.    Past Medical History  Diagnosis Date  . Hypertension   . GERD (gastroesophageal reflux disease)   . Depression     h/o---no meds now  . ADHD (attention deficit hyperactivity disorder)   . IBS (irritable bowel syndrome)   . Anxiety   . Carpal tunnel syndrome   . Varicose veins   . Diabetes mellitus without complication [facility])     Past Surgical History  Procedure Laterality Date  . Foot fasciotomy  2005    both feet  .  Dilation and curettage of uterus    . Novasure ablation    . Wisdom tooth extraction    . Colonoscopy    . Laparoscopic assisted vaginal hysterectomy  03/10/2011    Procedure: LAPAROSCOPIC ASSISTED VAGINAL HYSTERECTOMY;  Surgeon: Luz Lex, MD;  Location: [facility];  Service: Gynecology;  Laterality: N/A;  . Salpingoophorectomy  03/10/2011    Procedure: SALPINGO OOPHERECTOMY;  Surgeon: Luz Lex, MD;  Location: [facility];  Service: Gynecology;  Laterality: Bilateral;  . Anterior and posterior repair  03/10/2011    Procedure: ANTERIOR (CYSTOCELE) AND POSTERIOR REPAIR (RECTOCELE);  Surgeon: Luz Lex, MD;  Location: [facility];  Service: Gynecology;  Laterality: N/A;  with Sacrospinous Ligament Suspension  . Carpal tunnel release Bilateral 04/01/2013    Procedure: BILATERAL CARPAL TUNNEL RELEASE;  Surgeon: Tennis Must, MD;  Location: [facility];  Service: Orthopedics;  Laterality: Bilateral;    Review of Systems  Constitutional: Negative for fever and chills.  HENT: Positive for congestion and voice change.   Eyes:       Denies changes in vision  Respiratory: Negative for cough, chest tightness and wheezing.   Cardiovascular: Negative for chest pain, palpitations and leg swelling.  Endocrine: Negative for polydipsia, polyphagia and polyuria.  Neurological: Negative for numbness and headaches.      Objective:    BP 110/78 mmHg  Pulse 74  Temp(Src) 97.6 F (36.4 C) (Oral)  Resp 16  Ht '5\' 2"'  (1.575 m)  Wt 208 lb (94.348 kg)  BMI 38.03 kg/m2  SpO2 97% Nursing note and vital signs reviewed.  Physical Exam  Constitutional: She is oriented to person, place, and time. She appears well-developed and well-nourished. No distress.  Cardiovascular: Normal rate, regular rhythm, normal heart sounds and intact distal pulses.   Pulmonary/Chest: Effort normal and breath sounds normal.  Neurological: She is alert and oriented to person, place, and time.  Skin: Skin is warm and dry.  Left great toenail with white annular shape towards the end of the nail with mild thickening. No other discoloration noted.   Psychiatric: She has a normal mood and affect. Her behavior is normal. Judgment and thought content normal.       Assessment & Plan:   Problem List Items Addressed This Visit      Endocrine   Diabetes type 2, controlled ([place])    Obtain A1c. Continue current dosage of Trulicity. Pneumovax updated today. Diabetic eye exam is complete per patient. Continue to monitor blood sugars at home. Maintained on lisinopril for CAD risk reduction. Follow up with PCP in 3 weeks as  scheduled.       Relevant Medications   Blood Glucose Monitoring Suppl (ONE TOUCH ULTRA SYSTEM KIT) w/Device KIT   Lancets (ONETOUCH ULTRASOFT) lancets   glucose blood (ONE TOUCH ULTRA TEST) test strip   Other Relevant Orders   Hemoglobin A1c (Completed)     Other   Change in nail appearance - Primary    Nonspecific nail change that does not appear to be fungal upon assessment. Recommend continue to monitor at this time and follow up if nail continues to change or does not improve.       Change in voice    Change of voice of undetermined origin with differentials include GERD, viral syndrome or post-nasal drip. Continue conservative treatment with over the counter medication management including Zantac or Pepcid as needed and follow up if symptoms worsen or do not improve.  Other Visit Diagnoses    Need for 23-polyvalent pneumococcal polysaccharide vaccine        Relevant Orders    Pneumococcal polysaccharide vaccine 23-valent greater than or equal to 2yo subcutaneous/IM (Completed)

## 2015-05-26 ENCOUNTER — Telehealth: Payer: Self-pay | Admitting: Family

## 2015-05-26 NOTE — Telephone Encounter (Signed)
Please inform patient that her A1c is 6.0 indicating that her diabetes is exceptionally well controlled. We can consider a trial without the Trulicity if she is interested.

## 2015-05-27 ENCOUNTER — Ambulatory Visit (INDEPENDENT_AMBULATORY_CARE_PROVIDER_SITE_OTHER): Payer: Worker's Compensation | Admitting: Sports Medicine

## 2015-05-27 ENCOUNTER — Encounter: Payer: Self-pay | Admitting: Sports Medicine

## 2015-05-27 VITALS — BP 114/67 | HR 83 | Resp 16 | Wt 209.5 lb

## 2015-05-27 DIAGNOSIS — M1712 Unilateral primary osteoarthritis, left knee: Secondary | ICD-10-CM | POA: Insufficient documentation

## 2015-05-27 DIAGNOSIS — M76899 Other specified enthesopathies of unspecified lower limb, excluding foot: Secondary | ICD-10-CM

## 2015-05-27 DIAGNOSIS — M769 Unspecified enthesopathy, lower limb, excluding foot: Secondary | ICD-10-CM | POA: Diagnosis not present

## 2015-05-27 MED ORDER — NITROGLYCERIN 0.2 MG/HR TD PT24: MEDICATED_PATCH | TRANSDERMAL | Status: DC

## 2015-05-27 NOTE — Assessment & Plan Note (Signed)
There are some patellofemoral pain symptoms however concordant pain is noted at the proximal patella at the quadriceps insertion. Topical nitroglycerin, physical therapy, return to see me in one month.

## 2015-05-27 NOTE — Progress Notes (Signed)
  Subjective:    CC: Recurrent left knee pain  HPI: This is a pleasant 50 year old female, we did a prepatellar bursa aspiration and injection about 3-4 months ago, this continues to be resolved over she is complaining of some pain that she localizes at the proximal patella near the distal quadriceps insertion, moderate, persistent, worse with squatting, going up and down steps. No mechanical symptoms, no swelling.  Past medical history, Surgical history, Family history not pertinant except as noted below, Social history, Allergies, and medications have been entered into the medical record, reviewed, and no changes needed.   Review of Systems: No fevers, chills, night sweats, weight loss, chest pain, or shortness of breath.   Objective:    General: Well Developed, well nourished, and in no acute distress.  Neuro: Alert and oriented x3, extra-ocular muscles intact, sensation grossly intact.  HEENT: Normocephalic, atraumatic, pupils equal round reactive to light, neck supple, no masses, no lymphadenopathy, thyroid nonpalpable.  Skin: Warm and dry, no rashes. Cardiac: Regular rate and rhythm, no murmurs rubs or gallops, no lower extremity edema.  Respiratory: Clear to auscultation bilaterally. Not using accessory muscles, speaking in full sentences. Left Knee: Normal to inspection with no erythema or effusion or obvious bony abnormalities. Palpation normal with no warmth or joint line tenderness or patellar tenderness or condyle tenderness. ROM normal in flexion and extension and lower leg rotation. Ligaments with solid consistent endpoints including ACL, PCL, LCL, MCL. Negative Mcmurray's and provocative meniscal tests. Non painful patellar compression. No tenderness over the patellar tendon, there is severe tenderness at the distal quadriceps at the insertion Hamstring and quadriceps strength is normal.  Impression and Recommendations:    I spent 40 minutes with this patient, greater  than 50% was face-to-face time counseling regarding the above diagnoses

## 2015-05-28 NOTE — Telephone Encounter (Signed)
Left detailed message letting pt know the results. Asked her to call back with any questions or concerns.

## 2015-06-04 ENCOUNTER — Ambulatory Visit: Payer: Self-pay | Admitting: Rehabilitative and Restorative Service Providers"

## 2015-06-08 ENCOUNTER — Ambulatory Visit (INDEPENDENT_AMBULATORY_CARE_PROVIDER_SITE_OTHER): Payer: Worker's Compensation | Admitting: Rehabilitative and Restorative Service Providers"

## 2015-06-08 ENCOUNTER — Encounter: Payer: Self-pay | Admitting: Rehabilitative and Restorative Service Providers"

## 2015-06-08 DIAGNOSIS — R531 Weakness: Secondary | ICD-10-CM

## 2015-06-08 DIAGNOSIS — M25662 Stiffness of left knee, not elsewhere classified: Secondary | ICD-10-CM | POA: Diagnosis not present

## 2015-06-08 DIAGNOSIS — R29898 Other symptoms and signs involving the musculoskeletal system: Secondary | ICD-10-CM

## 2015-06-08 DIAGNOSIS — Z7409 Other reduced mobility: Secondary | ICD-10-CM

## 2015-06-08 DIAGNOSIS — M25562 Pain in left knee: Secondary | ICD-10-CM

## 2015-06-08 NOTE — Patient Instructions (Signed)
Hip Flexor, Quadricep Stretch: Belly Down (Strap)    Engage stable pelvic tilt. Hold top of foot with hand or strap. Hold for __30 seconds. Repeat __3__ times each leg.2-3 times/day   HIP: Hamstrings - Supine    Place strap around foot. Raise leg up, keep knee straight. Hold _30__ seconds. __3_ reps per set, _2-3__ sets per day   Outer Hip Stretch: Reclined IT Band Stretch (Strap)    Strap around opposite foot, pull across only as far as possible with shoulders on mat. Hold for _30 sec  Repeat __3__ times each leg. 2-3 times/day    Piriformis Stretch    Lying on back, pull right knee toward opposite shoulder. Hold _30___ seconds. Repeat __3__ times. Do _2-3___ sessions per day.   Massage to thigh using "the stick" and balls of various sizes   TENS UNIT: This is helpful for muscle pain and spasm.   Search and Purchase a TENS 7000 2nd edition at www.tenspros.com. It should be less than $30.     TENS unit instructions: Do not shower or bathe with the unit on Turn the unit off before removing electrodes or batteries If the electrodes lose stickiness add a drop of water to the electrodes after they are disconnected from the unit and place on plastic sheet. If you continued to have difficulty, call the TENS unit company to purchase more electrodes. Do not apply lotion on the skin area prior to use. Make sure the skin is clean and dry as this will help prolong the life of the electrodes. After use, always check skin for unusual red areas, rash or other skin difficulties. If there are any skin problems, does not apply electrodes to the same area. Never remove the electrodes from the unit by pulling the wires. Do not use the TENS unit or electrodes other than as directed. Do not change electrode placement without consultating your therapist or physician. Keep 2 fingers with between each electrode.

## 2015-06-08 NOTE — Therapy (Addendum)
[facility] [place] [address] [place], [place], [address] Phone: [phone]   Fax:  [phone]  Physical Therapy Evaluation  Patient Details  Name: Patricia Arroyo MRN: [id number] Date of Birth: Oct 14, 1965 Referring Provider: Dr. Benjamin Stain   Encounter Date: 06/08/2015      PT End of Session - 06/08/15 1408    Visit Number 1   Number of Visits 12   Date for PT Re-Evaluation 07/20/15   PT Start Time 1411   PT Stop Time 1511   PT Time Calculation (min) 60 min   Activity Tolerance Patient tolerated treatment well;Patient limited by pain      Past Medical History  Diagnosis Date  . Hypertension   . GERD (gastroesophageal reflux disease)   . Depression     h/o---no meds now  . ADHD (attention deficit hyperactivity disorder)   . IBS (irritable bowel syndrome)   . Anxiety   . Carpal tunnel syndrome   . Varicose veins   . Diabetes mellitus without complication [facility])     Past Surgical History  Procedure Laterality Date  . Foot fasciotomy  2005    both feet  . Dilation and curettage of uterus    . Novasure ablation    . Wisdom tooth extraction    . Colonoscopy    . Laparoscopic assisted vaginal hysterectomy  03/10/2011    Procedure: LAPAROSCOPIC ASSISTED VAGINAL HYSTERECTOMY;  Surgeon: Turner Daniels, MD;  Location: [facility];  Service: Gynecology;  Laterality: N/A;  . Salpingoophorectomy  03/10/2011    Procedure: SALPINGO OOPHERECTOMY;  Surgeon: Turner Daniels, MD;  Location: [facility];  Service: Gynecology;  Laterality: Bilateral;  . Anterior and posterior repair  03/10/2011    Procedure: ANTERIOR (CYSTOCELE) AND POSTERIOR REPAIR (RECTOCELE);  Surgeon: Turner Daniels, MD;  Location: [facility];  Service: Gynecology;  Laterality: N/A;  with Sacrospinous Ligament Suspension  . Carpal tunnel release Bilateral 04/01/2013    Procedure: BILATERAL CARPAL TUNNEL RELEASE;  Surgeon: Tami Ribas, MD;  Location: [facility];   Service: Orthopedics;  Laterality: Bilateral;    There were no vitals filed for this visit.  Visit Diagnosis:  Pain in left knee - Plan: PT plan of care cert/re-cert  Stiffness of knee joint, left - Plan: PT plan of care cert/re-cert  Weakness of both lower limbs - Plan: PT plan of care cert/re-cert  Decreased strength, endurance, and mobility - Plan: PT plan of care cert/re-cert      Subjective Assessment - 06/08/15 1413    Subjective Patient reports that she was walking into work 10/16 when she fell onto the Lt knee. Knee improved with ice; OTC meds; rest; etc. She had fluid drained and received an injection 12/16 with some improvement. She has had continued problems including pain and difficulty with bending; kneeling; reaching. She was seen by Dr. Janie Morning  05/27/15.    Pertinent History AODM; HTN; chronic LBP on an intermittent basis; plantar fascitiis bilat; surgery bilat feet ~8 years and ~9 years ago - for plantar fascitiis    How long can you sit comfortably? no limit   How long can you stand comfortably? 30-60 min min    How long can you walk comfortably? no limit   Diagnostic tests xrays   Patient Stated Goals to be able to kneel without pain without shifting over to the Rt leg   Currently in Pain? Yes   Pain Score 2    Pain Location Knee  Pain Orientation Left   Pain Descriptors / Indicators Sore   Pain Type Acute pain   Pain Onset More than a month ago   Pain Frequency Intermittent   Aggravating Factors  kneeling; leaning forward to reach   Pain Relieving Factors nothing that she knows of             [facility] PT Assessment - 06/08/15 0001    Assessment   Medical Diagnosis Lt distal quadriceps tendinopathy    Referring Provider Dr. Benjamin Stainhekkekandam    Onset Date/Surgical Date 01/17/15   Hand Dominance Right   Next MD Visit 03/17   Prior Therapy none    Precautions   Precautions None   Restrictions   Weight Bearing Restrictions No   Balance Screen   Has the  patient fallen in the past 6 months Yes   How many times? 1   Has the patient had a decrease in activity level because of a fear of falling?  No   Is the patient reluctant to leave their home because of a fear of falling?  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    Leisure houshold chores otherwise sedentary    Observation/Other Assessments   Focus on Therapeutic Outcomes (FOTO)  49% limitation    Observation/Other Assessments-Edema    Edema --  pocket of edema at Lt patella    Sensation   Additional Comments WFL's per pt report    Posture/Postural Control   Posture Comments head forward; shoulders rounded; knees hyperextended; hips in ER Lt > Rt; feet pronated   AROM   Right/Left Hip --  tight end ranges flexion and rotations   Right Knee Extension 0   Right Knee Flexion 132   Left Knee Extension 0   Left Knee Flexion 122   Right/Left Ankle --  tight into DF bilat ~ neutral    Strength   Overall Strength Comments 5/5 bilat LE's except Lt knee flex/ext 5-/5; hip ext Lt 4/5; Rt hip ext 4+/5    Flexibility   Hamstrings Rt 78 deg; Lt 72 deg    Quadriceps Rt heel to buttock ~5 inches; Lt 8 inches from buttock   ITB tight Lt   Piriformis tight Lt    Palpation   Patella mobility WFL's    Palpation comment tight distal quad medially > laterally    Functional Gait  Assessment   Gait assessed  --  Lt toe externally rotated no visable limp                    [facility] Adult PT Treatment/Exercise - 06/08/15 0001    Therapeutic Activites    Therapeutic Activities --  myofacial ball release work quads/hams   Knee/Hip Exercises: Community education officertretches   Passive Hamstring Stretch 3 reps;30 seconds   Quad Stretch 3 reps;30  seconds   ITB Stretch 3 reps;60 seconds   Piriformis Stretch 3 reps;30 seconds   Programme researcher, broadcasting/film/videolectrical Stimulation   Electrical Stimulation Location Lt knee    Statisticianlectrical Stimulation Action IFC   Electrical Stimulation Parameters to tolerance   Electrical Stimulation Goals Pain;Edema   Vasopneumatic   Number Minutes Vasopneumatic  15 minutes   Vasopnuematic Location  Knee  Vasopneumatic Pressure Medium   Vasopneumatic Temperature  3*                PT Education - 06/08/15 1458    Education provided Yes   Education Details HEP; myofacial ball release work; Set designer) Educated Patient   Methods Explanation;Demonstration;Tactile cues;Verbal cues;Handout   Comprehension Verbalized understanding;Returned demonstration;Verbal cues required;Tactile cues required             PT Long Term Goals - 06/08/15 1506    PT LONG TERM GOAL #1   Title Increase Lt knee ROM to 128 to 130 deg flexion to allow pt to perform normal functional activities 07/20/15   Time 6   Period Weeks   Status New   PT LONG TERM GOAL #2   Title Decrease pain allowing patient to kneel on padded surface with pain 0/10 to 2/10 at most 07/20/15   Time 6   Period Weeks   Status New   PT LONG TERM GOAL #3   Title Patient to ascend and descend steps without difficulty 07/20/15   Time 6   Period Weeks   Status New   PT LONG TERM GOAL #4   Title I in HEP 07/20/15   Time 6   Period Weeks   Status New   PT LONG TERM GOAL #5   Title Improve FOTO to </= 39% limitation 07/20/15   Time 6   Period Weeks   Status New               Plan - 06/08/15 1502    Clinical Impression Statement Patient presents with unresolved Lt knee pain follwoing fall 10/16 in which she struck her Lt patella. She has continued pain with functional activities including kneeling; ascending or descending steps; pressure to knee. She has limited mbility and decresaed strength Lt LE. Pt will benefit form PT to address  deficits identified.    Pt will benefit from skilled therapeutic intervention in order to improve on the following deficits Postural dysfunction;Improper body mechanics;Pain;Decreased range of motion;Decreased mobility;Decreased strength;Decreased endurance;Decreased activity tolerance   Rehab Potential Good   PT Frequency 2x / week   PT Duration 6 weeks   PT Treatment/Interventions Patient/family education;ADLs/Self Care Home Management;Therapeutic exercise;Therapeutic activities;Balance training;Neuromuscular re-education;Manual techniques;Dry needling;Cryotherapy;Electrical Stimulation;Iontophoresis /ml Dexamethasone;Moist Heat;Ultrasound   PT Next Visit Plan Stretch for gastroc and soleus; deep tissue release work through [employer]    PT Home Exercise Plan HEP; myofacial ball release work   Financial planner with Plan of Care Patient         Problem List Patient Active Problem List   Diagnosis Date Noted  . Left distal quadriceps tendinopathy, exertional 05/27/2015  . Change in nail appearance 05/25/2015  . Change in voice 05/25/2015  . Prepatellar bursitis of left knee 02/06/2015  . Nocturnal leg cramps 02/06/2015  . Diabetes type 2, controlled ([facility]) 01/13/2015  . Fatty liver disease, nonalcoholic 01/13/2015  . Varicose veins of leg with complications 05/12/2014  . Essential hypertension 04/20/2014  . IBS (irritable bowel syndrome) 04/20/2014  . GERD (gastroesophageal reflux disease) 04/20/2014  . Obesity 04/20/2014  . Varicose veins without complication 02/03/2014    Celyn Rober Minion PT, MPH  06/08/2015, 3:14 PM  [facility] [address] [place], [place], [address] Phone: [phone]   Fax:  [phone]  Name: Patricia Arroyo MRN: [id number] Date of Birth: 10-Sep-1965

## 2015-06-09 ENCOUNTER — Encounter: Payer: Self-pay | Admitting: Sports Medicine

## 2015-06-09 ENCOUNTER — Ambulatory Visit (INDEPENDENT_AMBULATORY_CARE_PROVIDER_SITE_OTHER): Payer: Worker's Compensation | Admitting: Sports Medicine

## 2015-06-09 VITALS — BP 130/84 | HR 80 | Resp 18 | Wt 207.9 lb

## 2015-06-09 DIAGNOSIS — M7042 Prepatellar bursitis, left knee: Secondary | ICD-10-CM

## 2015-06-09 DIAGNOSIS — M76899 Other specified enthesopathies of unspecified lower limb, excluding foot: Secondary | ICD-10-CM

## 2015-06-09 DIAGNOSIS — M769 Unspecified enthesopathy, lower limb, excluding foot: Secondary | ICD-10-CM | POA: Diagnosis not present

## 2015-06-09 MED ORDER — AMBULATORY NON FORMULARY MEDICATION: Status: DC

## 2015-06-09 NOTE — Assessment & Plan Note (Signed)
4 month response, to prior bursal aspiration. Repeat bursal aspiration as above.

## 2015-06-09 NOTE — Progress Notes (Signed)
  Subjective:    CC: follow-up  HPI: Left prepatellar bursitis: Previous aspiration was 4 months ago, now having a reaccumulation of fluid.  It is painful, pain is moderate, persistent without radiation, no fevers, chills, or other constitutional symptoms.  Insertional quadriceps tendinopathy: Using topical nitroglycerin, getting a mild headache, has only had a single session of physical therapy.  Past medical history, Surgical history, Family history not pertinant except as noted below, Social history, Allergies, and medications have been entered into the medical record, reviewed, and no changes needed.   Review of Systems: No fevers, chills, night sweats, weight loss, chest pain, or shortness of breath.   Objective:    General: Well Developed, well nourished, and in no acute distress.  Neuro: Alert and oriented x3, extra-ocular muscles intact, sensation grossly intact.  HEENT: Normocephalic, atraumatic, pupils equal round reactive to light, neck supple, no masses, no lymphadenopathy, thyroid nonpalpable.  Skin: Warm and dry, no rashes. Cardiac: Regular rate and rhythm, no murmurs rubs or gallops, no lower extremity edema.  Respiratory: Clear to auscultation bilaterally. Not using accessory muscles, speaking in full sentences. Left Knee: Visibly distended and tender prepatellar bursa Palpation normal with no warmth or joint line tenderness or patellar tenderness or condyle tenderness. ROM normal in flexion and extension and lower leg rotation. Ligaments with solid consistent endpoints including ACL, PCL, LCL, MCL. Negative Mcmurray's and provocative meniscal tests. Non painful patellar compression. Patellar and quadriceps tendons unremarkable. Hamstring and quadriceps strength is normal.  Procedure: Real-time Ultrasound Guided Aspiration/Injection of left prepatellar bursa Device: GE Logiq E  Verbal informed consent obtained.  Time-out conducted.  Noted no overlying erythema,  induration, or other signs of local infection.  Skin prepped in a sterile fashion.  Local anesthesia: Topical Ethyl chloride.  With sterile technique and under real time ultrasound guidance:  Using an 18-gauge needle aspirated 19 mL of slightly cloudy serosanguineous fluid, syringe switched and 0.5 mL kenalog 40, 1 mL lidocaine injected easily Completed without difficulty  Pain immediately resolved suggesting accurate placement of the medication.  Advised to call if fevers/chills, erythema, induration, drainage, or persistent bleeding.  Images permanently stored and available for review in the ultrasound unit.  Impression: Technically successful ultrasound guided injection.  Impression and Recommendations:

## 2015-06-09 NOTE — Addendum Note (Signed)
Addended by: Baird KayUGLAS, Dekendrick Uzelac M on: 06/09/2015 02:35 PM   Modules accepted: Medications

## 2015-06-09 NOTE — Assessment & Plan Note (Signed)
Continue with physical therapy as well as topical nitroglycerin patches.

## 2015-06-10 ENCOUNTER — Ambulatory Visit: Payer: Self-pay | Admitting: Physical Therapy

## 2015-06-15 ENCOUNTER — Ambulatory Visit (INDEPENDENT_AMBULATORY_CARE_PROVIDER_SITE_OTHER): Payer: Worker's Compensation | Admitting: Physical Therapy

## 2015-06-15 DIAGNOSIS — Z7409 Other reduced mobility: Secondary | ICD-10-CM

## 2015-06-15 DIAGNOSIS — R531 Weakness: Secondary | ICD-10-CM

## 2015-06-15 DIAGNOSIS — R6889 Other general symptoms and signs: Secondary | ICD-10-CM

## 2015-06-15 DIAGNOSIS — M25562 Pain in left knee: Secondary | ICD-10-CM

## 2015-06-15 DIAGNOSIS — R29898 Other symptoms and signs involving the musculoskeletal system: Secondary | ICD-10-CM | POA: Diagnosis not present

## 2015-06-15 DIAGNOSIS — M25662 Stiffness of left knee, not elsewhere classified: Secondary | ICD-10-CM

## 2015-06-15 NOTE — Patient Instructions (Signed)
Hip Extension    Lying face down, raise leg just off floor. Keep leg straight. Hold 1 count. Lower leg to floor. Repeat _5__ times each leg. 3-4 sets per session.  (goal of working up to 2 sets of 10 reps with good control)   Optional: Place small pillow under abdomen.  Straight Leg Raise: With External Leg Rotation    Lie on back with right leg straight, opposite leg bent. Rotate straight leg out and lift __6-8__ inches. Repeat __5-10__ times per set. Do __2__ sets per session. Do __1__ sessions per day.  http://orth.exer.us/729   Calf Stretch    Place hands on wall at shoulder height. Keeping back leg straight, bend front leg, feet pointing forward, heels flat on floor. Lean forward slightly until stretch is felt in calf of back leg. Hold stretch __20-30_ seconds, breathing slowly in and out. Repeat stretch with other leg back. Do _1-2__ sessions per day. Variation: Use chair or table for support.

## 2015-06-15 NOTE — Therapy (Signed)
[facility] [address] [place], [place], [address] Phone: [phone]   Fax:  [phone]  Physical Therapy Treatment  Patient Details  Name: Patricia Arroyo MRN: [id number] Date of Birth: [birth year] Referring Provider: Dr. Briant Siteshekkekandem   Encounter Date: 06/15/2015      PT End of Session - 06/15/15 1154    Visit Number 2   Number of Visits 12   Date for PT Re-Evaluation 07/20/15   PT Start Time 1150   PT Stop Time 1258   PT Time Calculation (min) 68 min   Activity Tolerance Patient tolerated treatment well      Past Medical History  Diagnosis Date  . Hypertension   . GERD (gastroesophageal reflux disease)   . Depression     h/o---no meds now  . ADHD (attention deficit hyperactivity disorder)   . IBS (irritable bowel syndrome)   . Anxiety   . Carpal tunnel syndrome   . Varicose veins   . Diabetes mellitus without complication [facility](HCC)     Past Surgical History  Procedure Laterality Date  . Foot fasciotomy  2005    both feet  . Dilation and curettage of uterus    . Novasure ablation    . Wisdom tooth extraction    . Colonoscopy    . Laparoscopic assisted vaginal hysterectomy  03/10/2011    Procedure: LAPAROSCOPIC ASSISTED VAGINAL HYSTERECTOMY;  Surgeon: Turner Danielsavid C Lowe, MD;  Location: [facility];  Service: Gynecology;  Laterality: N/A;  . Salpingoophorectomy  03/10/2011    Procedure: SALPINGO OOPHERECTOMY;  Surgeon: Turner Danielsavid C Lowe, MD;  Location: [facility];  Service: Gynecology;  Laterality: Bilateral;  . Anterior and posterior repair  03/10/2011    Procedure: ANTERIOR (CYSTOCELE) AND POSTERIOR REPAIR (RECTOCELE);  Surgeon: Turner Danielsavid C Lowe, MD;  Location: [facility];  Service: Gynecology;  Laterality: N/A;  with Sacrospinous Ligament Suspension  . Carpal tunnel release Bilateral 04/01/2013    Procedure: BILATERAL CARPAL TUNNEL RELEASE;  Surgeon: Tami RibasKevin R Kuzma, MD;  Location: [facility];  Service: Orthopedics;   Laterality: Bilateral;    There were no vitals filed for this visit.  Visit Diagnosis:  Pain in left knee  Stiffness of knee joint, left  Weakness of both lower limbs  Decreased strength, endurance, and mobility      Subjective Assessment - 06/15/15 1154    Subjective Pt reports she has been [place] in [place].  Did some walking on beach. Is doing HEP 1x/day.   Had Lt knee drained prior to trip.     Currently in Pain? Yes   Pain Score 2    Pain Location Knee   Pain Orientation Left   Pain Descriptors / Indicators Sore;Nagging   Aggravating Factors  kneeling, leaning forward to reach   Pain Relieving Factors draining knee at MD, ???            [facility] - 06/15/15 0001    Assessment   Medical Diagnosis Lt distal quadriceps tendinopathy    Referring Provider Dr. Briant Siteshekkekandem    Onset Date/Surgical Date 01/17/15   Hand Dominance Right   Next MD Visit 06/26/15   Prior Therapy none                      [facility] Adult PT Treatment/Exercise - 06/15/15 0001    Knee/Hip Exercises: Stretches   Passive Hamstring Stretch Left, Rt ;3 reps;30 seconds   Quad Stretch 3 reps;30 seconds;Left;Right   ITB  Stretch Right;Left;2 reps;30 seconds  also sidelying with LLE off table x 2 reps   Piriformis Stretch 3 reps;30 seconds each side    Gastroc Stretch 2 reps;Right;Left;30 seconds   Knee/Hip Exercises: Aerobic   Nustep L3: 5 min    Knee/Hip Exercises: Supine   Straight Leg Raise with External Rotation Left;2 sets;10 reps   Knee/Hip Exercises: Prone   Hip Extension Strengthening;Left;Right;5 reps;3 sets   Hip Extension Limitations (challenging in LLE)   Modalities   Modalities Cryotherapy;Electrical Stimulation   Cryotherapy   Number Minutes Cryotherapy 15 Minutes   Cryotherapy Location Knee   Type of Cryotherapy Ice pack   Electrical Stimulation   Electrical Stimulation Location Lt quad and superior knee   Electrical Stimulation Action TFC    Electrical Stimulation Parameters to tolerance    Electrical Stimulation Goals Pain      Manual therapy:  Edge tool assistance for soft tissue mobilization to Lt quad to decrease muscular tightness and pain.  Then TPR and light muscle stripping to same area.  Pt tight and point tender both proximally and distally.            PT Education - 06/15/15 1250    Education provided Yes   Education Details HEP    Person(s) Educated Patient   Methods Explanation;Handout   Comprehension Returned demonstration;Verbalized understanding             PT Long Term Goals - 06/15/15 1252    PT LONG TERM GOAL #1   Title Increase Lt knee ROM to 128 to 130 deg flexion to allow pt to perform normal functional activities 07/20/15   Time 6   Period Weeks   Status On-going   PT LONG TERM GOAL #2   Title Decrease pain allowing patient to kneel on padded surface with pain 0/10 to 2/10 at most 07/20/15   Time 6   Period Weeks   Status On-going   PT LONG TERM GOAL #3   Title Patient to ascend and descend steps without difficulty 07/20/15   Time 6   Status On-going   PT LONG TERM GOAL #4   Title I in HEP 07/20/15   Time 6   Period Weeks   Status On-going   PT LONG TERM GOAL #5   Title Improve FOTO to </= 39% limitation 07/20/15   Time 6   Period Weeks   Status On-going               Plan - 06/15/15 1248    Clinical Impression Statement Pt required some verbal and tactile cues to correct form on exercise; tolerated with minimal to no increase in pain.  Pt very point tender with manual therapy to Lt quad.  Pt progressing towards goals.   Pt will benefit from skilled therapeutic intervention in order to improve on the following deficits Postural dysfunction;Improper body mechanics;Pain;Decreased range of motion;Decreased mobility;Decreased strength;Decreased endurance;Decreased activity tolerance   Rehab Potential Good   PT Frequency 2x / week   PT Duration 6 weeks   PT  Treatment/Interventions Patient/family education;ADLs/Self Care Home Management;Therapeutic exercise;Therapeutic activities;Balance training;Neuromuscular re-education;Manual techniques;Dry needling;Cryotherapy;Electrical Stimulation;Iontophoresis /ml Dexamethasone;Moist Heat;Ultrasound   PT Next Visit Plan Manual therapy through quads.  continue stretching to LLE.  Measure knee ROM.   Consulted and Agree with Plan of Care Patient        Problem List Patient Active Problem List   Diagnosis Date Noted  . Left distal quadriceps tendinopathy, exertional 05/27/2015  . Change in nail  appearance 05/25/2015  . Change in voice 05/25/2015  . Prepatellar bursitis of left knee 02/06/2015  . Nocturnal leg cramps 02/06/2015  . Diabetes type 2, controlled (HCC) 01/13/2015  . Fatty liver disease, nonalcoholic 01/13/2015  . Varicose veins of leg with complications 05/12/2014  . Essential hypertension 04/20/2014  . IBS (irritable bowel syndrome) 04/20/2014  . GERD (gastroesophageal reflux disease) 04/20/2014  . Obesity 04/20/2014  . Varicose veins without complication 02/03/2014    Mayer Camel, PTA 06/15/2015 12:53 PM  [facility] [place] [address] [place], [place], [address] Phone: [phone]   Fax:  [phone]  Name: CELE MOTE MRN: [id number] Date of Birth: 1965-05-16

## 2015-06-16 ENCOUNTER — Other Ambulatory Visit: Payer: Self-pay | Admitting: *Deleted

## 2015-06-16 MED ORDER — DULAGLUTIDE 1.5 MG/0.5ML ~~LOC~~ SOAJ: 1.5000 mg | SUBCUTANEOUS | Status: DC

## 2015-06-18 ENCOUNTER — Encounter: Payer: Self-pay | Admitting: Physical Therapy

## 2015-06-19 ENCOUNTER — Ambulatory Visit (INDEPENDENT_AMBULATORY_CARE_PROVIDER_SITE_OTHER): Payer: Worker's Compensation | Admitting: Physical Therapy

## 2015-06-19 DIAGNOSIS — M25662 Stiffness of left knee, not elsewhere classified: Secondary | ICD-10-CM | POA: Diagnosis not present

## 2015-06-19 DIAGNOSIS — Z7409 Other reduced mobility: Secondary | ICD-10-CM | POA: Diagnosis not present

## 2015-06-19 DIAGNOSIS — R531 Weakness: Secondary | ICD-10-CM

## 2015-06-19 DIAGNOSIS — R29898 Other symptoms and signs involving the musculoskeletal system: Secondary | ICD-10-CM | POA: Diagnosis not present

## 2015-06-19 DIAGNOSIS — M25562 Pain in left knee: Secondary | ICD-10-CM

## 2015-06-19 DIAGNOSIS — R6889 Other general symptoms and signs: Secondary | ICD-10-CM

## 2015-06-19 NOTE — Therapy (Signed)
[facility] [place] [place], [place], [address] Phone: [phone]   Fax:  [phone]  Physical Therapy Treatment  Patient Details  Name: Patricia Arroyo MRN: [id number] Date of Birth: 1965-08-20 Referring Provider: Dr. Helane Rima   Encounter Date: 06/19/2015      PT End of Session - 06/19/15 1339    Visit Number 3   Number of Visits 12   Date for PT Re-Evaluation 07/20/15   PT Start Time 1332   PT Stop Time 1412   PT Time Calculation (min) 40 min   Activity Tolerance Patient tolerated treatment well      Past Medical History  Diagnosis Date  . Hypertension   . GERD (gastroesophageal reflux disease)   . Depression     h/o---no meds now  . ADHD (attention deficit hyperactivity disorder)   . IBS (irritable bowel syndrome)   . Anxiety   . Carpal tunnel syndrome   . Varicose veins   . Diabetes mellitus without complication [facility])     Past Surgical History  Procedure Laterality Date  . Foot fasciotomy  2005    both feet  . Dilation and curettage of uterus    . Novasure ablation    . Wisdom tooth extraction    . Colonoscopy    . Laparoscopic assisted vaginal hysterectomy  03/10/2011    Procedure: LAPAROSCOPIC ASSISTED VAGINAL HYSTERECTOMY;  Surgeon: Luz Lex, MD;  Location: [facility];  Service: Gynecology;  Laterality: N/A;  . Salpingoophorectomy  03/10/2011    Procedure: SALPINGO OOPHERECTOMY;  Surgeon: Luz Lex, MD;  Location: [facility];  Service: Gynecology;  Laterality: Bilateral;  . Anterior and posterior repair  03/10/2011    Procedure: ANTERIOR (CYSTOCELE) AND POSTERIOR REPAIR (RECTOCELE);  Surgeon: Luz Lex, MD;  Location: [facility];  Service: Gynecology;  Laterality: N/A;  with Sacrospinous Ligament Suspension  . Carpal tunnel release Bilateral 04/01/2013    Procedure: BILATERAL CARPAL TUNNEL RELEASE;  Surgeon: Tennis Must, MD;  Location: [facility];  Service: Orthopedics;   Laterality: Bilateral;    There were no vitals filed for this visit.  Visit Diagnosis:  Pain in left knee  Stiffness of knee joint, left  Weakness of both lower limbs  Decreased strength, endurance, and mobility      Subjective Assessment - 06/19/15 1339    Subjective Pt reports she is unable to kneel still which is frustrating her.  Has been doing HEP 1x/day.  Brought TENS and would like education on use/application   Currently in Pain? No/denies            [facility] PT Assessment - 06/19/15 0001    Assessment   Medical Diagnosis Lt distal quadriceps tendinopathy    Onset Date/Surgical Date 01/17/15   Hand Dominance Right   Prior Therapy none    AROM   Right Knee Extension 0   Right Knee Flexion 140   Left Knee Extension 0   Left Knee Flexion 136                     [facility] Adult PT Treatment/Exercise - 06/19/15 0001    Self-Care   Self-Care Other Self-Care Comments   Other Self-Care Comments  educated pt on use of TENS unit (contraindications and how to operate unit)   Knee/Hip Exercises: Stretches   Passive Hamstring Stretch Left;3 reps;30 seconds   Quad Stretch 3 reps;30 seconds;Left;Right   ITB Stretch Right;Left;2 reps;30 seconds  also  sidelying with LLE off table x 2 reps   Gastroc Stretch 2 reps;Right;Left;30 seconds   Knee/Hip Exercises: Aerobic   Nustep L4: 5 min    Modalities   Modalities Moist Heat;Electrical Stimulation;Ultrasound   Moist Heat Therapy   Number Minutes Moist Heat 10 Minutes   Moist Heat Location --  Lt thigh   Electrical Stimulation   Electrical Stimulation Location Lt quad and superior knee   Electrical Stimulation Action IFC   Electrical Stimulation Parameters to tolerance    Electrical Stimulation Goals Pain   Ultrasound   Ultrasound Location Quad tendon Lt   Ultrasound Parameters 100%, 3.3 mHz, 1.1 w/cm2 x 8 min    Ultrasound Goals Pain   Manual Therapy   Manual Therapy Myofascial release;Soft tissue mobilization    Soft tissue mobilization to Lt quad and quad tendon.    Myofascial Release to Lt quad                      PT Long Term Goals - 06/19/15 1643    PT LONG TERM GOAL #1   Title Increase Lt knee ROM to 128 to 130 deg flexion to allow pt to perform normal functional activities 07/20/15   Time 6   Period Weeks   Status Achieved   PT LONG TERM GOAL #2   Title Decrease pain allowing patient to kneel on padded surface with pain 0/10 to 2/10 at most 07/20/15   Time 6   Period Weeks   Status On-going   PT LONG TERM GOAL #3   Title Patient to ascend and descend steps without difficulty 07/20/15   Time 6   Period Weeks   Status On-going   PT LONG TERM GOAL #4   Title I in HEP 07/20/15   Time 6   Period Weeks   Status On-going   PT LONG TERM GOAL #5   Title Improve FOTO to </= 39% limitation 07/20/15   Period Weeks   Status On-going               Plan - 06/19/15 1640    Clinical Impression Statement Pt demonstrated improved Lt knee ROM; has met LTG #1.  Pt continues to be tight and tender in Lt quad with manual therapy.  Pt verbalized understanding on use and set up of TENS unit.  Gradual progress towards goals.    Pt will benefit from skilled therapeutic intervention in order to improve on the following deficits Postural dysfunction;Improper body mechanics;Pain;Decreased range of motion;Decreased mobility;Decreased strength;Decreased endurance;Decreased activity tolerance   Rehab Potential Good   PT Frequency 2x / week   PT Duration 6 weeks   PT Treatment/Interventions Patient/family education;ADLs/Self Care Home Management;Therapeutic exercise;Therapeutic activities;Balance training;Neuromuscular re-education;Manual techniques;Dry needling;Cryotherapy;Electrical Stimulation;Iontophoresis 27m/ml Dexamethasone;Moist Heat;Ultrasound   PT Next Visit Plan Manual therapy through quads; [place] quad tendon.  continue stretching to LLE.  TDN as time allows.    Consulted and Agree  with Plan of Care Patient        Problem List Patient Active Problem List   Diagnosis Date Noted  . Left distal quadriceps tendinopathy, exertional 05/27/2015  . Change in nail appearance 05/25/2015  . Change in voice 05/25/2015  . Prepatellar bursitis of left knee 02/06/2015  . Nocturnal leg cramps 02/06/2015  . Diabetes type 2, controlled ([place] 01/13/2015  . Fatty liver disease, nonalcoholic 102/72/5366 . Varicose veins of leg with complications 044/06/4740 . Essential hypertension 04/20/2014  . IBS (irritable bowel syndrome) 04/20/2014  .  GERD (gastroesophageal reflux disease) 04/20/2014  . Obesity 04/20/2014  . Varicose veins without complication 93/96/8864   Kerin Perna, PTA 06/19/2015 4:52 PM  [facility] [place], [place], [address] Phone: [phone]   Fax:  [phone]  Name: Patricia Arroyo MRN: [id number] Date of Birth: 08-11-1965

## 2015-06-22 ENCOUNTER — Ambulatory Visit (INDEPENDENT_AMBULATORY_CARE_PROVIDER_SITE_OTHER): Payer: Worker's Compensation | Admitting: Physical Therapy

## 2015-06-22 DIAGNOSIS — R29898 Other symptoms and signs involving the musculoskeletal system: Secondary | ICD-10-CM | POA: Diagnosis not present

## 2015-06-22 DIAGNOSIS — Z7409 Other reduced mobility: Secondary | ICD-10-CM

## 2015-06-22 DIAGNOSIS — M25562 Pain in left knee: Secondary | ICD-10-CM

## 2015-06-22 DIAGNOSIS — R6889 Other general symptoms and signs: Secondary | ICD-10-CM

## 2015-06-22 DIAGNOSIS — M25662 Stiffness of left knee, not elsewhere classified: Secondary | ICD-10-CM | POA: Diagnosis not present

## 2015-06-22 DIAGNOSIS — R531 Weakness: Secondary | ICD-10-CM

## 2015-06-22 NOTE — Therapy (Signed)
[facility] [address] [place], [place], [address] Phone: [phone]   Fax:  [phone]  Physical Therapy Treatment  Patient Details  Name: Patricia BurkeDonna L Groot MRN: [id number] Date of Birth: [birth year] Referring Provider: Dr. Briant Siteshekkekandem  Encounter Date: 06/22/2015      PT End of Session - 06/22/15 0851    Visit Number 4   Number of Visits 12   Date for PT Re-Evaluation 07/20/15   PT Start Time 0847   PT Stop Time 0952   PT Time Calculation (min) 65 min      Past Medical History  Diagnosis Date  . Hypertension   . GERD (gastroesophageal reflux disease)   . Depression     h/o---no meds now  . ADHD (attention deficit hyperactivity disorder)   . IBS (irritable bowel syndrome)   . Anxiety   . Carpal tunnel syndrome   . Varicose veins   . Diabetes mellitus without complication [facility](HCC)     Past Surgical History  Procedure Laterality Date  . Foot fasciotomy  2005    both feet  . Dilation and curettage of uterus    . Novasure ablation    . Wisdom tooth extraction    . Colonoscopy    . Laparoscopic assisted vaginal hysterectomy  03/10/2011    Procedure: LAPAROSCOPIC ASSISTED VAGINAL HYSTERECTOMY;  Surgeon: Turner Danielsavid C Lowe, MD;  Location: [facility];  Service: Gynecology;  Laterality: N/A;  . Salpingoophorectomy  03/10/2011    Procedure: SALPINGO OOPHERECTOMY;  Surgeon: Turner Danielsavid C Lowe, MD;  Location: [facility];  Service: Gynecology;  Laterality: Bilateral;  . Anterior and posterior repair  03/10/2011    Procedure: ANTERIOR (CYSTOCELE) AND POSTERIOR REPAIR (RECTOCELE);  Surgeon: Turner Danielsavid C Lowe, MD;  Location: [facility];  Service: Gynecology;  Laterality: N/A;  with Sacrospinous Ligament Suspension  . Carpal tunnel release Bilateral 04/01/2013    Procedure: BILATERAL CARPAL TUNNEL RELEASE;  Surgeon: Tami RibasKevin R Kuzma, MD;  Location: [facility];  Service: Orthopedics;  Laterality: Bilateral;    There were no vitals filed for  this visit.  Visit Diagnosis:  Pain in left knee  Stiffness of knee joint, left  Weakness of both lower limbs  Decreased strength, endurance, and mobility      Subjective Assessment - 06/22/15 0852    Subjective Pt reports her Lt knee felt pretty good when she left here last session.  Worked all weekend.  She states her knee feels like it's filling up with fluid again.    Currently in Pain? No/denies  "just feels different"             [facility] PT Assessment - 06/22/15 0001    Assessment   Medical Diagnosis Lt distal quadriceps tendinopathy    Referring Provider Dr. Briant Siteshekkekandem   Onset Date/Surgical Date 01/17/15   Hand Dominance Right                     [facility] Adult PT Treatment/Exercise - 06/22/15 0001    Knee/Hip Exercises: Stretches   Passive Hamstring Stretch Left;3 reps;30 seconds   Hip Flexor Stretch Left;30 seconds;3 reps  edge of bed, Rt knee to chest    Piriformis Stretch 3 reps;Right;Left   Gastroc Stretch 2 reps;Right;Left;30 seconds   Knee/Hip Exercises: Aerobic   Nustep L4: 5 min    Modalities   Modalities Moist Heat;Electrical Stimulation;Ultrasound   Moist Heat Therapy   Number Minutes Moist Heat 15 Minutes   Moist Heat Location Hip  Lt thigh   Emergency planning/management officer Lt quad and Lt piriformis   Electrical Stimulation Action premod to each area   Electrical Stimulation Parameters to tolerance    Electrical Stimulation Goals Pain   Ultrasound   Ultrasound Location Quad tendon; mid-proximal lateral quad    Ultrasound Parameters 10%, 3.3 mHz, 0.8w/cm2 x 5 min static to quad tendon;  100%, 1.2 w/cm2 x 5 min to lateral quad muscle   Ultrasound Goals Pain   Manual Therapy   Manual Therapy Myofascial release;Soft tissue mobilization   Soft tissue mobilization muscle stripping to Lt lateral quad    Myofascial Release to Lt mid and lateral quad                      PT Long Term Goals -  06/19/15 1643    PT LONG TERM GOAL #1   Title Increase Lt knee ROM to 128 to 130 deg flexion to allow pt to perform normal functional activities 07/20/15   Time 6   Period Weeks   Status Achieved   PT LONG TERM GOAL #2   Title Decrease pain allowing patient to kneel on padded surface with pain 0/10 to 2/10 at most 07/20/15   Time 6   Period Weeks   Status On-going   PT LONG TERM GOAL #3   Title Patient to ascend and descend steps without difficulty 07/20/15   Time 6   Period Weeks   Status On-going   PT LONG TERM GOAL #4   Title I in HEP 07/20/15   Time 6   Period Weeks   Status On-going   PT LONG TERM GOAL #5   Title Improve FOTO to </= 39% limitation 07/20/15   Period Weeks   Status On-going               Plan - 06/22/15 0939    Clinical Impression Statement Pt continues with tight / tender Lt lateral quad with manual therapy, slowly softening and less tender than last session.  Pt had some pain in Lt piriformis area today, when in hooklying position. Added piriformis and hip flexor stretch to her HEP.  Progressing towards goals.    Pt will benefit from skilled therapeutic intervention in order to improve on the following deficits Postural dysfunction;Improper body mechanics;Pain;Decreased range of motion;Decreased mobility;Decreased strength;Decreased endurance;Decreased activity tolerance   Rehab Potential Good   PT Frequency 2x / week   PT Duration 6 weeks   PT Treatment/Interventions Patient/family education;ADLs/Self Care Home Management;Therapeutic exercise;Therapeutic activities;Balance training;Neuromuscular re-education;Manual techniques;Dry needling;Cryotherapy;Electrical Stimulation;Iontophoresis /ml Dexamethasone;Moist Heat;Ultrasound   PT Next Visit Plan Manual therapy through quads; [place] to quad tendon.  continue stretching to LLE.  TDN as time allows. Trial of stair training/ eccentric quad work.    Consulted and Agree with Plan of Care Patient         Problem List Patient Active Problem List   Diagnosis Date Noted  . Left distal quadriceps tendinopathy, exertional 05/27/2015  . Change in nail appearance 05/25/2015  . Change in voice 05/25/2015  . Prepatellar bursitis of left knee 02/06/2015  . Nocturnal leg cramps 02/06/2015  . Diabetes type 2, controlled (HCC) 01/13/2015  . Fatty liver disease, nonalcoholic 01/13/2015  . Varicose veins of leg with complications 05/12/2014  . Essential hypertension 04/20/2014  . IBS (irritable bowel syndrome) 04/20/2014  . GERD (gastroesophageal reflux disease) 04/20/2014  . Obesity 04/20/2014  . Varicose veins without complication 02/03/2014  Mayer Camel, PTA 06/22/2015 11:32 AM  [facility] [address] [place], [place], [address] Phone: [phone]   Fax:  [phone]  Name: TARINI CARRIER MRN: [id number] Date of Birth: [birth year]

## 2015-06-25 ENCOUNTER — Ambulatory Visit (INDEPENDENT_AMBULATORY_CARE_PROVIDER_SITE_OTHER): Payer: Worker's Compensation | Admitting: Physical Therapy

## 2015-06-25 DIAGNOSIS — M25662 Stiffness of left knee, not elsewhere classified: Secondary | ICD-10-CM

## 2015-06-25 DIAGNOSIS — M25562 Pain in left knee: Secondary | ICD-10-CM | POA: Diagnosis not present

## 2015-06-25 DIAGNOSIS — Z7409 Other reduced mobility: Secondary | ICD-10-CM

## 2015-06-25 DIAGNOSIS — R29898 Other symptoms and signs involving the musculoskeletal system: Secondary | ICD-10-CM | POA: Diagnosis not present

## 2015-06-25 DIAGNOSIS — R6889 Other general symptoms and signs: Secondary | ICD-10-CM

## 2015-06-25 DIAGNOSIS — R531 Weakness: Secondary | ICD-10-CM

## 2015-06-25 NOTE — Patient Instructions (Signed)
Anterior Step-Down    Stand with both feet on __3_ inch step. Tap Right heel to the floor, or step down, and return _10__ times. __1-2_ sets _1__ times per day.  http://gglj.exer.us/185    [facility] [address] [place], [place] [address]  [phone]) [phone] (fax)

## 2015-06-25 NOTE — Therapy (Signed)
[facility] [place] [address] [place], [place], [address] Phone: [phone]   Fax:  [phone]  Physical Therapy Treatment  Patient Details  Name: Patricia Arroyo MRN: [id number] Date of Birth: 01/30/1966 Referring Provider: Dr. Briant Sites  Encounter Date: 06/25/2015      PT End of Session - 06/25/15 1114    Visit Number 5   Number of Visits 12   Date for PT Re-Evaluation 07/20/15   PT Start Time 1105  pt arrived late   PT Stop Time 1150   PT Time Calculation (min) 45 min      Past Medical History  Diagnosis Date  . Hypertension   . GERD (gastroesophageal reflux disease)   . Depression     h/o---no meds now  . ADHD (attention deficit hyperactivity disorder)   . IBS (irritable bowel syndrome)   . Anxiety   . Carpal tunnel syndrome   . Varicose veins   . Diabetes mellitus without complication [facility])     Past Surgical History  Procedure Laterality Date  . Foot fasciotomy  2005    both feet  . Dilation and curettage of uterus    . Novasure ablation    . Wisdom tooth extraction    . Colonoscopy    . Laparoscopic assisted vaginal hysterectomy  03/10/2011    Procedure: LAPAROSCOPIC ASSISTED VAGINAL HYSTERECTOMY;  Surgeon: Turner Daniels, MD;  Location: [facility];  Service: Gynecology;  Laterality: N/A;  . Salpingoophorectomy  03/10/2011    Procedure: SALPINGO OOPHERECTOMY;  Surgeon: Turner Daniels, MD;  Location: [facility];  Service: Gynecology;  Laterality: Bilateral;  . Anterior and posterior repair  03/10/2011    Procedure: ANTERIOR (CYSTOCELE) AND POSTERIOR REPAIR (RECTOCELE);  Surgeon: Turner Daniels, MD;  Location: [facility];  Service: Gynecology;  Laterality: N/A;  with Sacrospinous Ligament Suspension  . Carpal tunnel release Bilateral 04/01/2013    Procedure: BILATERAL CARPAL TUNNEL RELEASE;  Surgeon: Tami Ribas, MD;  Location: [facility];  Service: Orthopedics;  Laterality: Bilateral;    There were no  vitals filed for this visit.  Visit Diagnosis:  Pain in left knee  Stiffness of knee joint, left  Weakness of both lower limbs  Decreased strength, endurance, and mobility      Subjective Assessment - 06/25/15 1115    Subjective Pt reports she feels like her Lt knee is filling up with fluid again.  Once a day she has been performing her stretches.  Hasn't tried self massage "I have just been so busy"   Currently in Pain? Yes   Pain Score 3    Pain Location Knee   Pain Orientation Left   Pain Descriptors / Indicators Aching;Nagging   Aggravating Factors  kneeling   Pain Relieving Factors ??            [facility] PT Assessment - 06/25/15 0001    Assessment   Medical Diagnosis Lt distal quadriceps tendinopathy    Referring Provider Dr. Briant Sites   Onset Date/Surgical Date 01/17/15   Hand Dominance Right   Flexibility   Quadriceps Rt heel to buttock ~3 inches; Lt 5 inches from buttock          [facility] Adult PT Treatment/Exercise - 06/25/15 0001    Knee/Hip Exercises: Stretches   Quad Stretch 5 reps;Right;Left;30 seconds  Lt side with towel under knee   Knee/Hip Exercises: Aerobic   Nustep L4: 6 min    Knee/Hip Exercises: Standing   Step Down  Right;1 set;10 reps;Hand Hold: 2;Step Height: 6"   Step Down Limitations Heel taps with Rt heel on 3" step UE support x 12 reps. Demonstration and VC for improved form.  Repeated with RLE to compare strength.   Modalities   Modalities Iontophoresis;Ultrasound   Ultrasound   Ultrasound Location Lt mid quad    Ultrasound Parameters 100%, 1.0 mHz ,1.1 w/cm2 x 8 min    Ultrasound Goals Pain  tightness   Iontophoresis   Type of Iontophoresis Dexamethasone   Location Lt quad tendon   Dose 1.0 cc    Time 6 hr patch    Manual Therapy   Manual Therapy Soft tissue mobilization   Soft tissue mobilization roller to Lt quad muscle to increase blood flow/ decrease tightness.            PT Education - 06/25/15 1159    Education  provided Yes   Education Details info on ionto; HEP    Person(s) Educated Patient   Methods Explanation;Handout   Comprehension Returned demonstration;Verbalized understanding             PT Long Term Goals - 06/19/15 1643    PT LONG TERM GOAL #1   Title Increase Lt knee ROM to 128 to 130 deg flexion to allow pt to perform normal functional activities 07/20/15   Time 6   Period Weeks   Status Achieved   PT LONG TERM GOAL #2   Title Decrease pain allowing patient to kneel on padded surface with pain 0/10 to 2/10 at most 07/20/15   Time 6   Period Weeks   Status On-going   PT LONG TERM GOAL #3   Title Patient to ascend and descend steps without difficulty 07/20/15   Time 6   Period Weeks   Status On-going   PT LONG TERM GOAL #4   Title I in HEP 07/20/15   Time 6   Period Weeks   Status On-going   PT LONG TERM GOAL #5   Title Improve FOTO to </= 39% limitation 07/20/15   Period Weeks   Status On-going               Plan - 06/25/15 1156    Clinical Impression Statement Pt demonstrated improved quad flexibility since last visit.  Continues with tightness/tenderness in mid Lt quad with manual; slowly improving.  Pt challenged by standing Lt eccentric quad work. Trial started of ionto to Lt quad tendon.  Progressing towards goals.    Pt will benefit from skilled therapeutic intervention in order to improve on the following deficits Postural dysfunction;Improper body mechanics;Pain;Decreased range of motion;Decreased mobility;Decreased strength;Decreased endurance;Decreased activity tolerance   Rehab Potential Good   PT Frequency 2x / week   PT Duration 6 weeks   PT Treatment/Interventions Patient/family education;ADLs/Self Care Home Management;Therapeutic exercise;Therapeutic activities;Balance training;Neuromuscular re-education;Manual techniques;Dry needling;Cryotherapy;Electrical Stimulation;Iontophoresis /ml Dexamethasone;Moist Heat;Ultrasound   PT Next Visit Plan  Assess response to ionto and current progress towards goals.  Request additional visits from insurance if needed.  Progress HEP as tolerated   Consulted and Agree with Plan of Care Patient        Problem List Patient Active Problem List   Diagnosis Date Noted  . Left distal quadriceps tendinopathy, exertional 05/27/2015  . Change in nail appearance 05/25/2015  . Change in voice 05/25/2015  . Prepatellar bursitis of left knee 02/06/2015  . Nocturnal leg cramps 02/06/2015  . Diabetes type 2, controlled (HCC) 01/13/2015  . Fatty liver disease, nonalcoholic 01/13/2015  .  Varicose veins of leg with complications 05/12/2014  . Essential hypertension 04/20/2014  . IBS (irritable bowel syndrome) 04/20/2014  . GERD (gastroesophageal reflux disease) 04/20/2014  . Obesity 04/20/2014  . Varicose veins without complication 02/03/2014   Mayer CamelJennifer Carlson-Long, PTA 06/25/2015 12:00 PM  [facility] [address] [place], [place], [address] Phone: [phone]   Fax:  [phone]  Name: Patricia Arroyo MRN: [id number] Date of Birth: 1965-06-08

## 2015-06-26 ENCOUNTER — Ambulatory Visit: Payer: Self-pay | Admitting: Sports Medicine

## 2015-06-29 ENCOUNTER — Encounter: Payer: Self-pay | Admitting: Sports Medicine

## 2015-06-29 ENCOUNTER — Ambulatory Visit (INDEPENDENT_AMBULATORY_CARE_PROVIDER_SITE_OTHER): Payer: Worker's Compensation | Admitting: Sports Medicine

## 2015-06-29 ENCOUNTER — Ambulatory Visit (INDEPENDENT_AMBULATORY_CARE_PROVIDER_SITE_OTHER): Payer: Worker's Compensation | Admitting: Physical Therapy

## 2015-06-29 DIAGNOSIS — Z7409 Other reduced mobility: Secondary | ICD-10-CM | POA: Diagnosis not present

## 2015-06-29 DIAGNOSIS — R29898 Other symptoms and signs involving the musculoskeletal system: Secondary | ICD-10-CM

## 2015-06-29 DIAGNOSIS — M25562 Pain in left knee: Secondary | ICD-10-CM

## 2015-06-29 DIAGNOSIS — M25662 Stiffness of left knee, not elsewhere classified: Secondary | ICD-10-CM

## 2015-06-29 DIAGNOSIS — R531 Weakness: Secondary | ICD-10-CM

## 2015-06-29 DIAGNOSIS — M769 Unspecified enthesopathy, lower limb, excluding foot: Secondary | ICD-10-CM

## 2015-06-29 DIAGNOSIS — R6889 Other general symptoms and signs: Secondary | ICD-10-CM

## 2015-06-29 DIAGNOSIS — M76899 Other specified enthesopathies of unspecified lower limb, excluding foot: Secondary | ICD-10-CM

## 2015-06-29 NOTE — Therapy (Addendum)
[place] [address], [place], [address] Phone: [phone]   Fax:  [phone]  Physical Therapy Treatment  Patient Details  Name: Patricia Arroyo MRN: [id number] Date of Birth: Oct 26, 1965 Referring Provider: Dr. Helane Rima  Encounter Date: 06/29/2015      PT End of Session - 06/29/15 1155    Visit Number 6   Number of Visits 12   Date for PT Re-Evaluation 07/20/15   PT Start Time 1152  pt arrived   PT Stop Time 1256   PT Time Calculation (min) 64 min   Activity Tolerance Patient tolerated treatment well      Past Medical History  Diagnosis Date  . Hypertension   . GERD (gastroesophageal reflux disease)   . Depression     h/o---no meds now  . ADHD (attention deficit hyperactivity disorder)   . IBS (irritable bowel syndrome)   . Anxiety   . Carpal tunnel syndrome   . Varicose veins   . Diabetes mellitus without complication [facility])     Past Surgical History  Procedure Laterality Date  . Foot fasciotomy  2005    both feet  . Dilation and curettage of uterus    . Novasure ablation    . Wisdom tooth extraction    . Colonoscopy    . Laparoscopic assisted vaginal hysterectomy  03/10/2011    Procedure: LAPAROSCOPIC ASSISTED VAGINAL HYSTERECTOMY;  Surgeon: Luz Lex, MD;  Location: [place] [facility];  Service: Gynecology;  Laterality: N/A;  . Salpingoophorectomy  03/10/2011    Procedure: SALPINGO OOPHERECTOMY;  Surgeon: Luz Lex, MD;  Location: [facility];  Service: Gynecology;  Laterality: Bilateral;  . Anterior and posterior repair  03/10/2011    Procedure: ANTERIOR (CYSTOCELE) AND POSTERIOR REPAIR (RECTOCELE);  Surgeon: Luz Lex, MD;  Location: [facility];  Service: Gynecology;  Laterality: N/A;  with Sacrospinous Ligament Suspension  . Carpal tunnel release Bilateral 04/01/2013    Procedure: BILATERAL CARPAL TUNNEL RELEASE;  Surgeon: Tennis Must, MD;  Location: [place];  Service:  Orthopedics;  Laterality: Bilateral;    There were no vitals filed for this visit.  Visit Diagnosis:  Pain in left knee  Stiffness of knee joint, left  Weakness of both lower limbs  Decreased strength, endurance, and mobility      Subjective Assessment - 06/29/15 1156    Subjective Pt reports she had some soreness (2/10) with stepping exercise at home.  Kneeling still hurts in Lt knee (trial over wkend cleaning shower stall).  Still hasn't done any self massage to Lt quad "I've been too busy".  Pt had no skin irritation with ionto; felt it helped.    Currently in Pain? Yes   Pain Score 2    Pain Location Knee   Pain Orientation Left   Pain Descriptors / Indicators Discomfort   Aggravating Factors  kneeling    Pain Relieving Factors ionto patch, ice             [facility] PT Assessment - 06/29/15 0001    Assessment   Medical Diagnosis Lt distal quadriceps tendinopathy    Referring Provider Dr. Helane Rima   Onset Date/Surgical Date 01/17/15   Hand Dominance Right   Next MD Visit 06/29/15   Flexibility   Quadriceps Rt heel to buttock ~3 inches; Lt 4 inches from buttock         [facility] Adult PT Treatment/Exercise - 06/29/15 0001    Knee/Hip Exercises: [employer]  Stretch 5 reps;Right;Left;30 seconds  Lt side with towel under knee   ITB Stretch Left;3 reps;30 seconds   Knee/Hip Exercises: Aerobic   Nustep L4: 5 min    Knee/Hip Exercises: Standing   Lateral Step Up Left;1 set;10 reps;Hand Hold: 2;Step Height: 8"   Forward Step Up Left;1 set;15 reps;Hand Hold: 1;Step Height: 8"   Step Down Right;1 set;5 reps;Hand Hold: 1;Step Height: 8"   SLS 3 trials with Lt SLS    Cryotherapy   Number Minutes Cryotherapy 12 Minutes   Cryotherapy Location Knee   Type of Cryotherapy Ice pack   Ultrasound   Ultrasound Location Lt quad tendon and mid quad    Ultrasound Parameters 50%, 3.3 mHz @ quad tendon, 100% 1.0 mHz at mid-quad,  1.2 w/cm2 x 10 min total   Ultrasound Goals Pain   tightness   Manual Therapy   Manual Therapy Myofascial release   Soft tissue mobilization roller to Lt quad muscle to increase blood flow/ decrease tightness.    Myofascial Release to Lt mid and lateral quad           PT Long Term Goals - 06/29/15 1206    PT LONG TERM GOAL #1   Title Increase Lt knee ROM to 128 to 130 deg flexion to allow pt to perform normal functional activities 07/20/15   Time 6   Period Weeks   Status Achieved   PT LONG TERM GOAL #2   Title Decrease pain allowing patient to kneel on padded surface with pain 0/10 to 2/10 at most 07/20/15   Time 6   Period Weeks   Status On-going   PT LONG TERM GOAL #3   Title Patient to ascend and descend steps without difficulty 07/20/15   Time 6   Period Weeks   Status Partially Met   PT LONG TERM GOAL #4   Title I in HEP 07/20/15   Time 6   Period Weeks   Status On-going   PT LONG TERM GOAL #5   Title Improve FOTO to </= 39% limitation 07/20/15   Time 6   Period Weeks   Status --  49% limited 06/29/15               Plan - 06/29/15 1311    Clinical Impression Statement Pt demonstrated improved quad flexibility since last visit; 4" improvement since beginning therapy.  Pt continues with tightness/tenderness  in mid-lateral quad; slowly improving with manual therapy.  Pt tolerated increased step height without increased pain.  Pt progressing towards established goals; will benefit from continued PT intervention to max functional mobility independence.     Pt will benefit from skilled therapeutic intervention in order to improve on the following deficits Postural dysfunction;Improper body mechanics;Pain;Decreased range of motion;Decreased mobility;Decreased strength;Decreased endurance;Decreased activity tolerance   Rehab Potential Good   PT Frequency 2x / week   PT Duration 6 weeks   PT Treatment/Interventions Patient/family education;ADLs/Self Care Home Management;Therapeutic exercise;Therapeutic  activities;Balance training;Neuromuscular re-education;Manual techniques;Dry needling;Cryotherapy;Electrical Stimulation;Iontophoresis 51m/ml Dexamethasone;Moist Heat;Ultrasound   PT Next Visit Plan Continue progressive stretching/strengthening to Lt quad; manual therapy and ionto to quad/quad tendon. Progress HEP as tolerated.    Consulted and Agree with Plan of Care Patient        Problem List Patient Active Problem List   Diagnosis Date Noted  . Left distal quadriceps tendinopathy, exertional 05/27/2015  . Change in nail appearance 05/25/2015  . Change in voice 05/25/2015  . Prepatellar bursitis of left knee 02/06/2015  .  Nocturnal leg cramps 02/06/2015  . Diabetes type 2, controlled ([place]) 01/13/2015  . Fatty liver disease, nonalcoholic 56/46/9806  . Varicose veins of leg with complications 07/89/5011  . Essential hypertension 04/20/2014  . IBS (irritable bowel syndrome) 04/20/2014  . GERD (gastroesophageal reflux disease) 04/20/2014  . Obesity 04/20/2014  . Varicose veins without complication 56/71/6408    Kerin Perna, PTA 06/29/2015 5:15 PM  [place], [place], [address] Phone: [phone]   Fax:  [phone]  Name: Patricia Arroyo MRN: [id number] Date of Birth: [birth year]    PHYSICAL THERAPY DISCHARGE SUMMARY  Visits from Start of Care: 6  Current functional level related to goals / functional outcomes: Good improvement in flexibility and strength LE's    Remaining deficits: Discomfort with kneeling    Education / Equipment: HEP  Plan: Patient agrees to discharge.  Patient goals were partially met. Patient is being discharged due to not returning since the last visit.  ?????    Celyn P. Helene Kelp PT, MPH 09/24/2015 11:35 AM

## 2015-06-29 NOTE — Progress Notes (Signed)
  Subjective:    CC: follow-up  HPI: Left quadriceps tendinopathy: Improved significantly, unfortunately still has some pain, has self discontinued her topical nitroglycerin due to headaches but only took it for a week.she has had greater than 6 weeks of aggressive formal physical therapy, injections, NSAIDs.  Prepatellar bursitis: Resolved after aspiration and injection.  Past medical history, Surgical history, Family history not pertinant except as noted below, Social history, Allergies, and medications have been entered into the medical record, reviewed, and no changes needed.   Review of Systems: No fevers, chills, night sweats, weight loss, chest pain, or shortness of breath.   Objective:    General: Well Developed, well nourished, and in no acute distress.  Neuro: Alert and oriented x3, extra-ocular muscles intact, sensation grossly intact.  HEENT: Normocephalic, atraumatic, pupils equal round reactive to light, neck supple, no masses, no lymphadenopathy, thyroid nonpalpable.  Skin: Warm and dry, no rashes. Cardiac: Regular rate and rhythm, no murmurs rubs or gallops, no lower extremity edema.  Respiratory: Clear to auscultation bilaterally. Not using accessory muscles, speaking in full sentences. Left Knee: Normal to inspection with no erythema or effusion or obvious bony abnormalities. Tender to palpation at the distal quadriceps insertion ROM normal in flexion and extension and lower leg rotation. Ligaments with solid consistent endpoints including ACL, PCL, LCL, MCL. Negative Mcmurray's and provocative meniscal tests. Non painful patellar compression. Patellar and quadriceps tendons unremarkable. Hamstring and quadriceps strength is normal.  Impression and Recommendations:

## 2015-06-29 NOTE — Assessment & Plan Note (Signed)
Prepatellar bursitis has resolved after aspiration and injection.  Still with some pain at the distal quadriceps insertion, working with physical therapy. Self discontinued nitroglycerin due to headache, she will restart this. Considering symptoms have been present for greater than 6 weeks despite aggressive formal physical therapy, injections, we are going to obtain an MRI to confirm the diagnosis.

## 2015-07-06 ENCOUNTER — Ambulatory Visit: Payer: Worker's Compensation

## 2015-07-07 ENCOUNTER — Other Ambulatory Visit: Payer: Self-pay

## 2015-07-07 DIAGNOSIS — E119 Type 2 diabetes mellitus without complications: Secondary | ICD-10-CM

## 2015-07-07 MED ORDER — GLUCOSE BLOOD VI STRP: ORAL_STRIP | Status: DC

## 2015-07-20 ENCOUNTER — Ambulatory Visit (INDEPENDENT_AMBULATORY_CARE_PROVIDER_SITE_OTHER): Payer: Worker's Compensation

## 2015-07-20 DIAGNOSIS — M94262 Chondromalacia, left knee: Secondary | ICD-10-CM | POA: Diagnosis not present

## 2015-07-27 ENCOUNTER — Telehealth: Payer: Self-pay

## 2015-07-27 DIAGNOSIS — M76899 Other specified enthesopathies of unspecified lower limb, excluding foot: Secondary | ICD-10-CM

## 2015-07-27 NOTE — Telephone Encounter (Signed)
Lupita LeashDonna called and states if you want her to continue with physical therapy you will need to put in a new order. Please advise.

## 2015-07-27 NOTE — Telephone Encounter (Signed)
Done

## 2015-08-04 ENCOUNTER — Ambulatory Visit (INDEPENDENT_AMBULATORY_CARE_PROVIDER_SITE_OTHER): Payer: Worker's Compensation | Admitting: Sports Medicine

## 2015-08-04 VITALS — BP 114/81 | HR 106 | Resp 18 | Wt 203.2 lb

## 2015-08-04 DIAGNOSIS — M1712 Unilateral primary osteoarthritis, left knee: Secondary | ICD-10-CM

## 2015-08-04 NOTE — Progress Notes (Signed)
  Subjective:    CC: Follow-up  HPI: Left knee pain: Has undergone many sessions of physical therapy, had persistent pains we obtained an MRI, MRI results be dictated below, pain is predominantly patellofemoral and with kneeling. Has never had a joint injection, pain is moderate, persistent.  Past medical history, Surgical history, Family history not pertinant except as noted below, Social history, Allergies, and medications have been entered into the medical record, reviewed, and no changes needed.   Review of Systems: No fevers, chills, night sweats, weight loss, chest pain, or shortness of breath.   Objective:    General: Well Developed, well nourished, and in no acute distress.  Neuro: Alert and oriented x3, extra-ocular muscles intact, sensation grossly intact.  HEENT: Normocephalic, atraumatic, pupils equal round reactive to light, neck supple, no masses, no lymphadenopathy, thyroid nonpalpable.  Skin: Warm and dry, no rashes. Cardiac: Regular rate and rhythm, no murmurs rubs or gallops, no lower extremity edema.  Respiratory: Clear to auscultation bilaterally. Not using accessory muscles, speaking in full sentences.  Procedure: Real-time Ultrasound Guided Injection of left knee Device: GE Logiq E  Verbal informed consent obtained.  Time-out conducted.  Noted no overlying erythema, induration, or other signs of local infection.  Skin prepped in a sterile fashion.  Local anesthesia: Topical Ethyl chloride.  With sterile technique and under real time ultrasound guidance:  1 mL kenalog 40, 2 mL lidocaine, 2 mL Marcaine injected easily into the suprapatellar recess. Completed without difficulty  Pain immediately resolved suggesting accurate placement of the medication.  Advised to call if fevers/chills, erythema, induration, drainage, or persistent bleeding.  Images permanently stored and available for review in the ultrasound unit.  Impression: Technically successful ultrasound  guided injection.  Impression and Recommendations:

## 2015-08-04 NOTE — Assessment & Plan Note (Signed)
MRI showed knee osteoarthritis with degenerative meniscal tear. Knee joint injection as above, return in one month, discontinue nitroglycerin.

## 2015-08-05 ENCOUNTER — Ambulatory Visit: Payer: Self-pay | Admitting: Rehabilitative and Restorative Service Providers"

## 2015-08-06 ENCOUNTER — Ambulatory Visit: Payer: Self-pay | Admitting: Sports Medicine

## 2015-08-28 ENCOUNTER — Ambulatory Visit: Payer: Self-pay | Admitting: Sports Medicine

## 2015-09-01 ENCOUNTER — Ambulatory Visit: Payer: Self-pay | Admitting: Sports Medicine

## 2015-09-03 ENCOUNTER — Ambulatory Visit (INDEPENDENT_AMBULATORY_CARE_PROVIDER_SITE_OTHER): Payer: 59 | Admitting: Sports Medicine

## 2015-09-03 ENCOUNTER — Encounter: Payer: Self-pay | Admitting: Sports Medicine

## 2015-09-03 VITALS — BP 118/75 | HR 83 | Resp 18 | Wt 201.2 lb

## 2015-09-03 DIAGNOSIS — M67439 Ganglion, unspecified wrist: Secondary | ICD-10-CM | POA: Insufficient documentation

## 2015-09-03 DIAGNOSIS — M67432 Ganglion, left wrist: Secondary | ICD-10-CM

## 2015-09-03 NOTE — Assessment & Plan Note (Signed)
Aspiration and injection of left volar ganglion cyst taking care to avoid the radial artery. Strapped with compressive dressing.  Return in one month.

## 2015-09-03 NOTE — Progress Notes (Signed)
   Subjective:    I'm seeing this patient as a consultation for:  Dr. Okey Duprerawford  CC: Left wrist ganglion  HPI: This is a pleasant 50 year old female, for sometime now she's had a swelling over her volar left wrist, that is tender. Symptoms are mild, persistent. No radiation, no trauma, no constitutional symptoms.  Past medical history, Surgical history, Family history not pertinant except as noted below, Social history, Allergies, and medications have been entered into the medical record, reviewed, and no changes needed.   Review of Systems: No headache, visual changes, nausea, vomiting, diarrhea, constipation, dizziness, abdominal pain, skin rash, fevers, chills, night sweats, weight loss, swollen lymph nodes, body aches, joint swelling, muscle aches, chest pain, shortness of breath, mood changes, visual or auditory hallucinations.   Objective:   General: Well Developed, well nourished, and in no acute distress.  Neuro/Psych: Alert and oriented x3, extra-ocular muscles intact, able to move all 4 extremities, sensation grossly intact. Skin: Warm and dry, no rashes noted.  Respiratory: Not using accessory muscles, speaking in full sentences, trachea midline.  Cardiovascular: Pulses palpable, no extremity edema. Abdomen: Does not appear distended. Left Wrist: Visible volar ganglion cyst that is tender to palpation. ROM smooth and normal with good flexion and extension and ulnar/radial deviation that is symmetrical with opposite wrist. Palpation is normal over metacarpals, navicular, lunate, and TFCC; tendons without tenderness/ swelling No snuffbox tenderness. No tenderness over Canal of [place]. Strength 5/5 in all directions without pain. Negative Finkelstein, tinel's and phalens. Negative Watson's test.  Procedure: Real-time Ultrasound Guided aspiration/injection of left wrist volar ganglion Device: GE Logiq E  Verbal informed consent obtained.  Time-out conducted.  Noted no  overlying erythema, induration, or other signs of local infection.  Skin prepped in a sterile fashion.  Local anesthesia: Topical Ethyl chloride.  With sterile technique and under real time ultrasound guidance:  18-gauge needle advanced into the ganglion taking care to avoid the adjacent radial artery, a scant amount of thick fluid was aspirated, syringe switched and 1/2 mL lidocaine, 1/2 mL kenalog 40 injected easily. Completed without difficulty  Pain immediately resolved suggesting accurate placement of the medication.  Advised to call if fevers/chills, erythema, induration, drainage, or persistent bleeding.  Images permanently stored and available for review in the ultrasound unit.  Impression: Technically successful ultrasound guided injection.  The wrist was then strapped with compressive dressing.  Impression and Recommendations:   This case required medical decision making of moderate complexity.

## 2015-09-08 ENCOUNTER — Encounter: Payer: Self-pay | Admitting: Sports Medicine

## 2015-09-08 ENCOUNTER — Telehealth: Payer: Self-pay | Admitting: Sports Medicine

## 2015-09-08 ENCOUNTER — Ambulatory Visit (INDEPENDENT_AMBULATORY_CARE_PROVIDER_SITE_OTHER): Payer: Worker's Compensation | Admitting: Sports Medicine

## 2015-09-08 VITALS — BP 125/78 | HR 99 | Resp 18 | Wt 197.4 lb

## 2015-09-08 DIAGNOSIS — M1712 Unilateral primary osteoarthritis, left knee: Secondary | ICD-10-CM | POA: Diagnosis not present

## 2015-09-08 NOTE — Telephone Encounter (Signed)
-----   Message from [name], MD sent at 09/08/2015 11:33 AM EDT ----- Primary patellofemoral osteoarthritis, left knee, [employer] approval. Patricia Arroyo

## 2015-09-08 NOTE — Assessment & Plan Note (Signed)
Persistent patellofemoral pain, starting viscoosupplementation, we will get this approved.

## 2015-09-08 NOTE — Telephone Encounter (Signed)
Submitted for approval on [employer]. Awaiting confirmation.  

## 2015-09-08 NOTE — Progress Notes (Signed)
  Subjective:    CC: Left knee pain  HPI: This is a pleasant 50 year old female, she has left knee patellofemoral osteoarthritis and a history of a left prepatellar bursitis, the bursitis has improved however despite intra-articular injection she is continued to have pain under the kneecap, worse with kneeling, as well as going up and down stairs, moderate, persistent, wonders what the next step is.  Past medical history, Surgical history, Family history not pertinant except as noted below, Social history, Allergies, and medications have been entered into the medical record, reviewed, and no changes needed.   Review of Systems: No fevers, chills, night sweats, weight loss, chest pain, or shortness of breath.   Objective:    General: Well Developed, well nourished, and in no acute distress.  Neuro: Alert and oriented x3, extra-ocular muscles intact, sensation grossly intact.  HEENT: Normocephalic, atraumatic, pupils equal round reactive to light, neck supple, no masses, no lymphadenopathy, thyroid nonpalpable.  Skin: Warm and dry, no rashes. Cardiac: Regular rate and rhythm, no murmurs rubs or gallops, no lower extremity edema.  Respiratory: Clear to auscultation bilaterally. Not using accessory muscles, speaking in full sentences. Left Knee: Normal to inspection with no erythema or effusion or obvious bony abnormalities. Tender to palpation under the medial and lateral patellar facets, no tenderness over the prepatellar bursa. ROM normal in flexion and extension and lower leg rotation. Ligaments with solid consistent endpoints including ACL, PCL, LCL, MCL. Negative Mcmurray's and provocative meniscal tests. Non painful patellar compression. Patellar and quadriceps tendons unremarkable. Hamstring and quadriceps strength is normal.  Impression and Recommendations:    I spent 25 minutes with this patient, greater than 50% was face-to-face time counseling regarding the above diagnoses

## 2015-09-10 MED ORDER — HYALURONAN 30 MG/2ML IX SOSY: 1.0000 | PREFILLED_SYRINGE | INTRA_ARTICULAR | Status: DC

## 2015-09-10 NOTE — Telephone Encounter (Signed)
Received call back from Vernona RiegerLaura Art gallery manager(Claim Adjuster). Faxed order and notes to f:[phone].

## 2015-09-10 NOTE — Telephone Encounter (Signed)
Received the following information from [employer] benefits investigation:  Patient has [employer] POS self funded Plan with an Effective date of 07/04/2010. Medical notes must be submitted with the claim. Include medical necessity, diagnosis, and all clinical. Doctor may not buy and bill, medication must be obtained through the specialty pharmacy. Please contact the Orthovisc line if you would like medication transferred to specialty pharmacy. [id number] is covered at 80% of the contracted rate when performed in an office setting. Deductible must be met for coverage to apply. If out of pocket is met, coverage goes to 100% and copay will no longer apply. $45.00 copay for specialist office visit applies, if billed. Reference: [place] (spoke with May) to initiate prior authorization. Insurance company requires medical notes be faxed to [phone] AFTER injections are completed.   Called [facility] (spoke with [facility]) to initiate Rx fill and give information for delivery.  Transferred to specialty Rx department (P: [phone]), spoke with Evon. Jacklyn Shell is preferred agent. Will give information to ordering Provider for review.   Spoke with Pt, this is to be filled under workman's comp. Pt has an open claim with [employer]. Claim adjuster: Estella Husk, phone number: [phone]. Claim acct #: [id number]. Left adjuster a VM requesting approval for Orthovisc, waiting for return call.

## 2015-09-10 NOTE — Addendum Note (Signed)
Addended by: Collie SiadICHARDSON, Silena Wyss M on: 09/10/2015 02:01 PM   Modules accepted: Orders

## 2015-09-24 NOTE — Telephone Encounter (Signed)
Submitted office notes again to [facility].

## 2015-10-02 ENCOUNTER — Ambulatory Visit (INDEPENDENT_AMBULATORY_CARE_PROVIDER_SITE_OTHER): Payer: Worker's Compensation | Admitting: Sports Medicine

## 2015-10-02 DIAGNOSIS — M1712 Unilateral primary osteoarthritis, left knee: Secondary | ICD-10-CM | POA: Diagnosis not present

## 2015-10-02 NOTE — Assessment & Plan Note (Signed)
Orthovisc injection #1, return in one week for #2 of 4

## 2015-10-02 NOTE — Telephone Encounter (Signed)
Patient actually showed up here in the office, tells us that her Worker's Comp. nurse called her and told her that Orthovisc was approved, so per her report we will proceed with Orthovisc injections.

## 2015-10-02 NOTE — Telephone Encounter (Signed)
Received an adverse determination saying Con Memossupartz was preferred.  Where shall I send it?

## 2015-10-02 NOTE — Progress Notes (Signed)

## 2015-10-04 ENCOUNTER — Other Ambulatory Visit: Payer: Self-pay | Admitting: Sports Medicine

## 2015-10-08 NOTE — Telephone Encounter (Signed)
Did Pt say it OK to use office supply?

## 2015-10-09 ENCOUNTER — Ambulatory Visit (INDEPENDENT_AMBULATORY_CARE_PROVIDER_SITE_OTHER): Payer: 59 | Admitting: Sports Medicine

## 2015-10-09 ENCOUNTER — Encounter: Payer: Self-pay | Admitting: Sports Medicine

## 2015-10-09 VITALS — BP 124/76 | HR 83 | Resp 18 | Wt 199.1 lb

## 2015-10-09 DIAGNOSIS — M1712 Unilateral primary osteoarthritis, left knee: Secondary | ICD-10-CM | POA: Diagnosis not present

## 2015-10-09 NOTE — Progress Notes (Signed)

## 2015-10-09 NOTE — Assessment & Plan Note (Signed)
Orthovisc injection #2 of 4 into the left knee. Return in one week for #3. 

## 2015-10-14 ENCOUNTER — Encounter: Payer: Self-pay | Admitting: Sports Medicine

## 2015-10-14 ENCOUNTER — Ambulatory Visit (INDEPENDENT_AMBULATORY_CARE_PROVIDER_SITE_OTHER): Payer: Worker's Compensation | Admitting: Sports Medicine

## 2015-10-14 VITALS — BP 121/71 | HR 99 | Resp 18 | Wt 197.4 lb

## 2015-10-14 DIAGNOSIS — M1712 Unilateral primary osteoarthritis, left knee: Secondary | ICD-10-CM | POA: Diagnosis not present

## 2015-10-14 NOTE — Assessment & Plan Note (Signed)
Orthovisc injection #3 of 4 into the left knee, return in one week for #4

## 2015-10-14 NOTE — Progress Notes (Signed)
  Procedure: Real-time Ultrasound Guided Injection of left knee Device: GE Logiq E  Verbal informed consent obtained.  Time-out conducted.  Noted no overlying erythema, induration, or other signs of local infection.  Skin prepped in a sterile fashion.  Local anesthesia: Topical Ethyl chloride.  With sterile technique and under real time ultrasound guidance:   30 mg/2 mL of OrthoVisc (sodium hyaluronate) in a prefilled syringe was injected easily into the knee through a 22-gauge needle. Completed without difficulty  Pain immediately resolved suggesting accurate placement of the medication.  Advised to call if fevers/chills, erythema, induration, drainage, or persistent bleeding.  Images permanently stored and available for review in the ultrasound unit.  Impression: Technically successful ultrasound guided injection.  I spent 25 minutes with this patient, greater than 50% was face-to-face time counseling regarding the above diagnoses, she had issues that she wanted to discuss about her husband and her daughter. This was in addition and separate from the time spent performing the above procedure.

## 2015-10-21 ENCOUNTER — Ambulatory Visit: Payer: Self-pay | Admitting: Sports Medicine

## 2015-10-30 ENCOUNTER — Ambulatory Visit (INDEPENDENT_AMBULATORY_CARE_PROVIDER_SITE_OTHER): Payer: Worker's Compensation | Admitting: Sports Medicine

## 2015-10-30 ENCOUNTER — Encounter: Payer: Self-pay | Admitting: Sports Medicine

## 2015-10-30 DIAGNOSIS — M1712 Unilateral primary osteoarthritis, left knee: Secondary | ICD-10-CM

## 2015-10-30 NOTE — Assessment & Plan Note (Signed)
Orthovisc injection #4 of 4 to the left knee. Return as needed.

## 2015-10-30 NOTE — Progress Notes (Signed)
   Procedure: Real-time Ultrasound Guided Injection of left knee Device: GE Logiq E  Verbal informed consent obtained.  Time-out conducted.  Noted no overlying erythema, induration, or other signs of local infection.  Skin prepped in a sterile fashion.  Local anesthesia: Topical Ethyl chloride.  With sterile technique and under real time ultrasound guidance:   30 mg/2 mL of OrthoVisc (sodium hyaluronate) in a prefilled syringe was injected easily into the knee through a 22-gauge needle. Completed without difficulty  Pain immediately resolved suggesting accurate placement of the medication.  Advised to call if fevers/chills, erythema, induration, drainage, or persistent bleeding.  Images permanently stored and available for review in the ultrasound unit.  Impression: Technically successful ultrasound guided injection.  I spent 40 minutes with this patient, separate from the time spent performing the above procedure, greater than 50% was face-to-face time counseling regarding the above diagnoses and social issues at home regarding her daughters gender identity disorder.

## 2015-11-13 ENCOUNTER — Ambulatory Visit: Payer: Self-pay | Admitting: Sports Medicine

## 2015-11-16 ENCOUNTER — Ambulatory Visit (INDEPENDENT_AMBULATORY_CARE_PROVIDER_SITE_OTHER): Payer: Worker's Compensation | Admitting: Sports Medicine

## 2015-11-16 ENCOUNTER — Encounter: Payer: Self-pay | Admitting: Sports Medicine

## 2015-11-16 DIAGNOSIS — M7042 Prepatellar bursitis, left knee: Secondary | ICD-10-CM

## 2015-11-16 NOTE — Assessment & Plan Note (Signed)
Five-month response to previous bursa laceration, repeat bursa laceration and injection, referral to orthopedic surgery for consideration of bursal excision considering recurrence.

## 2015-11-16 NOTE — Progress Notes (Signed)
  Subjective:    CC: Left knee swelling  HPI: This is a pleasant 50 year old female with a history of recurrent left prepatellar bursitis. Previous aspiration was about 4-5 months ago. Now having recurrent, this will be her third aspiration. Symptoms are moderate, persistent with minimal pain. Osteoarthritis pain has resolved after Visco supplementation.  Past medical history, Surgical history, Family history not pertinant except as noted below, Social history, Allergies, and medications have been entered into the medical record, reviewed, and no changes needed.   Review of Systems: No fevers, chills, night sweats, weight loss, chest pain, or shortness of breath.   Objective:    General: Well Developed, well nourished, and in no acute distress.  Neuro: Alert and oriented x3, extra-ocular muscles intact, sensation grossly intact.  HEENT: Normocephalic, atraumatic, pupils equal round reactive to light, neck supple, no masses, no lymphadenopathy, thyroid nonpalpable.  Skin: Warm and dry, no rashes. Cardiac: Regular rate and rhythm, no murmurs rubs or gallops, no lower extremity edema.  Respiratory: Clear to auscultation bilaterally. Not using accessory muscles, speaking in full sentences. Left Knee: Visible swollen and distended prepatellar bursa without erythema ROM normal in flexion and extension and lower leg rotation. Ligaments with solid consistent endpoints including ACL, PCL, LCL, MCL. Negative Mcmurray's and provocative meniscal tests. Non painful patellar compression. Patellar and quadriceps tendons unremarkable. Hamstring and quadriceps strength is normal.  Procedure: Real-time Ultrasound Guided aspiration/Injection of left prepatellar bursitis Device: GE Logiq E  Verbal informed consent obtained.  Time-out conducted.  Noted no overlying erythema, induration, or other signs of local infection.  Skin prepped in a sterile fashion.  Local anesthesia: Topical Ethyl chloride.    With sterile technique and under real time ultrasound guidance:  Aspirated serosanguineous fluid, syringe switched and 1/2 mL kenalog 40 injected easily. Completed without difficulty  Pain immediately resolved suggesting accurate placement of the medication.  Advised to call if fevers/chills, erythema, induration, drainage, or persistent bleeding.  Images permanently stored and available for review in the ultrasound unit.  Impression: Technically successful ultrasound guided injection.  The knee was then strapped with compressive dressing.  Impression and Recommendations:    Prepatellar bursitis of left knee Five-month response to previous bursa laceration, repeat bursa laceration and injection, referral to orthopedic surgery for consideration of bursal excision considering recurrence.

## 2016-01-06 ENCOUNTER — Other Ambulatory Visit: Payer: Self-pay | Admitting: Internal Medicine

## 2016-01-28 ENCOUNTER — Other Ambulatory Visit: Payer: Self-pay | Admitting: *Deleted

## 2016-01-28 MED ORDER — ONETOUCH DELICA LANCETS 33G MISC: 2 refills | Status: DC

## 2016-03-18 ENCOUNTER — Other Ambulatory Visit: Payer: Self-pay | Admitting: Internal Medicine

## 2016-05-06 ENCOUNTER — Other Ambulatory Visit: Payer: Self-pay | Admitting: Internal Medicine

## 2016-05-12 ENCOUNTER — Encounter: Payer: Self-pay | Admitting: Internal Medicine

## 2016-05-19 ENCOUNTER — Ambulatory Visit (INDEPENDENT_AMBULATORY_CARE_PROVIDER_SITE_OTHER): Payer: 59 | Admitting: Internal Medicine

## 2016-05-19 ENCOUNTER — Encounter: Payer: Self-pay | Admitting: Internal Medicine

## 2016-05-19 DIAGNOSIS — I10 Essential (primary) hypertension: Secondary | ICD-10-CM | POA: Diagnosis not present

## 2016-05-19 DIAGNOSIS — Z0000 Encounter for general adult medical examination without abnormal findings: Secondary | ICD-10-CM | POA: Diagnosis not present

## 2016-05-19 DIAGNOSIS — E119 Type 2 diabetes mellitus without complications: Secondary | ICD-10-CM

## 2016-05-19 MED ORDER — OMEPRAZOLE 40 MG PO CPDR: 40.0000 mg | DELAYED_RELEASE_CAPSULE | Freq: Every day | ORAL | 3 refills | Status: DC

## 2016-05-19 MED ORDER — KETOCONAZOLE 2 % EX CREA
1.0000 | TOPICAL_CREAM | Freq: Every day | CUTANEOUS | 0 refills | Status: DC
Start: 2016-05-19 — End: 2019-12-13

## 2016-05-19 NOTE — Patient Instructions (Addendum)
We have sent in the cream for the rash. It is called ketoconazole cream to use twice a day.   Think about reading the book called Bright Line Eating by Demetra Shiner  Come back for the labs fasting one morning. The lab opens at 7:30 AM  Health Maintenance, Female Introduction Adopting a healthy lifestyle and getting preventive care can go a long way to promote health and wellness. Talk with your health care provider about what schedule of regular examinations is right for you. This is a good chance for you to check in with your provider about disease prevention and staying healthy. In between checkups, there are plenty of things you can do on your own. Experts have done a lot of research about which lifestyle changes and preventive measures are most likely to keep you healthy. Ask your health care provider for more information. Weight and diet Eat a healthy diet  Be sure to include plenty of vegetables, fruits, low-fat dairy products, and lean protein.  Do not eat a lot of foods high in solid fats, added sugars, or salt.  Get regular exercise. This is one of the most important things you can do for your health.  Most adults should exercise for at least 150 minutes each week. The exercise should increase your heart rate and make you sweat (moderate-intensity exercise).  Most adults should also do strengthening exercises at least twice a week. This is in addition to the moderate-intensity exercise. Maintain a healthy weight  Body mass index (BMI) is a measurement that can be used to identify possible weight problems. It estimates body fat based on height and weight. Your health care provider can help determine your BMI and help you achieve or maintain a healthy weight.  For females 68 years of age and older:  A BMI below 18.5 is considered underweight.  A BMI of 18.5 to 24.9 is normal.  A BMI of 25 to 29.9 is considered overweight.  A BMI of 30 and above is considered  obese. Watch levels of cholesterol and blood lipids  You should start having your blood tested for lipids and cholesterol at 53 years of age, then have this test every 5 years.  You may need to have your cholesterol levels checked more often if:  Your lipid or cholesterol levels are high.  You are older than 53 years of age.  You are at high risk for heart disease. Cancer screening Lung Cancer  Lung cancer screening is recommended for adults 66-13 years old who are at high risk for lung cancer because of a history of smoking.  A yearly low-dose CT scan of the lungs is recommended for people who:  Currently smoke.  Have quit within the past 15 years.  Have at least a 30-pack-year history of smoking. A pack year is smoking an average of one pack of cigarettes a day for 1 year.  Yearly screening should continue until it has been 15 years since you quit.  Yearly screening should stop if you develop a health problem that would prevent you from having lung cancer treatment. Breast Cancer  Practice breast self-awareness. This means understanding how your breasts normally appear and feel.  It also means doing regular breast self-exams. Let your health care provider know about any changes, no matter how small.  If you are in your 20s or 30s, you should have a clinical breast exam (CBE) by a health care provider every 1-3 years as part of a regular health exam.  If you are 40 or older, have a CBE every year. Also consider having a breast X-ray (mammogram) every year.  If you have a family history of breast cancer, talk to your health care provider about genetic screening.  If you are at high risk for breast cancer, talk to your health care provider about having an MRI and a mammogram every year.  Breast cancer gene (BRCA) assessment is recommended for women who have family members with BRCA-related cancers. BRCA-related cancers include:  Breast.  Ovarian.  Tubal.  Peritoneal  cancers.  Results of the assessment will determine the need for genetic counseling and BRCA1 and BRCA2 testing. Cervical Cancer  Your health care provider may recommend that you be screened regularly for cancer of the pelvic organs (ovaries, uterus, and vagina). This screening involves a pelvic examination, including checking for microscopic changes to the surface of your cervix (Pap test). You may be encouraged to have this screening done every 3 years, beginning at age 45.  For women ages 18-65, health care providers may recommend pelvic exams and Pap testing every 3 years, or they may recommend the Pap and pelvic exam, combined with testing for human papilloma virus (HPV), every 5 years. Some types of HPV increase your risk of cervical cancer. Testing for HPV may also be done on women of any age with unclear Pap test results.  Other health care providers may not recommend any screening for nonpregnant women who are considered low risk for pelvic cancer and who do not have symptoms. Ask your health care provider if a screening pelvic exam is right for you.  If you have had past treatment for cervical cancer or a condition that could lead to cancer, you need Pap tests and screening for cancer for at least 20 years after your treatment. If Pap tests have been discontinued, your risk factors (such as having a new sexual partner) need to be reassessed to determine if screening should resume. Some women have medical problems that increase the chance of getting cervical cancer. In these cases, your health care provider may recommend more frequent screening and Pap tests. Colorectal Cancer  This type of cancer can be detected and often prevented.  Routine colorectal cancer screening usually begins at 53 years of age and continues through 53 years of age.  Your health care provider may recommend screening at an earlier age if you have risk factors for colon cancer.  Your health care provider may also  recommend using home test kits to check for hidden blood in the stool.  A small camera at the end of a tube can be used to examine your colon directly (sigmoidoscopy or colonoscopy). This is done to check for the earliest forms of colorectal cancer.  Routine screening usually begins at age 34.  Direct examination of the colon should be repeated every 5-10 years through 53 years of age. However, you may need to be screened more often if early forms of precancerous polyps or small growths are found. Skin Cancer  Check your skin from head to toe regularly.  Tell your health care provider about any new moles or changes in moles, especially if there is a change in a mole's shape or color.  Also tell your health care provider if you have a mole that is larger than the size of a pencil eraser.  Always use sunscreen. Apply sunscreen liberally and repeatedly throughout the day.  Protect yourself by wearing long sleeves, pants, a wide-brimmed hat, and sunglasses whenever  you are outside. Heart disease, diabetes, and high blood pressure  High blood pressure causes heart disease and increases the risk of stroke. High blood pressure is more likely to develop in:  People who have blood pressure in the high end of the normal range (130-139/85-89 mm Hg).  People who are overweight or obese.  People who are African American.  If you are 47-59 years of age, have your blood pressure checked every 3-5 years. If you are 84 years of age or older, have your blood pressure checked every year. You should have your blood pressure measured twice-once when you are at a hospital or clinic, and once when you are not at a hospital or clinic. Record the average of the two measurements. To check your blood pressure when you are not at a hospital or clinic, you can use:  An automated blood pressure machine at a pharmacy.  A home blood pressure monitor.  If you are between 47 years and 5 years old, ask your health  care provider if you should take aspirin to prevent strokes.  Have regular diabetes screenings. This involves taking a blood sample to check your fasting blood sugar level.  If you are at a normal weight and have a low risk for diabetes, have this test once every three years after 53 years of age.  If you are overweight and have a high risk for diabetes, consider being tested at a younger age or more often. Preventing infection Hepatitis B  If you have a higher risk for hepatitis B, you should be screened for this virus. You are considered at high risk for hepatitis B if:  You were born in a country where hepatitis B is common. Ask your health care provider which countries are considered high risk.  Your parents were born in a high-risk country, and you have not been immunized against hepatitis B (hepatitis B vaccine).  You have HIV or AIDS.  You use needles to inject street drugs.  You live with someone who has hepatitis B.  You have had sex with someone who has hepatitis B.  You get hemodialysis treatment.  You take certain medicines for conditions, including cancer, organ transplantation, and autoimmune conditions. Hepatitis C  Blood testing is recommended for:  Everyone born from [birth year] through 1967.  Anyone with known risk factors for hepatitis C. Sexually transmitted infections (STIs)  You should be screened for sexually transmitted infections (STIs) including gonorrhea and chlamydia if:  You are sexually active and are younger than 53 years of age.  You are older than 53 years of age and your health care provider tells you that you are at risk for this type of infection.  Your sexual activity has changed since you were last screened and you are at an increased risk for chlamydia or gonorrhea. Ask your health care provider if you are at risk.  If you do not have HIV, but are at risk, it may be recommended that you take a prescription medicine daily to prevent HIV  infection. This is called pre-exposure prophylaxis (PrEP). You are considered at risk if:  You are sexually active and do not regularly use condoms or know the HIV status of your partner(s).  You take drugs by injection.  You are sexually active with a partner who has HIV. Talk with your health care provider about whether you are at high risk of being infected with HIV. If you choose to begin PrEP, you should first be tested for HIV.  You should then be tested every 3 months for as long as you are taking PrEP. Pregnancy  If you are premenopausal and you may become pregnant, ask your health care provider about preconception counseling.  If you may become pregnant, take 400 to 800 micrograms (mcg) of folic acid every day.  If you want to prevent pregnancy, talk to your health care provider about birth control (contraception). Osteoporosis and menopause  Osteoporosis is a disease in which the bones lose minerals and strength with aging. This can result in serious bone fractures. Your risk for osteoporosis can be identified using a bone density scan.  If you are 32 years of age or older, or if you are at risk for osteoporosis and fractures, ask your health care provider if you should be screened.  Ask your health care provider whether you should take a calcium or vitamin D supplement to lower your risk for osteoporosis.  Menopause may have certain physical symptoms and risks.  Hormone replacement therapy may reduce some of these symptoms and risks. Talk to your health care provider about whether hormone replacement therapy is right for you. Follow these instructions at home:  Schedule regular health, dental, and eye exams.  Stay current with your immunizations.  Do not use any tobacco products including cigarettes, chewing tobacco, or electronic cigarettes.  If you are pregnant, do not drink alcohol.  If you are breastfeeding, limit how much and how often you drink alcohol.  Limit  alcohol intake to no more than 1 drink per day for nonpregnant women. One drink equals 12 ounces of beer, 5 ounces of wine, or 1 ounces of hard liquor.  Do not use street drugs.  Do not share needles.  Ask your health care provider for help if you need support or information about quitting drugs.  Tell your health care provider if you often feel depressed.  Tell your health care provider if you have ever been abused or do not feel safe at home. This information is not intended to replace advice given to you by your health care provider. Make sure you discuss any questions you have with your health care provider. Document Released: 10/04/2010 Document Revised: 08/27/2015 Document Reviewed: 12/23/2014  2017 [employer]

## 2016-05-19 NOTE — Progress Notes (Signed)
   Subjective:    Patient ID: Cleatrice BurkeDonna L Vanderslice, female    DOB: 1966-01-12, 51 y.o.   MRN: [id number]  HPI The patient is a 51 YO female coming in for wellness. No new concerns, no follow up in some time.   [facility], social history reviewed and updated.   Review of Systems  Constitutional: Negative.   HENT: Negative.   Eyes: Negative.   Respiratory: Negative for cough, chest tightness and shortness of breath.   Cardiovascular: Negative for chest pain, palpitations and leg swelling.  Gastrointestinal: Negative for abdominal distention, abdominal pain, constipation, diarrhea, nausea and vomiting.  Musculoskeletal: Negative.   Skin: Negative.   Neurological: Negative.   Psychiatric/Behavioral: Negative.       Objective:   Physical Exam  Constitutional: She is oriented to person, place, and time. She appears well-developed and well-nourished.  HENT:  Head: Normocephalic and atraumatic.  Eyes: EOM are normal.  Neck: Normal range of motion.  Cardiovascular: Normal rate and regular rhythm.   Pulmonary/Chest: Effort normal and breath sounds normal. No respiratory distress. She has no wheezes. She has no rales.  Abdominal: Soft. Bowel sounds are normal. She exhibits no distension. There is no tenderness. There is no rebound.  Musculoskeletal: She exhibits no edema.  Neurological: She is alert and oriented to person, place, and time. Coordination normal.  Skin: Skin is warm and dry.  Psychiatric: She has a normal mood and affect.   Vitals:   05/19/16 1511  BP: 130/80  Pulse: 92  Temp: 98.2 F (36.8 C)  TempSrc: Oral  SpO2: 98%  Weight: 205 lb (93 kg)  Height: 5\' 2"  (1.575 m)      Assessment & Plan:

## 2016-05-19 NOTE — Progress Notes (Signed)
Pre visit review using our clinic review tool, if applicable. No additional management support is needed unless otherwise documented below in the visit note. 

## 2016-05-20 DIAGNOSIS — Z0000 Encounter for general adult medical examination without abnormal findings: Secondary | ICD-10-CM | POA: Insufficient documentation

## 2016-05-20 NOTE — Assessment & Plan Note (Signed)
Checking labs, colonoscopy up to date. Mammogram she states is up to date with gyn. Flu shot up to date, declines tetanus today. Pap smear is not indicated. Counseled about sun safety and mole surveillance. Given screening recommendations.

## 2016-05-20 NOTE — Assessment & Plan Note (Signed)
Checking HgA1c, not on meds currently. Working with diet and exercise. On ACE-I. Foot exam done, reminded about eye exam. Checking lipid panel as well. Not complicated.

## 2016-05-20 NOTE — Assessment & Plan Note (Signed)
BP at goal on her lisinopril and hctz. Checking CMP and adjust as needed.

## 2016-05-30 DIAGNOSIS — J40 Bronchitis, not specified as acute or chronic: Secondary | ICD-10-CM | POA: Diagnosis not present

## 2016-05-30 DIAGNOSIS — J329 Chronic sinusitis, unspecified: Secondary | ICD-10-CM | POA: Diagnosis not present

## 2016-06-01 ENCOUNTER — Other Ambulatory Visit: Payer: Self-pay | Admitting: Sports Medicine

## 2016-06-05 ENCOUNTER — Other Ambulatory Visit: Payer: Self-pay | Admitting: Family

## 2016-07-08 ENCOUNTER — Ambulatory Visit (INDEPENDENT_AMBULATORY_CARE_PROVIDER_SITE_OTHER): Payer: Worker's Compensation | Admitting: Sports Medicine

## 2016-07-08 DIAGNOSIS — M1712 Unilateral primary osteoarthritis, left knee: Secondary | ICD-10-CM | POA: Diagnosis not present

## 2016-07-08 NOTE — Assessment & Plan Note (Signed)
Did extremely well after Orthovisc series in June of last year, repeat steroid injection today, if recurrence of pain we can proceed again with Orthovisc.

## 2016-07-08 NOTE — Progress Notes (Signed)
  Subjective:    CC: Left knee pain  HPI: This is a pleasant 51 year old female with known left knee osteoarthritis, previous injections were 10 months ago, we did finish the series of Orthovisc, now having recurrence of pain, mild, persistent, localized the joint line without radiation. No mechanical symptoms, desires interventional treatment today.  Past medical history:  Negative.  See flowsheet/record as well for more information.  Surgical history: Negative.  See flowsheet/record as well for more information.  Family history: Negative.  See flowsheet/record as well for more information.  Social history: Negative.  See flowsheet/record as well for more information.  Allergies, and medications have been entered into the medical record, reviewed, and no changes needed.   Review of Systems: No fevers, chills, night sweats, weight loss, chest pain, or shortness of breath.   Objective:    General: Well Developed, well nourished, and in no acute distress.  Neuro: Alert and oriented x3, extra-ocular muscles intact, sensation grossly intact.  HEENT: Normocephalic, atraumatic, pupils equal round reactive to light, neck supple, no masses, no lymphadenopathy, thyroid nonpalpable.  Skin: Warm and dry, no rashes. Cardiac: Regular rate and rhythm, no murmurs rubs or gallops, no lower extremity edema.  Respiratory: Clear to auscultation bilaterally. Not using accessory muscles, speaking in full sentences. Left Knee: Only minimal swelling, no prepatellar bursitis, tenderness at the medial joint line and noted the medial and lateral patellar facets. ROM normal in flexion and extension and lower leg rotation. Ligaments with solid consistent endpoints including ACL, PCL, LCL, MCL. Negative Mcmurray's and provocative meniscal tests. Non painful patellar compression. Patellar and quadriceps tendons unremarkable. Hamstring and quadriceps strength is normal.  Procedure: Real-time Ultrasound Guided  Injection of left knee Device: GE Logiq E  Verbal informed consent obtained.  Time-out conducted.  Noted no overlying erythema, induration, or other signs of local infection.  Skin prepped in a sterile fashion.  Local anesthesia: Topical Ethyl chloride.  With sterile technique and under real time ultrasound guidance:  1 mL Kenalog 40, 2 mL lidocaine, 2 mL bupivacaine injected easily. Completed without difficulty  Pain immediately resolved suggesting accurate placement of the medication.  Advised to call if fevers/chills, erythema, induration, drainage, or persistent bleeding.  Images permanently stored and available for review in the ultrasound unit.  Impression: Technically successful ultrasound guided injection.  Impression and Recommendations:    Primary osteoarthritis of left knee Did extremely well after Orthovisc series in June of last year, repeat steroid injection today, if recurrence of pain we can proceed again with Orthovisc.

## 2016-07-31 ENCOUNTER — Other Ambulatory Visit: Payer: Self-pay | Admitting: Internal Medicine

## 2016-08-05 DIAGNOSIS — E119 Type 2 diabetes mellitus without complications: Secondary | ICD-10-CM | POA: Diagnosis not present

## 2016-08-24 DIAGNOSIS — R1031 Right lower quadrant pain: Secondary | ICD-10-CM | POA: Diagnosis not present

## 2016-08-24 DIAGNOSIS — K6389 Other specified diseases of intestine: Secondary | ICD-10-CM | POA: Diagnosis not present

## 2016-08-24 DIAGNOSIS — R102 Pelvic and perineal pain: Secondary | ICD-10-CM | POA: Diagnosis not present

## 2016-08-24 DIAGNOSIS — R1030 Lower abdominal pain, unspecified: Secondary | ICD-10-CM | POA: Diagnosis not present

## 2016-08-24 DIAGNOSIS — R10819 Abdominal tenderness, unspecified site: Secondary | ICD-10-CM | POA: Diagnosis not present

## 2016-08-24 DIAGNOSIS — K659 Peritonitis, unspecified: Secondary | ICD-10-CM | POA: Diagnosis not present

## 2016-12-16 ENCOUNTER — Other Ambulatory Visit (INDEPENDENT_AMBULATORY_CARE_PROVIDER_SITE_OTHER): Payer: 59

## 2016-12-16 DIAGNOSIS — Z0000 Encounter for general adult medical examination without abnormal findings: Secondary | ICD-10-CM | POA: Diagnosis not present

## 2016-12-16 DIAGNOSIS — E119 Type 2 diabetes mellitus without complications: Secondary | ICD-10-CM | POA: Diagnosis not present

## 2016-12-16 LAB — COMPREHENSIVE METABOLIC PANEL
ALT: 19 U/L (ref 0–35)
AST: 18 U/L (ref 0–37)
Albumin: 4.3 g/dL (ref 3.5–5.2)
Alkaline Phosphatase: 87 U/L (ref 39–117)
BUN: 18 mg/dL (ref 6–23)
CO2: 31 mEq/L (ref 19–32)
Calcium: 9.6 mg/dL (ref 8.4–10.5)
Chloride: 100 mEq/L (ref 96–112)
Creatinine, Ser: 0.81 mg/dL (ref 0.40–1.20)
GFR: 78.46 mL/min (ref >60.00)
Glucose, Bld: 92 mg/dL (ref 70–99)
Potassium: 3.6 mEq/L (ref 3.5–5.1)
Sodium: 139 mEq/L (ref 135–145)
Total Bilirubin: 0.4 mg/dL (ref 0.2–1.2)
Total Protein: 7.3 g/dL (ref 6.0–8.3)

## 2016-12-16 LAB — CBC
HCT: 41.7 % (ref 36.0–46.0)
Hemoglobin: 13.6 g/dL (ref 12.0–15.0)
MCHC: 32.6 g/dL (ref 30.0–36.0)
MCV: 88.5 fl (ref 78.0–100.0)
Platelets: 147 10*3/uL — ABNORMAL LOW (ref 150.0–400.0)
RBC: 4.71 Mil/uL (ref 3.87–5.11)
RDW: 15.3 % (ref 11.5–15.5)
WBC: 7.3 10*3/uL (ref 4.0–10.5)

## 2016-12-16 LAB — LIPID PANEL
Cholesterol: 199 mg/dL (ref 0–200)
HDL: 67.6 mg/dL (ref >39.00)
LDL Cholesterol: 110 mg/dL — ABNORMAL HIGH (ref 0–99)
NonHDL: 131.56
Total CHOL/HDL Ratio: 3
Triglycerides: 108 mg/dL (ref 0.0–149.0)
VLDL: 21.6 mg/dL (ref 0.0–40.0)

## 2016-12-16 LAB — HEMOGLOBIN A1C: Hgb A1c MFr Bld: 6 % (ref 4.6–6.5)

## 2016-12-22 ENCOUNTER — Other Ambulatory Visit: Payer: Self-pay | Admitting: Sports Medicine

## 2016-12-26 DIAGNOSIS — E119 Type 2 diabetes mellitus without complications: Secondary | ICD-10-CM | POA: Diagnosis not present

## 2017-02-05 ENCOUNTER — Other Ambulatory Visit: Payer: Self-pay | Admitting: Internal Medicine

## 2017-04-14 DIAGNOSIS — Z6833 Body mass index (BMI) 33.0-33.9, adult: Secondary | ICD-10-CM | POA: Diagnosis not present

## 2017-04-14 DIAGNOSIS — Z801 Family history of malignant neoplasm of trachea, bronchus and lung: Secondary | ICD-10-CM | POA: Diagnosis not present

## 2017-04-14 DIAGNOSIS — Z8049 Family history of malignant neoplasm of other genital organs: Secondary | ICD-10-CM | POA: Diagnosis not present

## 2017-04-14 DIAGNOSIS — Z8041 Family history of malignant neoplasm of ovary: Secondary | ICD-10-CM | POA: Diagnosis not present

## 2017-04-14 DIAGNOSIS — Z01419 Encounter for gynecological examination (general) (routine) without abnormal findings: Secondary | ICD-10-CM | POA: Diagnosis not present

## 2017-04-14 LAB — HM MAMMOGRAPHY

## 2017-05-05 ENCOUNTER — Other Ambulatory Visit: Payer: Self-pay | Admitting: Internal Medicine

## 2017-05-13 ENCOUNTER — Other Ambulatory Visit: Payer: Self-pay | Admitting: Internal Medicine

## 2017-06-07 DIAGNOSIS — Z809 Family history of malignant neoplasm, unspecified: Secondary | ICD-10-CM | POA: Diagnosis not present

## 2017-06-25 ENCOUNTER — Other Ambulatory Visit: Payer: Self-pay | Admitting: Sports Medicine

## 2017-07-06 DIAGNOSIS — E119 Type 2 diabetes mellitus without complications: Secondary | ICD-10-CM | POA: Diagnosis not present

## 2017-07-10 ENCOUNTER — Other Ambulatory Visit: Payer: Self-pay | Admitting: Internal Medicine

## 2017-07-30 ENCOUNTER — Other Ambulatory Visit: Payer: Self-pay | Admitting: Internal Medicine

## 2017-08-08 ENCOUNTER — Other Ambulatory Visit: Payer: Self-pay | Admitting: Internal Medicine

## 2017-08-17 ENCOUNTER — Ambulatory Visit: Payer: Self-pay | Admitting: Sports Medicine

## 2017-08-17 ENCOUNTER — Encounter: Payer: Self-pay | Admitting: Sports Medicine

## 2017-08-17 ENCOUNTER — Ambulatory Visit (INDEPENDENT_AMBULATORY_CARE_PROVIDER_SITE_OTHER): Payer: 59

## 2017-08-17 ENCOUNTER — Ambulatory Visit: Payer: 59 | Admitting: Sports Medicine

## 2017-08-17 DIAGNOSIS — M4802 Spinal stenosis, cervical region: Secondary | ICD-10-CM | POA: Diagnosis not present

## 2017-08-17 DIAGNOSIS — M5412 Radiculopathy, cervical region: Secondary | ICD-10-CM

## 2017-08-17 DIAGNOSIS — M79602 Pain in left arm: Secondary | ICD-10-CM | POA: Insufficient documentation

## 2017-08-17 DIAGNOSIS — M50121 Cervical disc disorder at C4-C5 level with radiculopathy: Secondary | ICD-10-CM | POA: Diagnosis not present

## 2017-08-17 DIAGNOSIS — M50321 Other cervical disc degeneration at C4-C5 level: Secondary | ICD-10-CM | POA: Diagnosis not present

## 2017-08-17 DIAGNOSIS — M67912 Unspecified disorder of synovium and tendon, left shoulder: Secondary | ICD-10-CM | POA: Insufficient documentation

## 2017-08-17 DIAGNOSIS — M50322 Other cervical disc degeneration at C5-C6 level: Secondary | ICD-10-CM | POA: Diagnosis not present

## 2017-08-17 MED ORDER — PREDNISONE 50 MG PO TABS: ORAL_TABLET | ORAL | 0 refills | Status: DC

## 2017-08-17 NOTE — Progress Notes (Signed)
Subjective:    I'm seeing this patient as a consultation for:  Hillard Danker, MD  CC: shoulder pain   HPI: Patricia Arroyo, a 52yo female with pmh significant for osteoarthritis, presents today with a cc of throbbing pain in her left shoulder over her trapezius. Pt reports that on Sunday she was removing weeds and was resting her weight on an outstretched left hand when at the end of the task she was unwilling/unable to carry the bucket of refuse with her left arm (weakness).  Pt endorses utilizing heat/biofreze to help alleviate symptoms as well as ingestion of nsaids, valium, and flexeril. Pt reports that she experiences little to no pain first thing in the morning. Pt reports that pain is worse with activity but approx a 1/10 without exacerbation. Lastly pt reports that pain radiates down her arm and into her fingers when exacerbated.   I reviewed the past medical history, family history, social history, surgical history, and allergies today and no changes were needed.  Please see the problem list section below in epic for further details.  Past Medical History: Past Medical History:  Diagnosis Date  . ADHD (attention deficit hyperactivity disorder)   . Anxiety   . Carpal tunnel syndrome   . Depression    h/o---no meds now  . Diabetes mellitus without complication (HCC)   . GERD (gastroesophageal reflux disease)   . Hypertension   . IBS (irritable bowel syndrome)   . Varicose veins    Past Surgical History: Past Surgical History:  Procedure Laterality Date  . ANTERIOR AND POSTERIOR REPAIR  03/10/2011   Procedure: ANTERIOR (CYSTOCELE) AND POSTERIOR REPAIR (RECTOCELE);  Surgeon: Turner Daniels, MD;  Location: [facility];  Service: Gynecology;  Laterality: N/A;  with Sacrospinous Ligament Suspension  . CARPAL TUNNEL RELEASE Bilateral 04/01/2013   Procedure: BILATERAL CARPAL TUNNEL RELEASE;  Surgeon: Tami Ribas, MD;  Location:  S[facility]ERY [facility];  Service: Orthopedics;   Laterality: Bilateral;  . COLONOSCOPY    . DILATION AND CURETTAGE OF UTERUS    . FOOT FASCIOTOMY  2005   both feet  . LAPAROSCOPIC ASSISTED VAGINAL HYSTERECTOMY  03/10/2011   Procedure: LAPAROSCOPIC ASSISTED VAGINAL HYSTERECTOMY;  Surgeon: Turner Daniels, MD;  Location: [facility];  Service: Gynecology;  Laterality: N/A;  . NOVASURE ABLATION    . SALPINGOOPHORECTOMY  03/10/2011   Procedure: SALPINGO OOPHERECTOMY;  Surgeon: Turner Daniels, MD;  Location: [facility];  Service: Gynecology;  Laterality: Bilateral;  . WISDOM TOOTH EXTRACTION     Social History: Social History   Socioeconomic History  . Marital status: Married    Spouse name: Not on file  . Number of children: Not on file  . Years of education: Not on file  . Highest education level: Not on file  Occupational History  . Not on file  Social Needs  . Financial resource strain: Not on file  . Food insecurity:    Worry: Not on file    Inability: Not on file  . Transportation needs:    Medical: Not on file    Non-medical: Not on file  Tobacco Use  . Smoking status: Never Smoker  . Smokeless tobacco: Never Used  Substance and Sexual Activity  . Alcohol use: No    Alcohol/week: 0.0 oz    Comment: occasionally  . Drug use: No  . Sexual activity: Not on file  Lifestyle  . Physical activity:    Days per week: Not on file    Minutes per  session: Not on file  . Stress: Not on file  Relationships  . Social connections:    Talks on phone: Not on file    Gets together: Not on file    Attends religious service: Not on file    Active member of club or organization: Not on file    Attends meetings of clubs or organizations: Not on file    Relationship status: Not on file  Other Topics Concern  . Not on file  Social History Narrative  . Not on file   Family History: Family History  Problem Relation Age of Onset  . Cancer Mother   . Diabetes Father   . Cancer Sister    Allergies: Allergies  Allergen Reactions  . Adhesive  [Tape] Hives and Other (See Comments)    Pt states that she is allergic to adhesive on Band-Aids.  . Banana     Burning sensation on tongue  . Diflucan [Fluconazole] Hives  . Penicillins Rash  . Sulfa Antibiotics Rash   Medications: See med rec.  Review of Systems: No headache, visual changes, nausea, vomiting, diarrhea, constipation, dizziness, abdominal pain, skin rash, fevers, chills, night sweats, weight loss, swollen lymph nodes, body aches, joint swelling, muscle aches, chest pain, shortness of breath, mood changes, visual or auditory hallucinations.   Objective:   General: Well Developed, well nourished, and in no acute distress.  Neuro:  Extra-ocular muscles intact, able to move all 4 extremities, sensation grossly intact.  Deep tendon reflexes tested were normal. Psych: Alert and oriented, mood congruent with affect. ENT:  Ears and nose appear unremarkable.  Hearing grossly normal. Neck: Unremarkable overall appearance, trachea midline.  No visible thyroid enlargement. Eyes: Conjunctivae and lids appear unremarkable.  Pupils equal and round. Skin: Warm and dry, no rashes noted.  Cardiovascular: Pulses palpable, no extremity edema.  Neck: Inspection unremarkable. No palpable stepoffs. Negative Spurling's maneuver. Full neck range of motion No sensory change to C4 to T1 Tender to palpation at C6   Right Shoulder: Inspection reveals no abnormalities, atrophy or asymmetry. Palpation is normal with no tenderness over AC joint or bicipital groove. ROM is full in all planes (passive flexion, extension, and abduction and adduction in the horizontal plane). Rotator cuff strength normal throughout. No signs of impingement with negative Neer and Hawkin's tests; pain over trapezius with  empty can sign. Speeds and Yergason's tests normal. No labral pathology noted with negative Obrien's, negative clunk and good stability. Normal scapular function observed. No painful arc and no  drop arm sign. No apprehension sign Pain over trapezius with internal rotation    Impression and Recommendations:   This case required medical decision making of moderate complexity.  Assessment-Cervical Radiculitis (C6? C7? C8?) Short course of prednisone to diminish the inflammatory response that is exacerbating her cervical spine pain.  Imaging  (Xray) is indicated to see extent of damage.  Pt was given informative handout with rehabilitative exercises.  Pt was advised to complete rehabilitative exercises every night for long term relief of pain and maintenance of range of motion and improvement of strength.  Pt was reassured that she can continue heating and/or utilizing biofreeze approx 3-4 times a day for approx 20 minutes each time.  Pt advised to follow up in 1 month for evaluation of symptoms.  Pt counseled about potential next steps eg imaging (MRI) for planning of epidural steroid injection  In cervical spine.  ___________________________________________ [name]. Benjamin Stain, M.D., ABFM., CAQSM. Primary Care and Sports Medicine [facility]  [facility]  Adjunct Instructor of Family Medicine  [employer]

## 2017-08-17 NOTE — Assessment & Plan Note (Signed)
Starting conservative, prednisone, x-rays, home rehab exercises, return in a month, MR for interventional planning if no better.

## 2017-08-25 ENCOUNTER — Telehealth: Payer: Self-pay

## 2017-08-25 MED ORDER — PREDNISONE 10 MG (48) PO TBPK: ORAL_TABLET | Freq: Every day | ORAL | 0 refills | Status: DC

## 2017-08-25 NOTE — Telephone Encounter (Signed)
Pt advised. No further needs at this time 

## 2017-08-25 NOTE — Telephone Encounter (Signed)
Adding a taper course

## 2017-08-25 NOTE — Telephone Encounter (Signed)
Pt called stating that she was given Prednisone and some exercises to do at her last OV.  Pt reports she is doing exercises as directed but as soon as she finished her prednisone two days ago, the pain came back and she cannot work like this.   Pt scheduled a follow up apt with Dr T on Tuesday 08-29-17, but wants to know if she can have an RX for prednisone to last her until her appt, so that she can work this weekend.  Please advise.

## 2017-08-29 ENCOUNTER — Ambulatory Visit: Payer: 59 | Admitting: Sports Medicine

## 2017-08-29 DIAGNOSIS — M5412 Radiculopathy, cervical region: Secondary | ICD-10-CM | POA: Diagnosis not present

## 2017-08-29 MED ORDER — ETODOLAC ER 500 MG PO TB24: 500.0000 mg | ORAL_TABLET | Freq: Every day | ORAL | 3 refills | Status: DC

## 2017-08-29 NOTE — Progress Notes (Signed)
Subjective:    CC: Follow-up  HPI: Left cervical radiculitis: For the most part resolved after a burst of prednisone and then a taper.  She has been moderately diligent with her rehab exercises, right now she just needs a work note.  I reviewed the past medical history, family history, social history, surgical history, and allergies today and no changes were needed.  Please see the problem list section below in epic for further details.  Past Medical History: Past Medical History:  Diagnosis Date  . ADHD (attention deficit hyperactivity disorder)   . Anxiety   . Carpal tunnel syndrome   . Depression    h/o---no meds now  . Diabetes mellitus without complication (HCC)   . GERD (gastroesophageal reflux disease)   . Hypertension   . IBS (irritable bowel syndrome)   . Varicose veins    Past Surgical History: Past Surgical History:  Procedure Laterality Date  . ANTERIOR AND POSTERIOR REPAIR  03/10/2011   Procedure: ANTERIOR (CYSTOCELE) AND POSTERIOR REPAIR (RECTOCELE);  Surgeon: Turner Daniels, MD;  Location: [facility];  Service: Gynecology;  Laterality: N/A;  with Sacrospinous Ligament Suspension  . CARPAL TUNNEL RELEASE Bilateral 04/01/2013   Procedure: BILATERAL CARPAL TUNNEL RELEASE;  Surgeon: Tami Ribas, MD;  Location: [facility];  Service: Orthopedics;  Laterality: Bilateral;  . COLONOSCOPY    . DILATION AND CURETTAGE OF UTERUS    . FOOT FASCIOTOMY  2005   both feet  . LAPAROSCOPIC ASSISTED VAGINAL HYSTERECTOMY  03/10/2011   Procedure: LAPAROSCOPIC ASSISTED VAGINAL HYSTERECTOMY;  Surgeon: Turner Daniels, MD;  Location: [facility];  Service: Gynecology;  Laterality: N/A;  . NOVASURE ABLATION    . SALPINGOOPHORECTOMY  03/10/2011   Procedure: SALPINGO OOPHERECTOMY;  Surgeon: Turner Daniels, MD;  Location: [facility];  Service: Gynecology;  Laterality: Bilateral;  . WISDOM TOOTH EXTRACTION     Social History: Social History   Socioeconomic History  . Marital status: Married      Spouse name: Not on file  . Number of children: Not on file  . Years of education: Not on file  . Highest education level: Not on file  Occupational History  . Not on file  Social Needs  . Financial resource strain: Not on file  . Food insecurity:    Worry: Not on file    Inability: Not on file  . Transportation needs:    Medical: Not on file    Non-medical: Not on file  Tobacco Use  . Smoking status: Never Smoker  . Smokeless tobacco: Never Used  Substance and Sexual Activity  . Alcohol use: No    Alcohol/week: 0.0 oz    Comment: occasionally  . Drug use: No  . Sexual activity: Not on file  Lifestyle  . Physical activity:    Days per week: Not on file    Minutes per session: Not on file  . Stress: Not on file  Relationships  . Social connections:    Talks on phone: Not on file    Gets together: Not on file    Attends religious service: Not on file    Active member of club or organization: Not on file    Attends meetings of clubs or organizations: Not on file    Relationship status: Not on file  Other Topics Concern  . Not on file  Social History Narrative  . Not on file   Family History: Family History  Problem Relation Age of Onset  . Cancer  Mother   . Diabetes Father   . Cancer Sister    Allergies: Allergies  Allergen Reactions  . Adhesive [Tape] Hives and Other (See Comments)    Pt states that she is allergic to adhesive on Band-Aids.  . Banana     Burning sensation on tongue  . Diflucan [Fluconazole] Hives  . Penicillins Rash  . Sulfa Antibiotics Rash   Medications: See med rec.  Review of Systems: No fevers, chills, night sweats, weight loss, chest pain, or shortness of breath.   Objective:    General: Well Developed, well nourished, and in no acute distress.  Neuro: Alert and oriented x3, extra-ocular muscles intact, sensation grossly intact.  HEENT: Normocephalic, atraumatic, pupils equal round reactive to light, neck supple, no masses,  no lymphadenopathy, thyroid nonpalpable.  Skin: Warm and dry, no rashes. Cardiac: Regular rate and rhythm, no murmurs rubs or gallops, no lower extremity edema.  Respiratory: Clear to auscultation bilaterally. Not using accessory muscles, speaking in full sentences.  Impression and Recommendations:    Radiculitis of left cervical region Good improvements with a burst of prednisone and then a taper, continue rehab exercises for now. I do think we need to give this at least another couple of weeks. MR for interventional planning if no better at the 2-week point, work note written today. Adding etodolac.  I spent 40 minutes with this patient, greater than 50% was face-to-face time counseling regarding the above diagnoses, this was spent discussing prognosis, imaging results.  Answering multiple questions. ___________________________________________ [name]. Benjamin Stain, M.D., ABFM., CAQSM. Primary Care and Sports Medicine [facility]  Adjunct Instructor of Family Medicine  [employer]

## 2017-08-29 NOTE — Assessment & Plan Note (Addendum)
Good improvements with a burst of prednisone and then a taper, continue rehab exercises for now. I do think we need to give this at least another couple of weeks. MR for interventional planning if no better at the 2-week point, work note written today. Adding etodolac.

## 2017-09-12 ENCOUNTER — Ambulatory Visit: Payer: Self-pay | Admitting: Sports Medicine

## 2017-09-15 ENCOUNTER — Telehealth: Payer: Self-pay | Admitting: Internal Medicine

## 2017-09-15 MED ORDER — TRIAMTERENE-HCTZ 37.5-25 MG PO TABS: 1.0000 | ORAL_TABLET | Freq: Every day | ORAL | 0 refills | Status: DC

## 2017-09-15 NOTE — Addendum Note (Signed)
Addended by: Berton LanGULCH, BRIANA R on: 09/15/2017 01:24 PM   Modules accepted: Orders

## 2017-09-15 NOTE — Telephone Encounter (Unsigned)
Copied from CRM [id number]. Topic: Quick Communication - Rx Refill/Question >> Sep 15, 2017 11:12 AM Raquel SarnaHayes, Teresa G wrote: triamterene-hydrochlorothiazide [facility](MAXZIDE-25) 37.5-25 MG tablet  Has July 3rd appt for medication refill / physical.  Needing emergency refill to:  [facility]  [address], [place]  [phone]

## 2017-09-15 NOTE — Telephone Encounter (Signed)
See 2nd telephone encounter  

## 2017-09-15 NOTE — Telephone Encounter (Signed)
Medication sent to pharmacy  

## 2017-09-15 NOTE — Telephone Encounter (Signed)
Patient calling. States pharmacy informed her that she needed to schedule an annual visit to get refills on her triamterene-hydrochlorothiazide (MAXZIDE-25) 37.5-25 MG tablet. Patient is now schedule for 10/04/17 for her CPE. States that she is out of town until 10/03/17 for vacation. Would like to know if medication could be sent to the pharmacy to last her until her appointment? Please advise. CB#: [phone]

## 2017-10-04 ENCOUNTER — Ambulatory Visit (INDEPENDENT_AMBULATORY_CARE_PROVIDER_SITE_OTHER): Payer: 59 | Admitting: Internal Medicine

## 2017-10-04 ENCOUNTER — Encounter: Payer: Self-pay | Admitting: Internal Medicine

## 2017-10-04 ENCOUNTER — Other Ambulatory Visit (INDEPENDENT_AMBULATORY_CARE_PROVIDER_SITE_OTHER): Payer: 59

## 2017-10-04 VITALS — BP 110/86 | HR 76 | Temp 97.7°F | Ht 62.0 in | Wt 178.0 lb

## 2017-10-04 DIAGNOSIS — Z23 Encounter for immunization: Secondary | ICD-10-CM

## 2017-10-04 DIAGNOSIS — K580 Irritable bowel syndrome with diarrhea: Secondary | ICD-10-CM

## 2017-10-04 DIAGNOSIS — E119 Type 2 diabetes mellitus without complications: Secondary | ICD-10-CM | POA: Diagnosis not present

## 2017-10-04 DIAGNOSIS — Z0000 Encounter for general adult medical examination without abnormal findings: Secondary | ICD-10-CM

## 2017-10-04 DIAGNOSIS — K219 Gastro-esophageal reflux disease without esophagitis: Secondary | ICD-10-CM

## 2017-10-04 DIAGNOSIS — I10 Essential (primary) hypertension: Secondary | ICD-10-CM | POA: Diagnosis not present

## 2017-10-04 LAB — CBC
HCT: 44.3 % (ref 36.0–46.0)
Hemoglobin: 15.2 g/dL — ABNORMAL HIGH (ref 12.0–15.0)
MCHC: 34.3 g/dL (ref 30.0–36.0)
MCV: 90.4 fl (ref 78.0–100.0)
Platelets: 200 10*3/uL (ref 150.0–400.0)
RBC: 4.9 Mil/uL (ref 3.87–5.11)
RDW: 14.4 % (ref 11.5–15.5)
WBC: 6.8 10*3/uL (ref 4.0–10.5)

## 2017-10-04 LAB — HEMOGLOBIN A1C: Hgb A1c MFr Bld: 6.1 % (ref 4.6–6.5)

## 2017-10-04 LAB — COMPREHENSIVE METABOLIC PANEL
ALT: 21 U/L (ref 0–35)
AST: 21 U/L (ref 0–37)
Albumin: 4.4 g/dL (ref 3.5–5.2)
Alkaline Phosphatase: 75 U/L (ref 39–117)
BUN: 15 mg/dL (ref 6–23)
CO2: 31 mEq/L (ref 19–32)
Calcium: 9.5 mg/dL (ref 8.4–10.5)
Chloride: 98 mEq/L (ref 96–112)
Creatinine, Ser: 0.78 mg/dL (ref 0.40–1.20)
GFR: 81.71 mL/min (ref >60.00)
Glucose, Bld: 103 mg/dL — ABNORMAL HIGH (ref 70–99)
Potassium: 3.5 mEq/L (ref 3.5–5.1)
Sodium: 139 mEq/L (ref 135–145)
Total Bilirubin: 0.7 mg/dL (ref 0.2–1.2)
Total Protein: 7.2 g/dL (ref 6.0–8.3)

## 2017-10-04 LAB — LIPID PANEL
Cholesterol: 239 mg/dL — ABNORMAL HIGH (ref 0–200)
HDL: 62.7 mg/dL (ref >39.00)
LDL Cholesterol: 150 mg/dL — ABNORMAL HIGH (ref 0–99)
NonHDL: 175.98
Total CHOL/HDL Ratio: 4
Triglycerides: 130 mg/dL (ref 0.0–149.0)
VLDL: 26 mg/dL (ref 0.0–40.0)

## 2017-10-04 LAB — MICROALBUMIN / CREATININE URINE RATIO
Creatinine,U: 157 mg/dL
Microalb Creat Ratio: 0.5 mg/g (ref 0.0–30.0)
Microalb, Ur: 0.7 mg/dL (ref 0.0–1.9)

## 2017-10-04 NOTE — Patient Instructions (Signed)

## 2017-10-04 NOTE — Progress Notes (Signed)
   Subjective:    Patient ID: Cleatrice BurkeDonna L Savastano, female    DOB: 10/25/1965, 52 y.o.   MRN: [id number]  HPI The patient is a 52 YO female coming in for physical.   [facility], [facility], social history reviewed and updated.   Review of Systems  Constitutional: Negative.   HENT: Negative.   Eyes: Negative.   Respiratory: Negative for cough, chest tightness and shortness of breath.   Cardiovascular: Negative for chest pain, palpitations and leg swelling.  Gastrointestinal: Negative for abdominal distention, abdominal pain, constipation, diarrhea, nausea and vomiting.  Musculoskeletal: Positive for arthralgias and myalgias.  Skin: Negative.   Neurological: Negative.   Psychiatric/Behavioral: Negative.       Objective:   Physical Exam  Constitutional: She is oriented to person, place, and time. She appears well-developed and well-nourished.  HENT:  Head: Normocephalic and atraumatic.  Eyes: EOM are normal.  Neck: Normal range of motion.  Cardiovascular: Normal rate and regular rhythm.  Pulmonary/Chest: Effort normal and breath sounds normal. No respiratory distress. She has no wheezes. She has no rales.  Abdominal: Soft. Bowel sounds are normal. She exhibits no distension. There is no tenderness. There is no rebound.  Musculoskeletal: She exhibits no edema.  Neurological: She is alert and oriented to person, place, and time. Coordination normal.  Skin: Skin is warm and dry.  Psychiatric: She has a normal mood and affect.   Vitals:   10/04/17 0923  BP: 110/86  Pulse: 76  Temp: 97.7 F (36.5 C)  TempSrc: Oral  SpO2: 96%  Weight: 178 lb (80.7 kg)  Height: 5\' 2"  (1.575 m)      Assessment & Plan:  Tdap given at visit

## 2017-10-06 NOTE — Assessment & Plan Note (Signed)
Uses lomotil when needed.

## 2017-10-06 NOTE — Assessment & Plan Note (Signed)
Needs mammogram and will get done. Needs eye exam. Tetanus and pneumonia up to date. Flu shot yearly. Counseled about sun safety and mole surveillance. Counseled about dangers of distracted driving. Given 10 year screening recommendations.

## 2017-10-06 NOTE — Assessment & Plan Note (Signed)
Currently diet controlled, checking HgA1c. Foot exam done. Microalbumin ordered. Needs eye exam. Not complicated.

## 2017-10-06 NOTE — Assessment & Plan Note (Signed)
Taking prilosec daily, controlling symptoms well.

## 2017-10-06 NOTE — Assessment & Plan Note (Signed)
BP at goal on hctz and lisinopril. Checking CMP and adjust as needed.  

## 2017-10-07 ENCOUNTER — Other Ambulatory Visit: Payer: Self-pay | Admitting: Internal Medicine

## 2017-10-13 ENCOUNTER — Encounter: Payer: Self-pay | Admitting: Internal Medicine

## 2017-10-13 NOTE — Progress Notes (Signed)
Abstracted and sent to scan  

## 2017-10-18 ENCOUNTER — Ambulatory Visit: Payer: 59 | Admitting: Sports Medicine

## 2017-10-18 ENCOUNTER — Encounter: Payer: Self-pay | Admitting: Sports Medicine

## 2017-10-18 DIAGNOSIS — M5412 Radiculopathy, cervical region: Secondary | ICD-10-CM

## 2017-10-18 NOTE — Progress Notes (Signed)
Subjective:    CC: Follow-up  HPI: Cervical radicular pain is improved considerably with conservative measures, still has a bit of left-sided trapezial and periscapular discomfort.  I reviewed the past medical history, family history, social history, surgical history, and allergies today and no changes were needed.  Please see the problem list section below in epic for further details.  Past Medical History: Past Medical History:  Diagnosis Date  . ADHD (attention deficit hyperactivity disorder)   . Anxiety   . Carpal tunnel syndrome   . Depression    h/o---no meds now  . Diabetes mellitus without complication (HCC)   . GERD (gastroesophageal reflux disease)   . Hypertension   . IBS (irritable bowel syndrome)   . Varicose veins    Past Surgical History: Past Surgical History:  Procedure Laterality Date  . ANTERIOR AND POSTERIOR REPAIR  03/10/2011   Procedure: ANTERIOR (CYSTOCELE) AND POSTERIOR REPAIR (RECTOCELE);  Surgeon: Turner Daniels, MD;  Location: [facility];  Service: Gynecology;  Laterality: N/A;  with Sacrospinous Ligament Suspension  . CARPAL TUNNEL RELEASE Bilateral 04/01/2013   Procedure: BILATERAL CARPAL TUNNEL RELEASE;  Surgeon: Tami Ribas, MD;  Location: [facility];  Service: Orthopedics;  Laterality: Bilateral;  . COLONOSCOPY    . DILATION AND CURETTAGE OF UTERUS    . FOOT FASCIOTOMY  2005   both feet  . LAPAROSCOPIC ASSISTED VAGINAL HYSTERECTOMY  03/10/2011   Procedure: LAPAROSCOPIC ASSISTED VAGINAL HYSTERECTOMY;  Surgeon: Turner Daniels, MD;  Location: [facility];  Service: Gynecology;  Laterality: N/A;  . NOVASURE ABLATION    . SALPINGOOPHORECTOMY  03/10/2011   Procedure: SALPINGO OOPHERECTOMY;  Surgeon: Turner Daniels, MD;  Location: [facility];  Service: Gynecology;  Laterality: Bilateral;  . WISDOM TOOTH EXTRACTION     Social History: Social History   Socioeconomic History  . Marital status: Married    Spouse name: Not on file  . Number of children:  Not on file  . Years of education: Not on file  . Highest education level: Not on file  Occupational History  . Not on file  Social Needs  . Financial resource strain: Not on file  . Food insecurity:    Worry: Not on file    Inability: Not on file  . Transportation needs:    Medical: Not on file    Non-medical: Not on file  Tobacco Use  . Smoking status: Never Smoker  . Smokeless tobacco: Never Used  Substance and Sexual Activity  . Alcohol use: No    Alcohol/week: 0.0 oz    Comment: occasionally  . Drug use: No  . Sexual activity: Not on file  Lifestyle  . Physical activity:    Days per week: Not on file    Minutes per session: Not on file  . Stress: Not on file  Relationships  . Social connections:    Talks on phone: Not on file    Gets together: Not on file    Attends religious service: Not on file    Active member of club or organization: Not on file    Attends meetings of clubs or organizations: Not on file    Relationship status: Not on file  Other Topics Concern  . Not on file  Social History Narrative  . Not on file   Family History: Family History  Problem Relation Age of Onset  . Cancer Mother   . Diabetes Father   . Cancer Sister    Allergies: Allergies  Allergen Reactions  . Adhesive [Tape] Hives and Other (See Comments)    Pt states that she is allergic to adhesive on Band-Aids.  . Banana     Burning sensation on tongue  . Diflucan [Fluconazole] Hives  . Penicillins Rash  . Sulfa Antibiotics Rash   Medications: See med rec.  Review of Systems: No fevers, chills, night sweats, weight loss, chest pain, or shortness of breath.   Objective:    General: Well Developed, well nourished, and in no acute distress.  Neuro: Alert and oriented x3, extra-ocular muscles intact, sensation grossly intact.  HEENT: Normocephalic, atraumatic, pupils equal round reactive to light, neck supple, no masses, no lymphadenopathy, thyroid nonpalpable.  Skin: Warm  and dry, no rashes. Cardiac: Regular rate and rhythm, no murmurs rubs or gallops, no lower extremity edema.  Respiratory: Clear to auscultation bilaterally. Not using accessory muscles, speaking in full sentences. Neck: Negative spurling's Full neck range of motion Grip strength and sensation normal in bilateral hands Strength good C4 to T1 distribution No sensory change to C4 to T1 Reflexes normal There is an area of tenderness to palpation on the trapezius just at the medial border of the scapula  Impression and Recommendations:    Radiculitis of left cervical region Continues to improve, still has a bit of left-sided periscapular muscle spasm, offered trigger point injection today, we can reserve this for a later date, and I would like her to just use her TENS unit a couple times a day in this location. New work note written.  I spent 25 minutes with this patient, greater than 50% was face-to-face time counseling regarding the above diagnoses ___________________________________________ Ihor Austinhomas J. Benjamin Stainhekkekandam, M.D., ABFM., CAQSM. Primary Care and Sports Medicine [facility]  Adjunct Instructor of Family Medicine  [employer]

## 2017-10-18 NOTE — Assessment & Plan Note (Signed)
Continues to improve, still has a bit of left-sided periscapular muscle spasm, offered trigger point injection today, we can reserve this for a later date, and I would like her to just use her TENS unit a couple times a day in this location. New work note written.

## 2017-10-20 DIAGNOSIS — E119 Type 2 diabetes mellitus without complications: Secondary | ICD-10-CM | POA: Diagnosis not present

## 2017-10-28 ENCOUNTER — Other Ambulatory Visit: Payer: Self-pay | Admitting: Internal Medicine

## 2017-11-09 ENCOUNTER — Ambulatory Visit: Payer: 59 | Admitting: Psychology

## 2017-11-16 ENCOUNTER — Ambulatory Visit (INDEPENDENT_AMBULATORY_CARE_PROVIDER_SITE_OTHER): Payer: 59 | Admitting: Psychology

## 2017-11-16 DIAGNOSIS — F900 Attention-deficit hyperactivity disorder, predominantly inattentive type: Secondary | ICD-10-CM

## 2017-11-16 DIAGNOSIS — Z634 Disappearance and death of family member: Secondary | ICD-10-CM | POA: Diagnosis not present

## 2017-11-16 DIAGNOSIS — F411 Generalized anxiety disorder: Secondary | ICD-10-CM | POA: Diagnosis not present

## 2017-11-17 ENCOUNTER — Ambulatory Visit: Payer: Self-pay | Admitting: Sports Medicine

## 2017-12-19 ENCOUNTER — Telehealth: Payer: Self-pay

## 2017-12-19 MED ORDER — PREDNISONE 50 MG PO TABS: ORAL_TABLET | ORAL | 0 refills | Status: DC

## 2017-12-19 NOTE — Telephone Encounter (Signed)
Patricia Arroyo called and states she has an acute appointment on Thursday. She has neck pain and would like prednisone 50 mg sent in. Please advise.

## 2017-12-19 NOTE — Telephone Encounter (Signed)
Done

## 2017-12-20 ENCOUNTER — Other Ambulatory Visit: Payer: Self-pay | Admitting: Internal Medicine

## 2017-12-20 NOTE — Telephone Encounter (Signed)
Left message advising of new prescription.  

## 2017-12-21 ENCOUNTER — Telehealth: Payer: Self-pay

## 2017-12-21 ENCOUNTER — Encounter: Payer: Self-pay | Admitting: Sports Medicine

## 2017-12-21 ENCOUNTER — Ambulatory Visit: Payer: 59 | Admitting: Sports Medicine

## 2017-12-21 DIAGNOSIS — M5412 Radiculopathy, cervical region: Secondary | ICD-10-CM | POA: Diagnosis not present

## 2017-12-21 MED ORDER — DIPHENOXYLATE-ATROPINE 2.5-0.025 MG PO TABS: 1.0000 | ORAL_TABLET | Freq: Four times a day (QID) | ORAL | 0 refills | Status: DC | PRN

## 2017-12-21 NOTE — Telephone Encounter (Signed)
There is not a lomotil 10 mg. We have sent in refill of lomotil.

## 2017-12-21 NOTE — Assessment & Plan Note (Signed)
Continues to improve with conservative measures, #3 trigger point injections given today. Continue to use TENS unit. Because ultimately I think she is going to need epidural we are going to proceed with a cervical spine MRI.

## 2017-12-21 NOTE — Telephone Encounter (Signed)
Patient is requesting Lomotil 10mg  refilled 90days no refills states that lasts her a year but she does get really bad diarrhea and this helps the best. We have the 2.5mg  in the chart.

## 2017-12-21 NOTE — Addendum Note (Signed)
Addended by: Hillard DankerRAWFORD, Trueman Worlds A on: 12/21/2017 10:29 AM   Modules accepted: Orders

## 2017-12-21 NOTE — Progress Notes (Signed)
Subjective:    CC: Shoulder and neck pain  HPI: This is a pleasant 52 year old female, she has had left neck and shoulder pain for some time now, x-ray showed multilevel cervical degenerative disc disease.  She improved considerably with rehabilitation exercises, stretches.  Prednisone has also been very effective.  Still has a bit of pain that she localizes medial to her left scapula.  Nothing overtly radicular down to the hand or fingertips.  Moderate, persistent.  I reviewed the past medical history, family history, social history, surgical history, and allergies today and no changes were needed.  Please see the problem list section below in epic for further details.  Past Medical History: Past Medical History:  Diagnosis Date  . ADHD (attention deficit hyperactivity disorder)   . Anxiety   . Carpal tunnel syndrome   . Depression    h/o---no meds now  . Diabetes mellitus without complication (HCC)   . GERD (gastroesophageal reflux disease)   . Hypertension   . IBS (irritable bowel syndrome)   . Varicose veins    Past Surgical History: Past Surgical History:  Procedure Laterality Date  . ANTERIOR AND POSTERIOR REPAIR  03/10/2011   Procedure: ANTERIOR (CYSTOCELE) AND POSTERIOR REPAIR (RECTOCELE);  Surgeon: Turner Daniels, MD;  Location: [facility];  Service: Gynecology;  Laterality: N/A;  with Sacrospinous Ligament Suspension  . CARPAL TUNNEL RELEASE Bilateral 04/01/2013   Procedure: BILATERAL CARPAL TUNNEL RELEASE;  Surgeon: Tami Ribas, MD;  Location: [facility];  Service: Orthopedics;  Laterality: Bilateral;  . COLONOSCOPY    . DILATION AND CURETTAGE OF UTERUS    . FOOT FASCIOTOMY  2005   both feet  . LAPAROSCOPIC ASSISTED VAGINAL HYSTERECTOMY  03/10/2011   Procedure: LAPAROSCOPIC ASSISTED VAGINAL HYSTERECTOMY;  Surgeon: Turner Daniels, MD;  Location: [facility];  Service: Gynecology;  Laterality: N/A;  . NOVASURE ABLATION    . SALPINGOOPHORECTOMY  03/10/2011   Procedure: SALPINGO OOPHERECTOMY;  Surgeon: Turner Daniels, MD;  Location: [facility];  Service: Gynecology;  Laterality: Bilateral;  . WISDOM TOOTH EXTRACTION     Social History: Social History   Socioeconomic History  . Marital status: Married    Spouse name: Not on file  . Number of children: Not on file  . Years of education: Not on file  . Highest education level: Not on file  Occupational History  . Not on file  Social Needs  . Financial resource strain: Not on file  . Food insecurity:    Worry: Not on file    Inability: Not on file  . Transportation needs:    Medical: Not on file    Non-medical: Not on file  Tobacco Use  . Smoking status: Never Smoker  . Smokeless tobacco: Never Used  Substance and Sexual Activity  . Alcohol use: No    Alcohol/week: 0.0 standard drinks    Comment: occasionally  . Drug use: No  . Sexual activity: Not on file  Lifestyle  . Physical activity:    Days per week: Not on file    Minutes per session: Not on file  . Stress: Not on file  Relationships  . Social connections:    Talks on phone: Not on file    Gets together: Not on file    Attends religious service: Not on file    Active member of club or organization: Not on file    Attends meetings of clubs or organizations: Not on file    Relationship status: Not  on file  Other Topics Concern  . Not on file  Social History Narrative  . Not on file   Family History: Family History  Problem Relation Age of Onset  . Cancer Mother   . Diabetes Father   . Cancer Sister    Allergies: Allergies  Allergen Reactions  . Adhesive [Tape] Hives and Other (See Comments)    Pt states that she is allergic to adhesive on Band-Aids.  . Banana     Burning sensation on tongue  . Diflucan [Fluconazole] Hives  . Penicillins Rash  . Sulfa Antibiotics Rash   Medications: See med rec.  Review of Systems: No fevers, chills, night sweats, weight loss, chest pain, or shortness of breath.    Objective:    General: Well Developed, well nourished, and in no acute distress.  Neuro: Alert and oriented x3, extra-ocular muscles intact, sensation grossly intact.  HEENT: Normocephalic, atraumatic, pupils equal round reactive to light, neck supple, no masses, no lymphadenopathy, thyroid nonpalpable.  Skin: Warm and dry, no rashes. Cardiac: Regular rate and rhythm, no murmurs rubs or gallops, no lower extremity edema.  Respiratory: Clear to auscultation bilaterally. Not using accessory muscles, speaking in full sentences. Neck: Negative spurling's Full neck range of motion Grip strength and sensation normal in bilateral hands Strength good C4 to T1 distribution No sensory change to C4 to T1 Reflexes normal Moderate tenderness in the left periscapular region.  Multiple tender trigger points.  Procedure:  Injection of #2 periscapular trigger point on the left Consent obtained and verified. Time-out conducted. Noted no overlying erythema, induration, or other signs of local infection. Skin prepped in a sterile fashion. Topical analgesic spray: Ethyl chloride. Completed without difficulty. Meds: 1 cc kenalog 40, 2 cc lidocaine, 2 cc bupivacaine spread out between the trigger points. Pain immediately improved suggesting accurate placement of the medication. Advised to call if fevers/chills, erythema, induration, drainage, or persistent bleeding.  Impression and Recommendations:    Radiculitis of left cervical region Continues to improve with conservative measures, #3 trigger point injections given today. Continue to use TENS unit. Because ultimately I think she is going to need epidural we are going to proceed with a cervical spine MRI. ___________________________________________ Ihor Austinhomas J. Benjamin Stainhekkekandam, M.D., ABFM., CAQSM. Primary Care and Sports Medicine [facility]  Adjunct Instructor of Family Medicine  [employer]

## 2017-12-29 ENCOUNTER — Ambulatory Visit (INDEPENDENT_AMBULATORY_CARE_PROVIDER_SITE_OTHER): Payer: 59 | Admitting: Psychology

## 2017-12-29 DIAGNOSIS — F900 Attention-deficit hyperactivity disorder, predominantly inattentive type: Secondary | ICD-10-CM | POA: Diagnosis not present

## 2017-12-29 DIAGNOSIS — F411 Generalized anxiety disorder: Secondary | ICD-10-CM | POA: Diagnosis not present

## 2017-12-29 DIAGNOSIS — Z634 Disappearance and death of family member: Secondary | ICD-10-CM | POA: Diagnosis not present

## 2018-01-10 ENCOUNTER — Ambulatory Visit (INDEPENDENT_AMBULATORY_CARE_PROVIDER_SITE_OTHER): Payer: 59 | Admitting: Psychology

## 2018-01-10 DIAGNOSIS — F411 Generalized anxiety disorder: Secondary | ICD-10-CM | POA: Diagnosis not present

## 2018-01-10 DIAGNOSIS — Z634 Disappearance and death of family member: Secondary | ICD-10-CM | POA: Diagnosis not present

## 2018-01-10 DIAGNOSIS — F900 Attention-deficit hyperactivity disorder, predominantly inattentive type: Secondary | ICD-10-CM | POA: Diagnosis not present

## 2018-01-17 ENCOUNTER — Other Ambulatory Visit: Payer: Self-pay | Admitting: Sports Medicine

## 2018-01-17 DIAGNOSIS — M5412 Radiculopathy, cervical region: Secondary | ICD-10-CM

## 2018-01-26 ENCOUNTER — Ambulatory Visit (INDEPENDENT_AMBULATORY_CARE_PROVIDER_SITE_OTHER): Payer: 59 | Admitting: Psychology

## 2018-01-26 ENCOUNTER — Ambulatory Visit: Payer: 59 | Admitting: Sports Medicine

## 2018-01-26 DIAGNOSIS — F900 Attention-deficit hyperactivity disorder, predominantly inattentive type: Secondary | ICD-10-CM | POA: Diagnosis not present

## 2018-01-26 DIAGNOSIS — F411 Generalized anxiety disorder: Secondary | ICD-10-CM | POA: Diagnosis not present

## 2018-01-26 DIAGNOSIS — Z634 Disappearance and death of family member: Secondary | ICD-10-CM

## 2018-02-07 DIAGNOSIS — N62 Hypertrophy of breast: Secondary | ICD-10-CM | POA: Diagnosis not present

## 2018-02-09 ENCOUNTER — Ambulatory Visit: Payer: 59 | Admitting: Psychology

## 2018-02-19 ENCOUNTER — Ambulatory Visit (INDEPENDENT_AMBULATORY_CARE_PROVIDER_SITE_OTHER): Payer: 59

## 2018-02-19 DIAGNOSIS — M47892 Other spondylosis, cervical region: Secondary | ICD-10-CM | POA: Diagnosis not present

## 2018-02-19 DIAGNOSIS — M47812 Spondylosis without myelopathy or radiculopathy, cervical region: Secondary | ICD-10-CM | POA: Diagnosis not present

## 2018-02-19 DIAGNOSIS — M5412 Radiculopathy, cervical region: Secondary | ICD-10-CM

## 2018-02-21 ENCOUNTER — Ambulatory Visit: Payer: 59 | Admitting: Psychology

## 2018-02-21 ENCOUNTER — Other Ambulatory Visit: Payer: Self-pay | Admitting: Plastic Surgery

## 2018-02-21 DIAGNOSIS — N62 Hypertrophy of breast: Secondary | ICD-10-CM

## 2018-02-26 ENCOUNTER — Ambulatory Visit
Admission: RE | Admit: 2018-02-26 | Discharge: 2018-02-26 | Disposition: A | Discharge status: Home / Self Care | Payer: 59 | Source: Ambulatory Visit | Attending: Plastic Surgery | Admitting: Plastic Surgery

## 2018-02-26 DIAGNOSIS — N62 Hypertrophy of breast: Secondary | ICD-10-CM

## 2018-02-26 DIAGNOSIS — R928 Other abnormal and inconclusive findings on diagnostic imaging of breast: Secondary | ICD-10-CM | POA: Diagnosis not present

## 2018-03-09 ENCOUNTER — Ambulatory Visit: Payer: 59 | Admitting: Psychology

## 2018-03-09 DIAGNOSIS — N62 Hypertrophy of breast: Secondary | ICD-10-CM | POA: Diagnosis not present

## 2018-03-18 ENCOUNTER — Other Ambulatory Visit: Payer: Self-pay | Admitting: Internal Medicine

## 2018-03-19 DIAGNOSIS — N62 Hypertrophy of breast: Secondary | ICD-10-CM | POA: Diagnosis not present

## 2018-03-21 ENCOUNTER — Ambulatory Visit: Payer: 59 | Admitting: Psychology

## 2018-04-02 ENCOUNTER — Emergency Department (INDEPENDENT_AMBULATORY_CARE_PROVIDER_SITE_OTHER)
Admission: EM | Admit: 2018-04-02 | Discharge: 2018-04-02 | Disposition: A | Discharge status: Home / Self Care | Payer: 59 | Source: Home / Self Care | Attending: Family Medicine | Admitting: Family Medicine

## 2018-04-02 ENCOUNTER — Other Ambulatory Visit: Payer: Self-pay

## 2018-04-02 ENCOUNTER — Emergency Department (INDEPENDENT_AMBULATORY_CARE_PROVIDER_SITE_OTHER): Payer: 59

## 2018-04-02 DIAGNOSIS — B9789 Other viral agents as the cause of diseases classified elsewhere: Secondary | ICD-10-CM

## 2018-04-02 DIAGNOSIS — S8002XA Contusion of left knee, initial encounter: Secondary | ICD-10-CM

## 2018-04-02 DIAGNOSIS — J069 Acute upper respiratory infection, unspecified: Secondary | ICD-10-CM

## 2018-04-02 DIAGNOSIS — M25561 Pain in right knee: Secondary | ICD-10-CM

## 2018-04-02 MED ORDER — BENZONATATE 200 MG PO CAPS: ORAL_CAPSULE | ORAL | 0 refills | Status: DC

## 2018-04-02 MED ORDER — PREDNISONE 20 MG PO TABS: ORAL_TABLET | ORAL | 0 refills | Status: DC

## 2018-04-02 NOTE — ED Triage Notes (Signed)
Patricia Arroyo on concrete on Monday and hit her left knee.  Pain in the middle of left knee.  Also coughing up greenish phlegm.

## 2018-04-02 NOTE — ED Provider Notes (Signed)
Patricia Arroyo [facility]    CSN: [id number] Arrival date & time: 04/02/18  1854     History   Chief Complaint Chief Complaint  Patient presents with  . Knee Pain    HPI Patricia Arroyo is a 52 y.o. female.   Patient presents with two complaints: 1)  She states that she fell one week ago, striking her left anterior knee on concrete.  She reports only minimal pain. 2)   Six days ago she developed typical cold-like symptoms developing over several days, including mild sore throat, sinus congestion, fatigue, and cough.  She visited an urgent care center where she was prescribed a Z-pak that she finished today.  She complains of persistent cough productive of greenish sputum.  She denies fevers, chills, and sweats, pleuritic pain, shortness of breath, wheezing, etc.  The history is provided by the patient.    Past Medical History:  Diagnosis Date  . ADHD (attention deficit hyperactivity disorder)   . Anxiety   . Carpal tunnel syndrome   . Depression    h/o---no meds now  . Diabetes mellitus without complication (HCC)   . GERD (gastroesophageal reflux disease)   . Hypertension   . IBS (irritable bowel syndrome)   . Varicose veins     Patient Active Problem List   Diagnosis Date Noted  . Radiculitis of left cervical region 08/17/2017  . Routine general medical examination at a health care facility 05/20/2016  . Ganglion cyst of wrist 09/03/2015  . Primary osteoarthritis of left knee 05/27/2015  . Nocturnal leg cramps 02/06/2015  . Diabetes type 2, controlled (HCC) 01/13/2015  . Fatty liver disease, nonalcoholic 01/13/2015  . Essential hypertension 04/20/2014  . IBS (irritable bowel syndrome) 04/20/2014  . GERD (gastroesophageal reflux disease) 04/20/2014  . Obesity 04/20/2014  . Varicose veins without complication 02/03/2014    Past Surgical History:  Procedure Laterality Date  . ANTERIOR AND POSTERIOR REPAIR  03/10/2011   Procedure: ANTERIOR (CYSTOCELE) AND POSTERIOR  REPAIR (RECTOCELE);  Surgeon: Turner Daniels, MD;  Location: [facility];  Service: Gynecology;  Laterality: N/A;  with Sacrospinous Ligament Suspension  . CARPAL TUNNEL RELEASE Bilateral 04/01/2013   Procedure: BILATERAL CARPAL TUNNEL RELEASE;  Surgeon: Tami Ribas, MD;  Location: [facility];  Service: Orthopedics;  Laterality: Bilateral;  . COLONOSCOPY    . DILATION AND CURETTAGE OF UTERUS    . FOOT FASCIOTOMY  2005   both feet  . LAPAROSCOPIC ASSISTED VAGINAL HYSTERECTOMY  03/10/2011   Procedure: LAPAROSCOPIC ASSISTED VAGINAL HYSTERECTOMY;  Surgeon: Turner Daniels, MD;  Location: [facility];  Service: Gynecology;  Laterality: N/A;  . NOVASURE ABLATION    . SALPINGOOPHORECTOMY  03/10/2011   Procedure: SALPINGO OOPHERECTOMY;  Surgeon: Turner Daniels, MD;  Location: [facility];  Service: Gynecology;  Laterality: Bilateral;  . WISDOM TOOTH EXTRACTION      OB History    Gravida  2   Para  2   Term  2   Preterm      AB      Living  2     SAB      TAB      Ectopic      Multiple      Live Births               Home Medications    Prior to Admission medications   Medication Sig Start Date End Date Taking? Authorizing Provider  amphetamine-dextroamphetamine (ADDERALL XR) 15 MG 24 hr capsule  Take 30 mg by mouth.     [provider]  benzonatate (TESSALON) 200 MG capsule Take one cap by mouth at bedtime as needed for cough.  May repeat in 4 to 6 hours 04/02/18   Lattie HawBeese, Serigne Kubicek A, MD  clotrimazole-betamethasone (LOTRISONE) cream Apply 1 application topically 2 (two) times daily. 05/25/15   Veryl Speakalone, Gregory D, FNP  diphenoxylate-atropine (LOMOTIL) 2.5-0.025 MG tablet Take 1 tablet by mouth 4 (four) times daily as needed for diarrhea or loose stools. 12/21/17   Myrlene Brokerrawford, Elizabeth A, MD  estradiol (ESTRACE) 2 MG tablet Take 1 tablet (2 mg total) by mouth daily. 03/12/11 01/13/15  Candice CampLowe, David, MD  etodolac (LODINE XL) 500 MG 24 hr tablet TAKE 1 TABLET BY MOUTH EVERY DAY  01/17/18   Monica Bectonhekkekandam, Thomas J, MD  ketoconazole (NIZORAL) 2 % cream Apply 1 application topically daily. 05/19/16   Myrlene Brokerrawford, Elizabeth A, MD  lisinopril (PRINIVIL,ZESTRIL) 10 MG tablet Take 1 tablet (10 mg total) by mouth daily. 10/30/17   Myrlene Brokerrawford, Elizabeth A, MD  magnesium oxide (MAG-OX) 400 MG tablet TAKE 2 TABLETS AT BEDTIME 06/25/17   Monica Bectonhekkekandam, Thomas J, MD  omeprazole (PRILOSEC) 40 MG capsule TAKE 1 CAPSULE BY MOUTH EVERY DAY 03/19/18   Myrlene Brokerrawford, Elizabeth A, MD  predniSONE (DELTASONE) 20 MG tablet Take one tab by mouth twice daily for 4 days, then one daily for 3 days. Take with food. 04/02/18   Lattie HawBeese, Rhiann Boucher A, MD  triamterene-hydrochlorothiazide (MAXZIDE-25) 37.5-25 MG tablet Take 1 tablet by mouth daily. 12/21/17   Myrlene Brokerrawford, Elizabeth A, MD  zolpidem (AMBIEN) 10 MG tablet Take 10 mg by mouth at bedtime as needed. sleep     [provider]    Family History Family History  Problem Relation Age of Onset  . Cancer Mother   . Diabetes Father   . Cancer Sister     Social History Social History   Tobacco Use  . Smoking status: Never Smoker  . Smokeless tobacco: Never Used  Substance Use Topics  . Alcohol use: No    Alcohol/week: 0.0 standard drinks    Comment: occasionally  . Drug use: No     Allergies   Adhesive [tape]; Banana; Diflucan [fluconazole]; Penicillins; and Sulfa antibiotics   Review of Systems Review of Systems + sore throat + cough No pleuritic pain No wheezing + nasal congestion + post-nasal drainage No sinus pain/pressure No itchy/red eyes No earache No hemoptysis No SOB No fever/chills No nausea No vomiting No abdominal pain No diarrhea No urinary symptoms No skin rash + fatigue No myalgias No headache    Physical Exam Triage Vital Signs ED Triage Vitals  Enc Vitals Group     BP 04/02/18 2007 138/77     Pulse Rate 04/02/18 2007 85     Resp 04/02/18 2007 18     Temp 04/02/18 2007 (!) 97.3 F (36.3 C)     Temp  Source 04/02/18 2007 Oral     SpO2 04/02/18 2007 97 %     Weight 04/02/18 2008 181 lb (82.1 kg)     Height 04/02/18 2008 5\' 2"  (1.575 m)     Head Circumference --      Peak Flow --      Pain Score 04/02/18 2008 0     Pain Loc --      Pain Edu? --      Excl. in GC? --    No data found.  Updated Vital Signs BP 138/77 (BP Location: Right Arm)  Pulse 85   Temp (!) 97.3 F (36.3 C) (Oral)   Resp 18   Ht 5\' 2"  (1.575 m)   Wt 82.1 kg   SpO2 97%   BMI 33.11 kg/m   Visual Acuity Right Eye Distance:   Left Eye Distance:   Bilateral Distance:    Right Eye Near:   Left Eye Near:    Bilateral Near:     Physical Exam Nursing notes and Vital Signs reviewed. Appearance:  Patient appears stated age, and in no acute distress Eyes:  Pupils are equal, round, and reactive to light and accomodation.  Extraocular movement is intact.  Conjunctivae are not inflamed  Ears:  Canals normal.  Tympanic membranes normal.  Nose:  Mildly congested turbinates.  No sinus tenderness.    Pharynx:  Normal Neck:  Supple.  No adenopathy Lungs:  Clear to auscultation.  Breath sounds are equal.  Moving air well. Heart:  Regular rate and rhythm without murmurs, rubs, or gallops.  Abdomen:  Nontender without masses or hepatosplenomegaly.  Bowel sounds are present.  No CVA or flank tenderness.  Extremities:   Left knee:  No effusion, erythema, ecchymosis, tenderness, or warmth.  Knee stable, negative drawer test.  McMurray test negative.  Skin:  No rash present.    UC Treatments / Results  Labs (all labs ordered are listed, but only abnormal results are displayed) Labs Reviewed - No data to display  EKG None  Radiology Dg Knee Complete 4 Views Left  Result Date: 04/02/2018 CLINICAL DATA:  Knee pain after fall EXAM: LEFT KNEE - COMPLETE 4+ VIEW COMPARISON:  02/03/2015 FINDINGS: No fracture or malalignment. Mild patellofemoral and medial degenerative change. No significant knee effusion. IMPRESSION: No  acute osseous abnormality. Electronically Signed   By: Jasmine PangKim  Fujinaga M.D.   On: 04/02/2018 20:19    Procedures Procedures (including critical care time)  Medications Ordered in UC Medications - No data to display  Initial Impression / Assessment and Plan / UC Course  I have reviewed the triage vital signs and the nursing notes.  Pertinent labs & imaging results that were available during my care of the patient were reviewed by me and considered in my medical decision making (see chart for details).    Left knee appears to have no significant injury; patient reassured. Patient finished a Z-pak today; there is no evidence of bacterial infection today.   Begin prednisone burst/taper.  Prescription written for Benzonatate [facility](Tessalon) to take at bedtime for night-time cough.  Followup with Family Doctor if not improved in one week.    Final Clinical Impressions(s) / UC Diagnoses   Final diagnoses:  Contusion of left knee, initial encounter  Viral URI with cough     Discharge Instructions     Take plain guaifenesin (1200mg  extended release tabs such as Mucinex) twice daily, with plenty of water, for cough and congestion.  Get adequate rest.   May use Afrin nasal spray (or generic oxymetazoline) each morning for about 5 days and then discontinue.  Also recommend using saline nasal spray several times daily and saline nasal irrigation (AYR is a common brand).  Use Flonase nasal spray each morning after using Afrin nasal spray and saline nasal irrigation. Try warm salt water gargles for sore throat.  Stop all antihistamines for now, and other non-prescription cough/cold preparations.        ED Prescriptions    Medication Sig Dispense Auth. Provider   predniSONE (DELTASONE) 20 MG tablet Take one tab by mouth  twice daily for 4 days, then one daily for 3 days. Take with food. 11 tablet Lattie Haw, MD   benzonatate (TESSALON) 200 MG capsule Take one cap by mouth at bedtime as needed  for cough.  May repeat in 4 to 6 hours 15 capsule Cathren Harsh Tera Mater, MD         Lattie Haw, MD 04/10/18 1110

## 2018-04-02 NOTE — Discharge Instructions (Addendum)
Take plain guaifenesin (1200mg extended release tabs such as Mucinex) twice daily, with plenty of water, for cough and congestion.  Get adequate rest.   °May use Afrin nasal spray (or generic oxymetazoline) each morning for about 5 days and then discontinue.  Also recommend using saline nasal spray several times daily and saline nasal irrigation (AYR is a common brand).  Use Flonase nasal spray each morning after using Afrin nasal spray and saline nasal irrigation. °Try warm salt water gargles for sore throat.  °Stop all antihistamines for now, and other non-prescription cough/cold preparations. °  °  °

## 2018-04-04 HISTORY — PX: ABDOMINOPLASTY: SUR9

## 2018-04-04 HISTORY — PX: BREAST REDUCTION SURGERY: SHX8

## 2018-04-23 ENCOUNTER — Other Ambulatory Visit: Payer: Self-pay | Admitting: Internal Medicine

## 2018-05-01 ENCOUNTER — Other Ambulatory Visit: Payer: Self-pay | Admitting: Plastic Surgery

## 2018-05-03 ENCOUNTER — Ambulatory Visit: Payer: Self-pay | Admitting: *Deleted

## 2018-05-03 NOTE — Telephone Encounter (Signed)
Okay to take cipro. 1 pill twice a day for 3 days is sufficient.

## 2018-05-03 NOTE — Telephone Encounter (Signed)
Pt states she has awful pain when she urinates. She has Cipro at home already and would like to know would that be okay to take? She just had a tummy tuck and breast reduction and says she can not move to come in to see doctor. Please advise.    Patient had surgery 1/28 ( patient lost a lot of weight and had surgery to remove extra skin)- patient had catheter in place post surgical. Patient reports she has pain after she urinates and odor with urination. Patient states she has pain every time she urinates. Patient is post surgical and having a lot of abdominal pain. Patient has contacted the surgeon and they do not have a lab. Patient has history of UTI and she has Cipro at her house. It is very old( 2010). Patient request new Rx- can it be called in? Patient states she can have her husband can bring sample. Reason for Disposition . [1] SEVERE pain with urination  (e.g., excruciating) AND [2] not improved after 2 hours of pain medicine and Sitz bath    Patient is post surgical with history of catheter. Surgery 05/01/18.  Answer Assessment - Initial Assessment Questions 1. SYMPTOM: "What's the main symptom you're concerned about?" (e.g., frequency, incontinence)     Pain with urination 2. ONSET: "When did the  pain  start?"     As soon as patient was able to urinate on her on 3. PAIN: "Is there any pain?" If so, ask: "How bad is it?" (Scale: 1-10; mild, moderate, severe)     Afterwards- 12 4. CAUSE: "What do you think is causing the symptoms?"     UTI from catherter 5. OTHER SYMPTOMS: "Do you have any other symptoms?" (e.g., fever, flank pain, blood in urine, pain with urination)     Pain with urination 6. PREGNANCY: "Is there any chance you are pregnant?" "When was your last menstrual period?"     n/a  Protocols used: URINATION PAIN - FEMALE-A-AH, URINARY [facility]

## 2018-05-03 NOTE — Telephone Encounter (Signed)
Patient needs a script sent into pharmacy, her cipro she has has expired.

## 2018-05-04 MED ORDER — CIPROFLOXACIN HCL 500 MG PO TABS: 500.0000 mg | ORAL_TABLET | Freq: Two times a day (BID) | ORAL | 0 refills | Status: DC

## 2018-05-04 NOTE — Telephone Encounter (Signed)
Patients phone went right to voicemail and voicemail full routing to [facility] incase patient calls back

## 2018-05-04 NOTE — Addendum Note (Signed)
Addended by: Hillard Danker A on: 05/04/2018 10:22 AM   Modules accepted: Orders

## 2018-05-04 NOTE — Telephone Encounter (Signed)
If expired within last year okay to take as is.

## 2018-05-04 NOTE — Telephone Encounter (Signed)
Sent in

## 2018-05-04 NOTE — Telephone Encounter (Signed)
Expired in 2010

## 2018-05-28 ENCOUNTER — Telehealth: Payer: Self-pay

## 2018-05-28 NOTE — Telephone Encounter (Signed)
Copied from CRM [id number]. Topic: General - Other >> May 28, 2018  2:39 PM Fanny Bien wrote: Reason for CRM: pt called and stated that she needs the letter that states that she has diabetes. Note was made on July 3rd 2019. She would like this faxed to Dr Rozanna Boer so that she can have an eye exam every year instead of every two years. Fax#[phone]

## 2018-05-28 NOTE — Telephone Encounter (Signed)
Letter faxed.

## 2018-06-24 ENCOUNTER — Other Ambulatory Visit: Payer: Self-pay | Admitting: Internal Medicine

## 2018-07-30 ENCOUNTER — Telehealth: Payer: Self-pay | Admitting: Internal Medicine

## 2018-07-30 NOTE — Telephone Encounter (Signed)
Copied from CRM [id number]. Topic: Quick Communication - Rx Refill/Question >> Jul 30, 2018  4:33 PM Mcneil, Jannifer Rodney wrote: Pt requests a 90 day supply of the following medications. Pt stated her pharmacy had given her a 30 supply but it costs the same for both a 30 day supply and 90 day supply.   Medication: lisinopril (PRINIVIL,ZESTRIL) 10 MG tablet and triamterene-hydrochlorothiazide (MAXZIDE-25) 37.5-25 MG tablet  Has the patient contacted their pharmacy? yes   Preferred Pharmacy (with phone number or street name): [id number] - [place], [place] - [address] [phone] (Phone) [phone] (Fax)  Agent: Please be advised that RX refills may take up to 3 business days. We ask that you follow-up with your pharmacy.

## 2018-07-31 MED ORDER — TRIAMTERENE-HCTZ 37.5-25 MG PO TABS: 1.0000 | ORAL_TABLET | Freq: Every day | ORAL | 0 refills | Status: DC

## 2018-07-31 MED ORDER — LISINOPRIL 10 MG PO TABS: 10.0000 mg | ORAL_TABLET | Freq: Every day | ORAL | 0 refills | Status: DC

## 2018-07-31 NOTE — Telephone Encounter (Signed)
I have resent 90 day supply, but I am not sure why a 30 day supply was ever filled because only 90 day supplies were sent in

## 2018-09-11 ENCOUNTER — Other Ambulatory Visit: Payer: Self-pay | Admitting: Internal Medicine

## 2018-10-15 ENCOUNTER — Ambulatory Visit: Payer: 59 | Admitting: Internal Medicine

## 2018-10-22 ENCOUNTER — Telehealth: Payer: Self-pay | Admitting: Internal Medicine

## 2018-10-22 NOTE — Telephone Encounter (Signed)
Patient is scheduled for her CPE in August. Patient wanted to know if Dr. Sharlet Salina would be able to enter her labs for her to have done prior to her CPE appt date?

## 2018-10-23 ENCOUNTER — Other Ambulatory Visit: Payer: Self-pay | Admitting: Internal Medicine

## 2018-10-23 ENCOUNTER — Ambulatory Visit (INDEPENDENT_AMBULATORY_CARE_PROVIDER_SITE_OTHER): Payer: 59 | Admitting: Family

## 2018-10-23 ENCOUNTER — Encounter: Payer: Self-pay | Admitting: Family

## 2018-10-23 DIAGNOSIS — W57XXXA Bitten or stung by nonvenomous insect and other nonvenomous arthropods, initial encounter: Secondary | ICD-10-CM | POA: Diagnosis not present

## 2018-10-23 DIAGNOSIS — L089 Local infection of the skin and subcutaneous tissue, unspecified: Secondary | ICD-10-CM | POA: Diagnosis not present

## 2018-10-23 MED ORDER — TERCONAZOLE 0.4 % VA CREA: 1.0000 | TOPICAL_CREAM | Freq: Every day | VAGINAL | 0 refills | Status: DC

## 2018-10-23 MED ORDER — DOXYCYCLINE HYCLATE 100 MG PO TABS: 100.0000 mg | ORAL_TABLET | Freq: Two times a day (BID) | ORAL | 0 refills | Status: DC

## 2018-10-23 NOTE — Progress Notes (Signed)
Patricia Arroyo is a 53 y.o. female with the following history as recorded in EpicCare:  Patient Active Problem List   Diagnosis Date Noted  . Radiculitis of left cervical region 08/17/2017  . Routine general medical examination at a health care facility 05/20/2016  . Ganglion cyst of wrist 09/03/2015  . Primary osteoarthritis of left knee 05/27/2015  . Nocturnal leg cramps 02/06/2015  . Diabetes type 2, controlled ([place]) 01/13/2015  . Fatty liver disease, nonalcoholic 16/01/9603  . Essential hypertension 04/20/2014  . IBS (irritable bowel syndrome) 04/20/2014  . GERD (gastroesophageal reflux disease) 04/20/2014  . Obesity 04/20/2014  . Varicose veins without complication 54/12/8117    Current Outpatient Medications  Medication Sig Dispense Refill  . amphetamine-dextroamphetamine (ADDERALL XR) 15 MG 24 hr capsule Take 30 mg by mouth.     . benzonatate (TESSALON) 200 MG capsule Take one cap by mouth at bedtime as needed for cough.  May repeat in 4 to 6 hours 15 capsule 0  . clotrimazole-betamethasone (LOTRISONE) cream Apply 1 application topically 2 (two) times daily. 30 g 0  . diphenoxylate-atropine (LOMOTIL) 2.5-0.025 MG tablet Take 1 tablet by mouth 4 (four) times daily as needed for diarrhea or loose stools. 90 tablet 0  . doxycycline (VIBRA-TABS) 100 MG tablet Take 1 tablet (100 mg total) by mouth 2 (two) times daily. 14 tablet 0  . estradiol (ESTRACE) 2 MG tablet Take 1 tablet (2 mg total) by mouth daily. 30 tablet 12  . etodolac (LODINE XL) 500 MG 24 hr tablet TAKE 1 TABLET BY MOUTH EVERY DAY 30 tablet 3  . ketoconazole (NIZORAL) 2 % cream Apply 1 application topically daily. 80 g 0  . lisinopril (ZESTRIL) 10 MG tablet Take 1 tablet (10 mg total) by mouth daily. Annual appt is due must see provider for future refills 30 tablet 0  . magnesium oxide (MAG-OX) 400 MG tablet TAKE 2 TABLETS AT BEDTIME 90 tablet 3  . omeprazole (PRILOSEC) 40 MG capsule TAKE 1 CAPSULE BY MOUTH EVERY DAY 90  capsule 1  . terconazole (TERAZOL 7) 0.4 % vaginal cream Place 1 applicator vaginally at bedtime. 45 g 0  . triamterene-hydrochlorothiazide (MAXZIDE-25) 37.5-25 MG tablet Take 1 tablet by mouth daily. Need visit before next refill 90 tablet 0  . zolpidem (AMBIEN) 10 MG tablet Take 10 mg by mouth at bedtime as needed. sleep      No current facility-administered medications for this visit.     Allergies: Adhesive [tape], Banana, Diflucan [fluconazole], Penicillins, and Sulfa antibiotics  Past Medical History:  Diagnosis Date  . ADHD (attention deficit hyperactivity disorder)   . Anxiety   . Carpal tunnel syndrome   . Depression    h/o---no meds now  . Diabetes mellitus without complication ([place])   . GERD (gastroesophageal reflux disease)   . Hypertension   . IBS (irritable bowel syndrome)   . Varicose veins     Past Surgical History:  Procedure Laterality Date  . ANTERIOR AND POSTERIOR REPAIR  03/10/2011   Procedure: ANTERIOR (CYSTOCELE) AND POSTERIOR REPAIR (RECTOCELE);  Surgeon: Luz Lex, MD;  Location: [facility];  Service: Gynecology;  Laterality: N/A;  with Sacrospinous Ligament Suspension  . CARPAL TUNNEL RELEASE Bilateral 04/01/2013   Procedure: BILATERAL CARPAL TUNNEL RELEASE;  Surgeon: Tennis Must, MD;  Location: [facility];  Service: Orthopedics;  Laterality: Bilateral;  . COLONOSCOPY    . DILATION AND CURETTAGE OF UTERUS    . FOOT FASCIOTOMY  2005  both feet  . LAPAROSCOPIC ASSISTED VAGINAL HYSTERECTOMY  03/10/2011   Procedure: LAPAROSCOPIC ASSISTED VAGINAL HYSTERECTOMY;  Surgeon: Turner Danielsavid C Lowe, MD;  Location: [facility];  Service: Gynecology;  Laterality: N/A;  . NOVASURE ABLATION    . SALPINGOOPHORECTOMY  03/10/2011   Procedure: SALPINGO OOPHERECTOMY;  Surgeon: Turner Danielsavid C Lowe, MD;  Location: [facility];  Service: Gynecology;  Laterality: Bilateral;  . WISDOM TOOTH EXTRACTION      Family History  Problem Relation Age of Onset  . Cancer Mother   . Diabetes Father    . Cancer Sister     Social History   Tobacco Use  . Smoking status: Never Smoker  . Smokeless tobacco: Never Used  Substance Use Topics  . Alcohol use: No    Alcohol/week: 0.0 standard drinks    Comment: occasionally    Subjective:     I connected with Patricia Arroyo on 10/23/18 at 10:00 AM EDT by a video enabled telemedicine application and verified that I am speaking with the correct person using two identifiers. Patient and I are the only 2 people on the video call.   I discussed the limitations of evaluation and management by telemedicine and the availability of in person appointments. The patient expressed understanding and agreed to proceed.  Patient is concerned about skin rash/ insect bites; was recently on vacation at a rental home and founds signs of bed bugs on one of the mattresses; has developed itchy, pustular lesions on her abdomen and extremities; symptoms present x 1 week; using topical Benadryl;   Objective:  There were no vitals filed for this visit.  General: Well developed, well nourished, in no acute distress  Skin : Warm and dry. Erythematous, pustular lesions noted on abdomen/ extremities Head: Normocephalic and atraumatic  Lungs: Respirations unlabored; Neurologic: Alert and oriented; speech intact; face symmetrical;   Assessment:  1. Bedbug bite, initial encounter   2. Skin infection     Plan:  Rx for Doxycycline 100 mg bid x 7 days; she defers oral antihistamine due to fatigue concerns- continue topical Benadryl; agree it is appropriate to reach out to the homeowner of the rental due to the stress caused by the bed bugs; follow-up worse, no better.   No follow-ups on file.  No orders of the defined types were placed in this encounter.   Requested Prescriptions   Signed Prescriptions Disp Refills  . doxycycline (VIBRA-TABS) 100 MG tablet 14 tablet 0    Sig: Take 1 tablet (100 mg total) by mouth 2 (two) times daily.  Marland Kitchen. terconazole (TERAZOL 7) 0.4  % vaginal cream 45 g 0    Sig: Place 1 applicator vaginally at bedtime.

## 2018-10-29 NOTE — Telephone Encounter (Signed)
No, with the pandemic we are compressing visits and same day labs only at this time.

## 2018-10-29 NOTE — Telephone Encounter (Signed)
Pt contacted and informed of same.  

## 2018-11-05 ENCOUNTER — Encounter: Payer: Self-pay | Admitting: Internal Medicine

## 2018-11-05 ENCOUNTER — Other Ambulatory Visit: Payer: Self-pay

## 2018-11-05 ENCOUNTER — Ambulatory Visit (INDEPENDENT_AMBULATORY_CARE_PROVIDER_SITE_OTHER): Payer: 59 | Admitting: Internal Medicine

## 2018-11-05 ENCOUNTER — Other Ambulatory Visit (INDEPENDENT_AMBULATORY_CARE_PROVIDER_SITE_OTHER): Payer: 59

## 2018-11-05 VITALS — BP 130/80 | HR 81 | Temp 98.8°F | Ht 62.0 in | Wt 165.0 lb

## 2018-11-05 DIAGNOSIS — Z0000 Encounter for general adult medical examination without abnormal findings: Secondary | ICD-10-CM | POA: Diagnosis not present

## 2018-11-05 DIAGNOSIS — K580 Irritable bowel syndrome with diarrhea: Secondary | ICD-10-CM

## 2018-11-05 DIAGNOSIS — E119 Type 2 diabetes mellitus without complications: Secondary | ICD-10-CM

## 2018-11-05 DIAGNOSIS — K219 Gastro-esophageal reflux disease without esophagitis: Secondary | ICD-10-CM

## 2018-11-05 DIAGNOSIS — I10 Essential (primary) hypertension: Secondary | ICD-10-CM | POA: Diagnosis not present

## 2018-11-05 LAB — CBC
HCT: 39.4 % (ref 36.0–46.0)
Hemoglobin: 13.1 g/dL (ref 12.0–15.0)
MCHC: 33.3 g/dL (ref 30.0–36.0)
MCV: 87.9 fl (ref 78.0–100.0)
Platelets: 186 10*3/uL (ref 150.0–400.0)
RBC: 4.48 Mil/uL (ref 3.87–5.11)
RDW: 15.1 % (ref 11.5–15.5)
WBC: 7.3 10*3/uL (ref 4.0–10.5)

## 2018-11-05 LAB — COMPREHENSIVE METABOLIC PANEL
ALT: 15 U/L (ref 0–35)
AST: 16 U/L (ref 0–37)
Albumin: 4.4 g/dL (ref 3.5–5.2)
Alkaline Phosphatase: 84 U/L (ref 39–117)
BUN: 14 mg/dL (ref 6–23)
CO2: 31 mEq/L (ref 19–32)
Calcium: 9.6 mg/dL (ref 8.4–10.5)
Chloride: 100 mEq/L (ref 96–112)
Creatinine, Ser: 0.73 mg/dL (ref 0.40–1.20)
GFR: 82.65 mL/min (ref >60.00)
Glucose, Bld: 88 mg/dL (ref 70–99)
Potassium: 3.2 mEq/L — ABNORMAL LOW (ref 3.5–5.1)
Sodium: 140 mEq/L (ref 135–145)
Total Bilirubin: 0.6 mg/dL (ref 0.2–1.2)
Total Protein: 7.2 g/dL (ref 6.0–8.3)

## 2018-11-05 LAB — LIPID PANEL
Cholesterol: 217 mg/dL — ABNORMAL HIGH (ref 0–200)
HDL: 65.4 mg/dL (ref >39.00)
LDL Cholesterol: 136 mg/dL — ABNORMAL HIGH (ref 0–99)
NonHDL: 151.16
Total CHOL/HDL Ratio: 3
Triglycerides: 75 mg/dL (ref 0.0–149.0)
VLDL: 15 mg/dL (ref 0.0–40.0)

## 2018-11-05 LAB — HEMOGLOBIN A1C: Hgb A1c MFr Bld: 6 % (ref 4.6–6.5)

## 2018-11-05 MED ORDER — DIPHENOXYLATE-ATROPINE 2.5-0.025 MG PO TABS: 4.0000 | ORAL_TABLET | Freq: Four times a day (QID) | ORAL | 1 refills | Status: DC | PRN

## 2018-11-05 NOTE — Assessment & Plan Note (Signed)
Wants to stop some meds if able since weight loss. Will stop lisinopril and keep maxzide currently. If doing well can half dose maxzide and if okay with that can stop. Suspect she will not be able to stop maxzide just given BP today. Checking CMP and adjust as needed.

## 2018-11-05 NOTE — Assessment & Plan Note (Signed)
Refill lomotil today and she uses only when flares which are rare.

## 2018-11-05 NOTE — Assessment & Plan Note (Signed)
Taking omeprazole and will continue.  

## 2018-11-05 NOTE — Assessment & Plan Note (Signed)
Flu shot yearly. Shingrix counseled. Tetanus up to date. Colonoscopy up to date. Mammogram up to date, pap smear up to date. Counseled about sun safety and mole surveillance. Counseled about the dangers of distracted driving. Given 10 year screening recommendations.

## 2018-11-05 NOTE — Patient Instructions (Signed)
Health Maintenance, Female Adopting a healthy lifestyle and getting preventive care are important in promoting health and wellness. Ask your health care provider about:  The right schedule for you to have regular tests and exams.  Things you can do on your own to prevent diseases and keep yourself healthy. What should I know about diet, weight, and exercise? Eat a healthy diet   Eat a diet that includes plenty of vegetables, fruits, low-fat dairy products, and lean protein.  Do not eat a lot of foods that are high in solid fats, added sugars, or sodium. Maintain a healthy weight Body mass index (BMI) is used to identify weight problems. It estimates body fat based on height and weight. Your health care provider can help determine your BMI and help you achieve or maintain a healthy weight. Get regular exercise Get regular exercise. This is one of the most important things you can do for your health. Most adults should:  Exercise for at least 150 minutes each week. The exercise should increase your heart rate and make you sweat (moderate-intensity exercise).  Do strengthening exercises at least twice a week. This is in addition to the moderate-intensity exercise.  Spend less time sitting. Even light physical activity can be beneficial. Watch cholesterol and blood lipids Have your blood tested for lipids and cholesterol at 55 years of age, then have this test every 5 years. Have your cholesterol levels checked more often if:  Your lipid or cholesterol levels are high.  You are older than 55 years of age.  You are at high risk for heart disease. What should I know about cancer screening? Depending on your health history and family history, you may need to have cancer screening at various ages. This may include screening for:  Breast cancer.  Cervical cancer.  Colorectal cancer.  Skin cancer.  Lung cancer. What should I know about heart disease, diabetes, and high blood  pressure? Blood pressure and heart disease  High blood pressure causes heart disease and increases the risk of stroke. This is more likely to develop in people who have high blood pressure readings, are of African descent, or are overweight.  Have your blood pressure checked: ? Every 3-5 years if you are 18-39 years of age. ? Every year if you are 40 years old or older. Diabetes Have regular diabetes screenings. This checks your fasting blood sugar level. Have the screening done:  Once every three years after age 40 if you are at a normal weight and have a low risk for diabetes.  More often and at a younger age if you are overweight or have a high risk for diabetes. What should I know about preventing infection? Hepatitis B If you have a higher risk for hepatitis B, you should be screened for this virus. Talk with your health care provider to find out if you are at risk for hepatitis B infection. Hepatitis C Testing is recommended for:  Everyone born from 1945 through 1965.  Anyone with known risk factors for hepatitis C. Sexually transmitted infections (STIs)  Get screened for STIs, including gonorrhea and chlamydia, if: ? You are sexually active and are younger than 55 years of age. ? You are older than 55 years of age and your health care provider tells you that you are at risk for this type of infection. ? Your sexual activity has changed since you were last screened, and you are at increased risk for chlamydia or gonorrhea. Ask your health care provider if   you are at risk.  Ask your health care provider about whether you are at high risk for HIV. Your health care provider may recommend a prescription medicine to help prevent HIV infection. If you choose to take medicine to prevent HIV, you should first get tested for HIV. You should then be tested every 3 months for as long as you are taking the medicine. Pregnancy  If you are about to stop having your period (premenopausal) and  you may become pregnant, seek counseling before you get pregnant.  Take 400 to 800 micrograms (mcg) of folic acid every day if you become pregnant.  Ask for birth control (contraception) if you want to prevent pregnancy. Osteoporosis and menopause Osteoporosis is a disease in which the bones lose minerals and strength with aging. This can result in bone fractures. If you are 65 years old or older, or if you are at risk for osteoporosis and fractures, ask your health care provider if you should:  Be screened for bone loss.  Take a calcium or vitamin D supplement to lower your risk of fractures.  Be given hormone replacement therapy (HRT) to treat symptoms of menopause. Follow these instructions at home: Lifestyle  Do not use any products that contain nicotine or tobacco, such as cigarettes, e-cigarettes, and chewing tobacco. If you need help quitting, ask your health care provider.  Do not use street drugs.  Do not share needles.  Ask your health care provider for help if you need support or information about quitting drugs. Alcohol use  Do not drink alcohol if: ? Your health care provider tells you not to drink. ? You are pregnant, may be pregnant, or are planning to become pregnant.  If you drink alcohol: ? Limit how much you use to 0-1 drink a day. ? Limit intake if you are breastfeeding.  Be aware of how much alcohol is in your drink. In the [place] one drink equals one 12 oz bottle of beer (355 mL), one 5 oz glass of wine (148 mL), or one 1 oz glass of hard liquor (44 mL). General instructions  Schedule regular health, dental, and eye exams.  Stay current with your vaccines.  Tell your health care provider if: ? You often feel depressed. ? You have ever been abused or do not feel safe at home. Summary  Adopting a healthy lifestyle and getting preventive care are important in promoting health and wellness.  Follow your health care provider's instructions about healthy  diet, exercising, and getting tested or screened for diseases.  Follow your health care provider's instructions on monitoring your cholesterol and blood pressure. This information is not intended to replace advice given to you by your health care provider. Make sure you discuss any questions you have with your health care provider. Document Released: 10/04/2010 Document Revised: 03/14/2018 Document Reviewed: 03/14/2018 Elsevier Patient Education  2020 [employer]vier Inc.  

## 2018-11-05 NOTE — Assessment & Plan Note (Signed)
Diet controlled. Checking HgA1c, foot exam done.

## 2018-11-05 NOTE — Progress Notes (Signed)
   Subjective:   Patient ID: Patricia Arroyo, female    DOB: 20-Dec-1965, 53 y.o.   MRN: [id number]  HPI The patient is a 53 YO female coming in for physical. Had breast reduction and stomach surgery for abdominal wall skin since our last visit.   PMH, [facility], social history reviewed and updated  Review of Systems  Constitutional: Negative.   HENT: Negative.   Eyes: Negative.   Respiratory: Negative for cough, chest tightness and shortness of breath.   Cardiovascular: Negative for chest pain, palpitations and leg swelling.  Gastrointestinal: Negative for abdominal distention, abdominal pain, constipation, diarrhea, nausea and vomiting.  Musculoskeletal: Negative.   Skin: Negative.   Neurological: Negative.   Psychiatric/Behavioral: Negative.     Objective:  Physical Exam Constitutional:      Appearance: She is well-developed.  HENT:     Head: Normocephalic and atraumatic.  Neck:     Musculoskeletal: Normal range of motion.  Cardiovascular:     Rate and Rhythm: Normal rate and regular rhythm.  Pulmonary:     Effort: Pulmonary effort is normal. No respiratory distress.     Breath sounds: Normal breath sounds. No wheezing or rales.  Abdominal:     General: Bowel sounds are normal. There is no distension.     Palpations: Abdomen is soft.     Tenderness: There is no abdominal tenderness. There is no rebound.  Skin:    General: Skin is warm and dry.  Neurological:     Mental Status: She is alert and oriented to person, place, and time.     Coordination: Coordination normal.     Vitals:   11/05/18 1336  BP: 130/80  Pulse: 81  Temp: 98.8 F (37.1 C)  TempSrc: Oral  SpO2: 95%  Weight: 165 lb (74.8 kg)  Height: 5\' 2"  (1.575 m)    Assessment & Plan:

## 2018-11-14 ENCOUNTER — Other Ambulatory Visit: Payer: Self-pay | Admitting: Internal Medicine

## 2018-12-08 ENCOUNTER — Other Ambulatory Visit: Payer: Self-pay | Admitting: Internal Medicine

## 2019-01-10 ENCOUNTER — Encounter: Payer: Self-pay | Admitting: Sports Medicine

## 2019-01-10 ENCOUNTER — Other Ambulatory Visit: Payer: Self-pay

## 2019-01-10 ENCOUNTER — Ambulatory Visit (INDEPENDENT_AMBULATORY_CARE_PROVIDER_SITE_OTHER): Payer: 59 | Admitting: Sports Medicine

## 2019-01-10 ENCOUNTER — Ambulatory Visit (INDEPENDENT_AMBULATORY_CARE_PROVIDER_SITE_OTHER): Payer: 59

## 2019-01-10 DIAGNOSIS — M25532 Pain in left wrist: Secondary | ICD-10-CM | POA: Diagnosis not present

## 2019-01-10 DIAGNOSIS — M5412 Radiculopathy, cervical region: Secondary | ICD-10-CM | POA: Diagnosis not present

## 2019-01-10 MED ORDER — ETODOLAC ER 500 MG PO TB24: 500.0000 mg | ORAL_TABLET | Freq: Every day | ORAL | 3 refills | Status: DC

## 2019-01-10 MED ORDER — PREDNISONE 50 MG PO TABS: ORAL_TABLET | ORAL | 0 refills | Status: DC

## 2019-01-10 NOTE — Assessment & Plan Note (Signed)
Pain over the dorsal radiocarpal joint, very mild, prednisone, etodolac. X-rays. Wrist rehab exercises given, return in 4 to 6 weeks for this. I think this is a secondary issue, I think the major problem is radicular from her neck.

## 2019-01-10 NOTE — Progress Notes (Signed)
Subjective:    CC: Persistent neck and shoulder pain  HPI: Patricia Arroyo returns, she has neck pain radiating down into her left periscapular region and shoulder, we have treated her for cervical radiculitis in the past, she does have mild DDD.  She is agreeable to pursue a conservative course again.  No progressive weakness, trauma, constitutional symptoms.  In addition she has noted a vague pain over the dorsum of her left wrist, no swelling but she does have a job that requires repetitive motion.  I reviewed the past medical history, family history, social history, surgical history, and allergies today and no changes were needed.  Please see the problem list section below in epic for further details.  Past Medical History: Past Medical History:  Diagnosis Date  . ADHD (attention deficit hyperactivity disorder)   . Anxiety   . Carpal tunnel syndrome   . Depression    h/o---no meds now  . Diabetes mellitus without complication ([place])   . GERD (gastroesophageal reflux disease)   . Hypertension   . IBS (irritable bowel syndrome)   . Varicose veins    Past Surgical History: Past Surgical History:  Procedure Laterality Date  . ABDOMINOPLASTY  2020  . ANTERIOR AND POSTERIOR REPAIR  03/10/2011   Procedure: ANTERIOR (CYSTOCELE) AND POSTERIOR REPAIR (RECTOCELE);  Surgeon: Luz Lex, MD;  Location: [facility];  Service: Gynecology;  Laterality: N/A;  with Sacrospinous Ligament Suspension  . BREAST REDUCTION SURGERY Bilateral 2020  . CARPAL TUNNEL RELEASE Bilateral 04/01/2013   Procedure: BILATERAL CARPAL TUNNEL RELEASE;  Surgeon: Tennis Must, MD;  Location: [facility];  Service: Orthopedics;  Laterality: Bilateral;  . COLONOSCOPY    . DILATION AND CURETTAGE OF UTERUS    . FOOT FASCIOTOMY  2005   both feet  . LAPAROSCOPIC ASSISTED VAGINAL HYSTERECTOMY  03/10/2011   Procedure: LAPAROSCOPIC ASSISTED VAGINAL HYSTERECTOMY;  Surgeon: Luz Lex, MD;  Location: [facility];  Service:  Gynecology;  Laterality: N/A;  . NOVASURE ABLATION    . SALPINGOOPHORECTOMY  03/10/2011   Procedure: SALPINGO OOPHERECTOMY;  Surgeon: Luz Lex, MD;  Location: [facility];  Service: Gynecology;  Laterality: Bilateral;  . WISDOM TOOTH EXTRACTION     Social History: Social History   Socioeconomic History  . Marital status: Married    Spouse name: Not on file  . Number of children: Not on file  . Years of education: Not on file  . Highest education level: Not on file  Occupational History  . Not on file  Social Needs  . Financial resource strain: Not on file  . Food insecurity    Worry: Not on file    Inability: Not on file  . Transportation needs    Medical: Not on file    Non-medical: Not on file  Tobacco Use  . Smoking status: Never Smoker  . Smokeless tobacco: Never Used  Substance and Sexual Activity  . Alcohol use: No    Alcohol/week: 0.0 standard drinks    Comment: occasionally  . Drug use: No  . Sexual activity: Not on file  Lifestyle  . Physical activity    Days per week: Not on file    Minutes per session: Not on file  . Stress: Not on file  Relationships  . Social Herbalist on phone: Not on file    Gets together: Not on file    Attends religious service: Not on file    Active member of club or organization:  Not on file    Attends meetings of clubs or organizations: Not on file    Relationship status: Not on file  Other Topics Concern  . Not on file  Social History Narrative  . Not on file   Family History: Family History  Problem Relation Age of Onset  . Cancer Mother   . Diabetes Father   . Cancer Sister    Allergies: Allergies  Allergen Reactions  . Adhesive [Tape] Hives and Other (See Comments)    Pt states that she is allergic to adhesive on Band-Aids.  . Banana     Burning sensation on tongue  . Diflucan [Fluconazole] Hives  . Penicillins Rash  . Sulfa Antibiotics Rash   Medications: See med rec.  Review of Systems: No  fevers, chills, night sweats, weight loss, chest pain, or shortness of breath.   Objective:    General: Well Developed, well nourished, and in no acute distress.  Neuro: Alert and oriented x3, extra-ocular muscles intact, sensation grossly intact.  HEENT: Normocephalic, atraumatic, pupils equal round reactive to light, neck supple, no masses, no lymphadenopathy, thyroid nonpalpable.  Skin: Warm and dry, no rashes. Cardiac: Regular rate and rhythm, no murmurs rubs or gallops, no lower extremity edema.  Respiratory: Clear to auscultation bilaterally. Not using accessory muscles, speaking in full sentences. Left wrist: Inspection normal with no visible erythema or swelling. ROM smooth and normal with good flexion and extension and ulnar/radial deviation that is symmetrical with opposite wrist. Palpation is normal over metacarpals, navicular, lunate, and TFCC; tendons without tenderness/ swelling No snuffbox tenderness. No tenderness over Canal of Guyon. Strength 5/5 in all directions without pain. Negative tinel's and phalens signs. Negative Finkelstein sign. Negative Watson's test.  Impression and Recommendations:    Left wrist pain Pain over the dorsal radiocarpal joint, very mild, prednisone, etodolac. X-rays. Wrist rehab exercises given, return in 4 to 6 weeks for this. I think this is a secondary issue, I think the major problem is radicular from her neck.  Radiculitis of left cervical region Had responded well to conservative measures in the past, we did give her some trigger point injections back in September 2019. MRI did show some ligamentum flavum hypertrophy. We will treat this as a primary wrist issue initially, and if persistent discomfort we will probably get a new cervical spine MRI and proceed with epidural.   ___________________________________________ [name]. Benjamin Stain, M.D., ABFM., CAQSM. Primary Care and Sports Medicine [facility]   Adjunct Professor of Family Medicine  [employer]

## 2019-01-10 NOTE — Assessment & Plan Note (Signed)
Had responded well to conservative measures in the past, we did give her some trigger point injections back in September 2019. MRI did show some ligamentum flavum hypertrophy. We will treat this as a primary wrist issue initially, and if persistent discomfort we will probably get a new cervical spine MRI and proceed with epidural.

## 2019-01-11 MED ORDER — ETODOLAC ER 500 MG PO TB24: 500.0000 mg | ORAL_TABLET | Freq: Every day | ORAL | 3 refills | Status: DC

## 2019-01-11 NOTE — Addendum Note (Signed)
Addended by: Silverio Decamp on: 01/11/2019 08:58 AM   Modules accepted: Orders

## 2019-01-21 ENCOUNTER — Ambulatory Visit (INDEPENDENT_AMBULATORY_CARE_PROVIDER_SITE_OTHER): Payer: 59 | Admitting: Sports Medicine

## 2019-01-21 ENCOUNTER — Encounter: Payer: Self-pay | Admitting: Sports Medicine

## 2019-01-21 ENCOUNTER — Other Ambulatory Visit: Payer: Self-pay

## 2019-01-21 VITALS — BP 116/76 | HR 97 | Ht 62.0 in | Wt 168.0 lb

## 2019-01-21 DIAGNOSIS — M5412 Radiculopathy, cervical region: Secondary | ICD-10-CM | POA: Diagnosis not present

## 2019-01-21 NOTE — Progress Notes (Signed)
Subjective:    CC: Bilateral dorsal hand pain  HPI: Patricia Arroyo is a 53 year old woman with a history significant for cervical radiculitis who presents today with continued pain in her bilateral dorsal writs. She was seen for this 11 days prior and prescribed prednisone and etodolac. She did not take the prednisone due to confusion about dosing. She also reports muscular pain over her L shoulder which does not radiate nor limit her motion.   I reviewed the past medical history, family history, social history, surgical history, and allergies today and no changes were needed.  Please see the problem list section below in epic for further details.  Past Medical History: Past Medical History:  Diagnosis Date  . ADHD (attention deficit hyperactivity disorder)   . Anxiety   . Carpal tunnel syndrome   . Depression    h/o---no meds now  . Diabetes mellitus without complication ([place])   . GERD (gastroesophageal reflux disease)   . Hypertension   . IBS (irritable bowel syndrome)   . Varicose veins    Past Surgical History: Past Surgical History:  Procedure Laterality Date  . ABDOMINOPLASTY  2020  . ANTERIOR AND POSTERIOR REPAIR  03/10/2011   Procedure: ANTERIOR (CYSTOCELE) AND POSTERIOR REPAIR (RECTOCELE);  Surgeon: Luz Lex, MD;  Location: [facility];  Service: Gynecology;  Laterality: N/A;  with Sacrospinous Ligament Suspension  . BREAST REDUCTION SURGERY Bilateral 2020  . CARPAL TUNNEL RELEASE Bilateral 04/01/2013   Procedure: BILATERAL CARPAL TUNNEL RELEASE;  Surgeon: Tennis Must, MD;  Location: [facility];  Service: Orthopedics;  Laterality: Bilateral;  . COLONOSCOPY    . DILATION AND CURETTAGE OF UTERUS    . FOOT FASCIOTOMY  2005   both feet  . LAPAROSCOPIC ASSISTED VAGINAL HYSTERECTOMY  03/10/2011   Procedure: LAPAROSCOPIC ASSISTED VAGINAL HYSTERECTOMY;  Surgeon: Luz Lex, MD;  Location: [facility];  Service: Gynecology;  Laterality: N/A;  . NOVASURE ABLATION    .  SALPINGOOPHORECTOMY  03/10/2011   Procedure: SALPINGO OOPHERECTOMY;  Surgeon: Luz Lex, MD;  Location: [facility];  Service: Gynecology;  Laterality: Bilateral;  . WISDOM TOOTH EXTRACTION     Social History: Social History   Socioeconomic History  . Marital status: Married    Spouse name: Not on file  . Number of children: Not on file  . Years of education: Not on file  . Highest education level: Not on file  Occupational History  . Not on file  Social Needs  . Financial resource strain: Not on file  . Food insecurity    Worry: Not on file    Inability: Not on file  . Transportation needs    Medical: Not on file    Non-medical: Not on file  Tobacco Use  . Smoking status: Never Smoker  . Smokeless tobacco: Never Used  Substance and Sexual Activity  . Alcohol use: No    Alcohol/week: 0.0 standard drinks    Comment: occasionally  . Drug use: No  . Sexual activity: Not on file  Lifestyle  . Physical activity    Days per week: Not on file    Minutes per session: Not on file  . Stress: Not on file  Relationships  . Social Herbalist on phone: Not on file    Gets together: Not on file    Attends religious service: Not on file    Active member of club or organization: Not on file    Attends meetings of  clubs or organizations: Not on file    Relationship status: Not on file  Other Topics Concern  . Not on file  Social History Narrative  . Not on file   Family History: Family History  Problem Relation Age of Onset  . Cancer Mother   . Diabetes Father   . Cancer Sister    Allergies: Allergies  Allergen Reactions  . Adhesive [Tape] Hives and Other (See Comments)    Pt states that she is allergic to adhesive on Band-Aids.  . Banana     Burning sensation on tongue  . Diflucan [Fluconazole] Hives  . Penicillins Rash  . Sulfa Antibiotics Rash   Medications: See med rec.  Review of Systems: No fevers, chills, night sweats, weight loss, chest pain, or  shortness of breath.   Objective:    General: Well Developed, well nourished, and in no acute distress.  Neuro: Alert and oriented x3, extra-ocular muscles intact, sensation grossly intact.  HEENT: Normocephalic, atraumatic. Skin: Warm and dry, no rashes. Cardiac: Regular rate and rhythm, no lower extremity edema.  Respiratory: Not using accessory muscles, speaking in full sentences.  Wrists: Inspection normal with no visible erythema or swelling. ROM smooth and normal with good flexion and extension and ulnar/radial deviation that is symmetrical. Palpation is normal over metacarpals, navicular, lunate, and TFCC; tendons without tenderness/ swelling. No tenderness over Canal of Guyon. Strength 5/5 in all directions without pain. Negative Finkelstein, tinel's and phalens.  A/P: Patricia Arroyo presents with bilateral wrist pain that is worse with the repetitive activities at her work. Given her recent X-ray of the L wrist showing OA and having not taken the prescribed prednisone this problem is likely the same as she was previously seen for. She was educated on the dosing regimen of a prednisone burst and instructed to take her previous prescription.   Her scapular point tenderness is likely due to a muscle spasm of the trapezius. It was addressed with a trigger point injection.  Impression and Recommendations:    No problem-specific Assessment & Plan notes found for this encounter.   ___________________________________________ [name]. Benjamin Stain, M.D., ABFM., CAQSM. Primary Care and Sports Medicine [facility]  Adjunct Professor of Family Medicine  [employer]

## 2019-02-20 ENCOUNTER — Telehealth: Payer: Self-pay | Admitting: *Deleted

## 2019-02-20 DIAGNOSIS — I83813 Varicose veins of bilateral lower extremities with pain: Secondary | ICD-10-CM

## 2019-02-20 NOTE — Telephone Encounter (Signed)
Referral placed.

## 2019-02-20 NOTE — Telephone Encounter (Signed)
Copied from [place] [id number]. Topic: Referral - Request for Referral >> Feb 15, 2019  4:12 PM Erick Blinks wrote: Has patient seen PCP for this complaint? Yes *If NO, is insurance requiring patient see PCP for this issue before PCP can refer them? Referral for which specialty: Vein specialist Preferred provider/office: [facility], [facility] -Either Dr. Doren Custard or whoever has been there the longest  Reason for referral: Pain, throbbing, and they are like ropes.

## 2019-02-21 ENCOUNTER — Ambulatory Visit (INDEPENDENT_AMBULATORY_CARE_PROVIDER_SITE_OTHER): Payer: 59

## 2019-02-21 ENCOUNTER — Other Ambulatory Visit: Payer: Self-pay

## 2019-02-21 ENCOUNTER — Ambulatory Visit (INDEPENDENT_AMBULATORY_CARE_PROVIDER_SITE_OTHER): Payer: 59 | Admitting: Sports Medicine

## 2019-02-21 ENCOUNTER — Encounter: Payer: Self-pay | Admitting: Sports Medicine

## 2019-02-21 DIAGNOSIS — M5412 Radiculopathy, cervical region: Secondary | ICD-10-CM

## 2019-02-21 DIAGNOSIS — M25532 Pain in left wrist: Secondary | ICD-10-CM

## 2019-02-21 NOTE — Assessment & Plan Note (Signed)
X-ray showed radiocarpal degenerative changes, scaphotrapezial degenerative changes, because some of this pain could be radicular we are going to treat her neck first before considering wrist injections.

## 2019-02-21 NOTE — Progress Notes (Signed)
Subjective:    CC: Follow-up  HPI: Patricia Arroyo returns, she is a 53 year old female with cervical DDD, she has failed greater than 6 weeks of physician directed conservative measures, continues to have pain in the left side of the neck with radiation to the left trapezius, we did trigger point injections, home rehab at the last visit, prednisone, she had a partial response and then worsened.  In addition she has left radiocarpal degenerative changes, persistent discomfort.  I reviewed the past medical history, family history, social history, surgical history, and allergies today and no changes were needed.  Please see the problem list section below in epic for further details.  Past Medical History: Past Medical History:  Diagnosis Date  . ADHD (attention deficit hyperactivity disorder)   . Anxiety   . Carpal tunnel syndrome   . Depression    h/o---no meds now  . Diabetes mellitus without complication ([facility])   . GERD (gastroesophageal reflux disease)   . Hypertension   . IBS (irritable bowel syndrome)   . Varicose veins    Past Surgical History: Past Surgical History:  Procedure Laterality Date  . ABDOMINOPLASTY  2020  . ANTERIOR AND POSTERIOR REPAIR  03/10/2011   Procedure: ANTERIOR (CYSTOCELE) AND POSTERIOR REPAIR (RECTOCELE);  Surgeon: Turner Daniels, MD;  Location: [facility];  Service: Gynecology;  Laterality: N/A;  with Sacrospinous Ligament Suspension  . BREAST REDUCTION SURGERY Bilateral 2020  . CARPAL TUNNEL RELEASE Bilateral 04/01/2013   Procedure: BILATERAL CARPAL TUNNEL RELEASE;  Surgeon: Tami Ribas, MD;  Location: [facility];  Service: Orthopedics;  Laterality: Bilateral;  . COLONOSCOPY    . DILATION AND CURETTAGE OF UTERUS    . FOOT FASCIOTOMY  2005   both feet  . LAPAROSCOPIC ASSISTED VAGINAL HYSTERECTOMY  03/10/2011   Procedure: LAPAROSCOPIC ASSISTED VAGINAL HYSTERECTOMY;  Surgeon: Turner Daniels, MD;  Location: [facility];  Service: Gynecology;  Laterality: N/A;   . NOVASURE ABLATION    . SALPINGOOPHORECTOMY  03/10/2011   Procedure: SALPINGO OOPHERECTOMY;  Surgeon: Turner Daniels, MD;  Location: [facility];  Service: Gynecology;  Laterality: Bilateral;  . WISDOM TOOTH EXTRACTION     Social History: Social History   Socioeconomic History  . Marital status: Married    Spouse name: Not on file  . Number of children: Not on file  . Years of education: Not on file  . Highest education level: Not on file  Occupational History  . Not on file  Social Needs  . Financial resource strain: Not on file  . Food insecurity    Worry: Not on file    Inability: Not on file  . Transportation needs    Medical: Not on file    Non-medical: Not on file  Tobacco Use  . Smoking status: Never Smoker  . Smokeless tobacco: Never Used  Substance and Sexual Activity  . Alcohol use: No    Alcohol/week: 0.0 standard drinks    Comment: occasionally  . Drug use: No  . Sexual activity: Not on file  Lifestyle  . Physical activity    Days per week: Not on file    Minutes per session: Not on file  . Stress: Not on file  Relationships  . Social Musician on phone: Not on file    Gets together: Not on file    Attends religious service: Not on file    Active member of club or organization: Not on file    Attends meetings of  clubs or organizations: Not on file    Relationship status: Not on file  Other Topics Concern  . Not on file  Social History Narrative  . Not on file   Family History: Family History  Problem Relation Age of Onset  . Cancer Mother   . Diabetes Father   . Cancer Sister    Allergies: Allergies  Allergen Reactions  . Adhesive [Tape] Hives and Other (See Comments)    Pt states that she is allergic to adhesive on Band-Aids.  . Banana     Burning sensation on tongue  . Diflucan [Fluconazole] Hives  . Penicillins Rash  . Sulfa Antibiotics Rash   Medications: See med rec.  Review of Systems: No fevers, chills, night sweats,  weight loss, chest pain, or shortness of breath.   Objective:    General: Well Developed, well nourished, and in no acute distress.  Neuro: Alert and oriented x3, extra-ocular muscles intact, sensation grossly intact.  HEENT: Normocephalic, atraumatic, pupils equal round reactive to light, neck supple, no masses, no lymphadenopathy, thyroid nonpalpable.  Skin: Warm and dry, no rashes. Cardiac: Regular rate and rhythm, no murmurs rubs or gallops, no lower extremity edema.  Respiratory: Clear to auscultation bilaterally. Not using accessory muscles, speaking in full sentences.  Impression and Recommendations:    Left wrist pain X-ray showed radiocarpal degenerative changes, scaphotrapezial degenerative changes, because some of this pain could be radicular we are going to treat her neck first before considering wrist injections.  Radiculitis of left cervical region Did not did not respond to conservative treatment, proceeding with MRI, adding additional formal physical therapy. Certainly after 4 to 6 weeks of additional formal physical therapy we could proceed with a left cervical epidural.   ___________________________________________ Gwen Her. Dianah Field, M.D., ABFM., CAQSM. Primary Care and Sports Medicine [facility]  Adjunct Professor of [place] [employer]

## 2019-02-21 NOTE — Assessment & Plan Note (Signed)
Did not did not respond to conservative treatment, proceeding with MRI, adding additional formal physical therapy. Certainly after 4 to 6 weeks of additional formal physical therapy we could proceed with a left cervical epidural.

## 2019-02-24 ENCOUNTER — Emergency Department (HOSPITAL_BASED_OUTPATIENT_CLINIC_OR_DEPARTMENT_OTHER)

## 2019-02-24 ENCOUNTER — Emergency Department (HOSPITAL_BASED_OUTPATIENT_CLINIC_OR_DEPARTMENT_OTHER)
Admission: EM | Admit: 2019-02-24 | Discharge: 2019-02-24 | Disposition: A | Discharge status: Home / Self Care | Attending: Emergency Medicine | Admitting: Emergency Medicine

## 2019-02-24 ENCOUNTER — Encounter (HOSPITAL_BASED_OUTPATIENT_CLINIC_OR_DEPARTMENT_OTHER): Payer: Self-pay

## 2019-02-24 ENCOUNTER — Other Ambulatory Visit: Payer: Self-pay

## 2019-02-24 DIAGNOSIS — Y9289 Other specified places as the place of occurrence of the external cause: Secondary | ICD-10-CM | POA: Insufficient documentation

## 2019-02-24 DIAGNOSIS — Y9389 Activity, other specified: Secondary | ICD-10-CM | POA: Diagnosis not present

## 2019-02-24 DIAGNOSIS — Z79899 Other long term (current) drug therapy: Secondary | ICD-10-CM | POA: Insufficient documentation

## 2019-02-24 DIAGNOSIS — Y990 Civilian activity done for income or pay: Secondary | ICD-10-CM | POA: Diagnosis not present

## 2019-02-24 DIAGNOSIS — E119 Type 2 diabetes mellitus without complications: Secondary | ICD-10-CM | POA: Insufficient documentation

## 2019-02-24 DIAGNOSIS — S8992XA Unspecified injury of left lower leg, initial encounter: Secondary | ICD-10-CM | POA: Diagnosis present

## 2019-02-24 DIAGNOSIS — S8002XA Contusion of left knee, initial encounter: Secondary | ICD-10-CM | POA: Insufficient documentation

## 2019-02-24 DIAGNOSIS — W010XXA Fall on same level from slipping, tripping and stumbling without subsequent striking against object, initial encounter: Secondary | ICD-10-CM | POA: Diagnosis not present

## 2019-02-24 DIAGNOSIS — I10 Essential (primary) hypertension: Secondary | ICD-10-CM | POA: Insufficient documentation

## 2019-02-24 NOTE — ED Triage Notes (Signed)
Pt reports that this will be workman's comp. Visit.

## 2019-02-24 NOTE — ED Provider Notes (Signed)
[facility] EMERGENCY DEPARTMENT Provider Note   CSN: [id number] Arrival date & time: 02/24/19  1735     History   Chief Complaint Chief Complaint  Patient presents with  . Knee Injury    HPI Cleatrice BurkeDonna L Arroyo is a 53 y.o. female with a past medical history of GERD, hypertension presenting to the ED with a chief complaint of left knee pain.  Earlier today several hours ago slipped on something at work and then fell directly onto her left knee.  Has had persistent pain since then.  History of similar fall several years ago that did not require intervention.  She has not tried medications to help with her symptoms.  Denies any head injuries, back pain, joint swelling, redness, history of septic joint.     HPI  Past Medical History:  Diagnosis Date  . ADHD (attention deficit hyperactivity disorder)   . Anxiety   . Carpal tunnel syndrome   . Depression    h/o---no meds now  . Diabetes mellitus without complication (HCC)   . GERD (gastroesophageal reflux disease)   . Hypertension   . IBS (irritable bowel syndrome)   . Varicose veins     Patient Active Problem List   Diagnosis Date Noted  . Left wrist pain 01/10/2019  . Radiculitis of left cervical region 08/17/2017  . Routine general medical examination at a health care facility 05/20/2016  . Ganglion cyst of wrist 09/03/2015  . Primary osteoarthritis of left knee 05/27/2015  . Nocturnal leg cramps 02/06/2015  . Diabetes type 2, controlled (HCC) 01/13/2015  . Fatty liver disease, nonalcoholic 01/13/2015  . Essential hypertension 04/20/2014  . IBS (irritable bowel syndrome) 04/20/2014  . GERD (gastroesophageal reflux disease) 04/20/2014  . Obesity 04/20/2014  . Varicose veins without complication 02/03/2014    Past Surgical History:  Procedure Laterality Date  . ABDOMINOPLASTY  2020  . ANTERIOR AND POSTERIOR REPAIR  03/10/2011   Procedure: ANTERIOR (CYSTOCELE) AND POSTERIOR REPAIR (RECTOCELE);  Surgeon: Turner Danielsavid C  Lowe, MD;  Location: [facility];  Service: Gynecology;  Laterality: N/A;  with Sacrospinous Ligament Suspension  . BREAST REDUCTION SURGERY Bilateral 2020  . CARPAL TUNNEL RELEASE Bilateral 04/01/2013   Procedure: BILATERAL CARPAL TUNNEL RELEASE;  Surgeon: Tami RibasKevin R Kuzma, MD;  Location: [facility];  Service: Orthopedics;  Laterality: Bilateral;  . COLONOSCOPY    . DILATION AND CURETTAGE OF UTERUS    . FOOT FASCIOTOMY  2005   both feet  . LAPAROSCOPIC ASSISTED VAGINAL HYSTERECTOMY  03/10/2011   Procedure: LAPAROSCOPIC ASSISTED VAGINAL HYSTERECTOMY;  Surgeon: Turner Danielsavid C Lowe, MD;  Location: [facility];  Service: Gynecology;  Laterality: N/A;  . NOVASURE ABLATION    . SALPINGOOPHORECTOMY  03/10/2011   Procedure: SALPINGO OOPHERECTOMY;  Surgeon: Turner Danielsavid C Lowe, MD;  Location: [facility];  Service: Gynecology;  Laterality: Bilateral;  . WISDOM TOOTH EXTRACTION       OB History    Gravida  2   Para  2   Term  2   Preterm      AB      Living  2     SAB      TAB      Ectopic      Multiple      Live Births               Home Medications    Prior to Admission medications   Medication Sig Start Date End Date Taking? Authorizing Provider  amphetamine-dextroamphetamine (ADDERALL  XR) 15 MG 24 hr capsule Take 30 mg by mouth.     [provider]  clotrimazole-betamethasone (LOTRISONE) cream Apply 1 application topically 2 (two) times daily. 05/25/15   Golden Circle, FNP  diphenoxylate-atropine (LOMOTIL) 2.5-0.025 MG tablet Take 4 tablets by mouth 4 (four) times daily as needed for diarrhea or loose stools. 11/05/18   Hoyt Koch, MD  estradiol (ESTRACE) 2 MG tablet Take 1 tablet (2 mg total) by mouth daily. 03/12/11 01/13/15  Louretta Shorten, MD  etodolac (LODINE XL) 500 MG 24 hr tablet Take 1 tablet (500 mg total) by mouth daily. 01/11/19   Silverio Decamp, MD  ketoconazole (NIZORAL) 2 % cream Apply 1 application topically daily. 05/19/16   Hoyt Koch, MD  lisinopril (ZESTRIL) 10 MG tablet Take 1 tablet (10 mg total) by mouth daily. 11/14/18   Hoyt Koch, MD  magnesium oxide (MAG-OX) 400 MG tablet TAKE 2 TABLETS AT BEDTIME 06/25/17   Silverio Decamp, MD  omeprazole (PRILOSEC) 40 MG capsule TAKE 1 CAPSULE BY MOUTH EVERY DAY 09/11/18   Hoyt Koch, MD  terconazole (TERAZOL 7) 0.4 % vaginal cream Place 1 applicator vaginally at bedtime. 10/23/18   Marrian Salvage, FNP  triamterene-hydrochlorothiazide (MAXZIDE-25) 37.5-25 MG tablet Take 1 tablet by mouth daily. 12/12/18   Hoyt Koch, MD  zolpidem (AMBIEN) 10 MG tablet Take 10 mg by mouth at bedtime as needed. sleep     [provider]    Family History Family History  Problem Relation Age of Onset  . Cancer Mother   . Diabetes Father   . Cancer Sister     Social History Social History   Tobacco Use  . Smoking status: Never Smoker  . Smokeless tobacco: Never Used  Substance Use Topics  . Alcohol use: No    Alcohol/week: 0.0 standard drinks    Comment: occasionally  . Drug use: No     Allergies   Adhesive [tape], Banana, Diflucan [fluconazole], Penicillins, and Sulfa antibiotics   Review of Systems Review of Systems  Constitutional: Negative for chills and fever.  Musculoskeletal: Positive for arthralgias. Negative for joint swelling and myalgias.  Skin: Negative for wound.     Physical Exam Updated Vital Signs BP (!) 139/96 (BP Location: Right Arm)   Pulse 93   Temp 98.3 F (36.8 C) (Oral)   Resp 18   Ht 5\' 2"  (1.575 m)   Wt 73.5 kg   SpO2 99%   BMI 29.63 kg/m   Physical Exam Vitals signs and nursing note reviewed.  Constitutional:      General: She is not in acute distress.    Appearance: She is well-developed. She is not diaphoretic.  HENT:     Head: Normocephalic and atraumatic.  Eyes:     General: No scleral icterus.    Conjunctiva/sclera: Conjunctivae normal.  Neck:     Musculoskeletal: Normal  range of motion.  Pulmonary:     Effort: Pulmonary effort is normal. No respiratory distress.  Musculoskeletal: Normal range of motion.        General: Tenderness present. No swelling or deformity.     Comments: Tenderness to palpation of the left patella without overlying skin changes or deformities.  Able to perform full active and passive range of motion without difficulty.  No calf tenderness.  No erythema or warmth of joint.  Distal pulses intact.  Skin:    Findings: No rash.  Neurological:     Mental  Status: She is alert.      ED Treatments / Results  Labs (all labs ordered are listed, but only abnormal results are displayed) Labs Reviewed - No data to display  EKG None  Radiology Dg Knee Complete 4 Views Left  Result Date: 02/24/2019 CLINICAL DATA:  Left anterior patellar pain after fall today on concrete, prior hx knee injury with fluid drawn off years ago EXAM: LEFT KNEE - COMPLETE 4+ VIEW COMPARISON:  None. FINDINGS: No evidence of fracture, dislocation, or joint effusion. No evidence of arthropathy or other focal bone abnormality. Soft tissues are unremarkable. IMPRESSION: Negative. Electronically Signed   By: Amie Portland M.D.   On: 02/24/2019 19:18    Procedures Procedures (including critical care time)  Medications Ordered in ED Medications - No data to display   Initial Impression / Assessment and Plan / ED Course  I have reviewed the triage vital signs and the nursing notes.  Pertinent labs & imaging results that were available during my care of the patient were reviewed by me and considered in my medical decision making (see chart for details).        53 year old female presents to ED for left knee pain after falling directly onto the knee while at work today.  She slipped on something on the floor.  No overlying skin changes although there is tenderness palpation of the left patella without deformity.  Areas neurovascularly intact.  She remains  ambulatory.  X-rays negative.  Will advise RICE therapy, knee sleeve and PCP follow-up.  Patient is hemodynamically stable, in NAD, and able to ambulate in the ED. Evaluation does not show pathology that would require ongoing emergent intervention or inpatient treatment. I explained the diagnosis to the patient. Pain has been managed and has no complaints prior to discharge. Patient is comfortable with above plan and is stable for discharge at this time. All questions were answered prior to disposition. Strict return precautions for returning to the ED were discussed. Encouraged follow up with PCP.   An After Visit Summary was printed and given to the patient.   Portions of this note were generated with Scientist, clinical (histocompatibility and immunogenetics). Dictation errors may occur despite best attempts at proofreading.   Final Clinical Impressions(s) / ED Diagnoses   Final diagnoses:  Contusion of left knee, initial encounter    ED Discharge Orders    None       Dietrich Pates, PA-C 02/24/19 1944    Cathren Laine, MD 02/25/19 1335

## 2019-02-24 NOTE — Discharge Instructions (Addendum)
He will need to apply ice, elevate your leg to help with symptoms. Take the pain medication that you have at home. Follow-up with your primary care provider. Return to the ED if you start to experience worsening pain, redness, swelling, trouble moving your knee.

## 2019-02-24 NOTE — ED Triage Notes (Signed)
Pt refusing to sit in waiting room, requesting to be sat away from others related to Covid. Pt placed near vending machine waiting area.

## 2019-02-24 NOTE — ED Triage Notes (Signed)
Pt states that she was walking today and slipped on something and landed on her left knee, catching herself with her hands.

## 2019-02-25 ENCOUNTER — Other Ambulatory Visit: Payer: Self-pay | Admitting: Internal Medicine

## 2019-02-25 ENCOUNTER — Ambulatory Visit (INDEPENDENT_AMBULATORY_CARE_PROVIDER_SITE_OTHER): Payer: 59

## 2019-02-25 DIAGNOSIS — H9193 Unspecified hearing loss, bilateral: Secondary | ICD-10-CM

## 2019-02-25 DIAGNOSIS — M5412 Radiculopathy, cervical region: Secondary | ICD-10-CM

## 2019-03-06 ENCOUNTER — Ambulatory Visit: Payer: 59

## 2019-03-11 ENCOUNTER — Ambulatory Visit: Payer: 59

## 2019-03-23 ENCOUNTER — Other Ambulatory Visit: Payer: Self-pay | Admitting: Internal Medicine

## 2019-04-16 ENCOUNTER — Encounter: Payer: Self-pay | Admitting: Internal Medicine

## 2019-06-03 ENCOUNTER — Ambulatory Visit: Payer: 59

## 2019-06-26 ENCOUNTER — Telehealth: Payer: Self-pay | Admitting: Internal Medicine

## 2019-06-26 NOTE — Telephone Encounter (Signed)
    Patient requesting last labs be faxed to [facility]-  diabetes management program she is enrolled in at her job  Fax to [employer] [phone]

## 2019-06-27 NOTE — Telephone Encounter (Signed)
Labs faxed to [facility].

## 2019-09-04 ENCOUNTER — Other Ambulatory Visit: Payer: Self-pay | Admitting: Internal Medicine

## 2019-11-12 ENCOUNTER — Other Ambulatory Visit: Payer: Self-pay

## 2019-11-12 ENCOUNTER — Ambulatory Visit: Payer: 59 | Admitting: Sports Medicine

## 2019-11-12 DIAGNOSIS — M5412 Radiculopathy, cervical region: Secondary | ICD-10-CM

## 2019-11-12 NOTE — Progress Notes (Signed)
    Procedures performed today:    None.  Independent interpretation of notes and tests performed by another provider:   None.  Brief History, Exam, Impression, and Recommendations:    Patricia Arroyo is a pleasant 54yo female who presents with increasing pain in her left shoulder. 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 Sometimes the pain will go down her arm. She has a history of cervical radiculitis that has responded to trigger point injections in the past. We did a trigger point injection to provide relief. We are also providivng a note to her work stating she can only work her regular scheduled hours the next 3 months.   Aurelio Jew, MS3   ___________________________________________ [name]. Benjamin Stain, M.D., ABFM., CAQSM. Primary Care and Sports Medicine [place] [facility]  Adjunct Instructor of Family Medicine  [employer]

## 2019-11-12 NOTE — Assessment & Plan Note (Signed)
Patricia Arroyo has a recurrence of left-sided periscapular pain, likely radicular from the cervical spine, and occasionally down her left arm. Historically trigger point injections have provided good relief. We repeated a trigger point injection today, restarting cervical spine rehabilitation exercises, light duty at work for 3 months. If no better after a couple of months we can consider a cervical epidural.

## 2019-11-13 ENCOUNTER — Other Ambulatory Visit: Payer: Self-pay | Admitting: Internal Medicine

## 2019-11-13 DIAGNOSIS — F419 Anxiety disorder, unspecified: Secondary | ICD-10-CM | POA: Insufficient documentation

## 2019-11-13 DIAGNOSIS — F900 Attention-deficit hyperactivity disorder, predominantly inattentive type: Secondary | ICD-10-CM | POA: Insufficient documentation

## 2019-11-13 DIAGNOSIS — M1990 Unspecified osteoarthritis, unspecified site: Secondary | ICD-10-CM | POA: Insufficient documentation

## 2019-11-21 ENCOUNTER — Ambulatory Visit: Payer: Self-pay

## 2019-11-21 DIAGNOSIS — M5412 Radiculopathy, cervical region: Secondary | ICD-10-CM

## 2019-11-21 NOTE — Addendum Note (Signed)
Addended by: Annita Brod on: 11/21/2019 03:25 PM   Modules accepted: Orders

## 2019-11-21 NOTE — Addendum Note (Signed)
Addended by: Donne Anon L on: 11/21/2019 03:21 PM   Modules accepted: Orders

## 2019-11-22 ENCOUNTER — Encounter: Payer: 59 | Admitting: Family

## 2019-12-12 NOTE — Patient Instructions (Addendum)
Blood work was ordered.    All other Health Maintenance issues reviewed.   All recommended immunizations and age-appropriate screenings are up-to-date or discussed.  Shingrix immunization administered today.   Medications reviewed and updated.  Changes include : none    Your prescription(s) have been submitted to your pharmacy. Please take as directed and contact our office if you believe you are having problem(s) with the medication(s).    Please followup in 6 months with Dr Okey Dupre.    Health Maintenance, Female Adopting a healthy lifestyle and getting preventive care are important in promoting health and wellness. Ask your health care provider about:  The right schedule for you to have regular tests and exams.  Things you can do on your own to prevent diseases and keep yourself healthy. What should I know about diet, weight, and exercise? Eat a healthy diet   Eat a diet that includes plenty of vegetables, fruits, low-fat dairy products, and lean protein.  Do not eat a lot of foods that are high in solid fats, added sugars, or sodium. Maintain a healthy weight Body mass index (BMI) is used to identify weight problems. It estimates body fat based on height and weight. Your health care provider can help determine your BMI and help you achieve or maintain a healthy weight. Get regular exercise Get regular exercise. This is one of the most important things you can do for your health. Most adults should:  Exercise for at least 150 minutes each week. The exercise should increase your heart rate and make you sweat (moderate-intensity exercise).  Do strengthening exercises at least twice a week. This is in addition to the moderate-intensity exercise.  Spend less time sitting. Even light physical activity can be beneficial. Watch cholesterol and blood lipids Have your blood tested for lipids and cholesterol at 56 years of age, then have this test every 5 years. Have your  cholesterol levels checked more often if:  Your lipid or cholesterol levels are high.  You are older than 56 years of age.  You are at high risk for heart disease. What should I know about cancer screening? Depending on your health history and family history, you may need to have cancer screening at various ages. This may include screening for:  Breast cancer.  Cervical cancer.  Colorectal cancer.  Skin cancer.  Lung cancer. What should I know about heart disease, diabetes, and high blood pressure? Blood pressure and heart disease  High blood pressure causes heart disease and increases the risk of stroke. This is more likely to develop in people who have high blood pressure readings, are of African descent, or are overweight.  Have your blood pressure checked: ? Every 3-5 years if you are 56 years of age. ? Every year if you are 56 years old or older. Diabetes Have regular diabetes screenings. This checks your fasting blood sugar level. Have the screening done:  Once every three years after age 18 if you are at a normal weight and have a low risk for diabetes.  More often and at a younger age if you are overweight or have a high risk for diabetes. What should I know about preventing infection? Hepatitis B If you have a higher risk for hepatitis B, you should be screened for this virus. Talk with your health care provider to find out if you are at risk for hepatitis B infection. Hepatitis C Testing is recommended for:  Everyone born from [birth year] through 1967.  Anyone with known risk  factors for hepatitis C. Sexually transmitted infections (STIs)  Get screened for STIs, including gonorrhea and chlamydia, if: ? You are sexually active and are younger than 56 years of age. ? You are older than 56 years of age and your health care provider tells you that you are at risk for this type of infection. ? Your sexual activity has changed since you were last screened, and you are  at increased risk for chlamydia or gonorrhea. Ask your health care provider if you are at risk.  Ask your health care provider about whether you are at high risk for HIV. Your health care provider may recommend a prescription medicine to help prevent HIV infection. If you choose to take medicine to prevent HIV, you should first get tested for HIV. You should then be tested every 3 months for as long as you are taking the medicine. Pregnancy  If you are about to stop having your period (premenopausal) and you may become pregnant, seek counseling before you get pregnant.  Take 400 to 800 micrograms (mcg) of folic acid every day if you become pregnant.  Ask for birth control (contraception) if you want to prevent pregnancy. Osteoporosis and menopause Osteoporosis is a disease in which the bones lose minerals and strength with aging. This can result in bone fractures. If you are 38 years old or older, or if you are at risk for osteoporosis and fractures, ask your health care provider if you should:  Be screened for bone loss.  Take a calcium or vitamin D supplement to lower your risk of fractures.  Be given hormone replacement therapy (HRT) to treat symptoms of menopause. Follow these instructions at home: Lifestyle  Do not use any products that contain nicotine or tobacco, such as cigarettes, e-cigarettes, and chewing tobacco. If you need help quitting, ask your health care provider.  Do not use street drugs.  Do not share needles.  Ask your health care provider for help if you need support or information about quitting drugs. Alcohol use  Do not drink alcohol if: ? Your health care provider tells you not to drink. ? You are pregnant, may be pregnant, or are planning to become pregnant.  If you drink alcohol: ? Limit how much you use to 0-1 drink a day. ? Limit intake if you are breastfeeding.  Be aware of how much alcohol is in your drink. In the [place] one drink equals one 12 oz  bottle of beer (355 mL), one 5 oz glass of wine (148 mL), or one 1 oz glass of hard liquor (44 mL). General instructions  Schedule regular health, dental, and eye exams.  Stay current with your vaccines.  Tell your health care provider if: ? You often feel depressed. ? You have ever been abused or do not feel safe at home. Summary  Adopting a healthy lifestyle and getting preventive care are important in promoting health and wellness.  Follow your health care provider's instructions about healthy diet, exercising, and getting tested or screened for diseases.  Follow your health care provider's instructions on monitoring your cholesterol and blood pressure. This information is not intended to replace advice given to you by your health care provider. Make sure you discuss any questions you have with your health care provider. Document Revised: 03/14/2018 Document Reviewed: 03/14/2018 Elsevier Patient Education  2020 [employer].

## 2019-12-12 NOTE — Progress Notes (Signed)
Subjective:    Patient ID: Patricia Arroyo, female    DOB: 28-Dec-1965, 54 y.o.   MRN: [id number]  HPI She is here for a physical exam.   She lost about 30 lbs over the past year or so.  She is doing a low carb/keto diet.    Medications and allergies reviewed with patient and updated if appropriate.  Patient Active Problem List   Diagnosis Date Noted  . Left wrist pain 01/10/2019  . Radiculitis of left cervical region 08/17/2017  . Routine general medical examination at a health care facility 05/20/2016  . Ganglion cyst of wrist 09/03/2015  . Primary osteoarthritis of left knee 05/27/2015  . Nocturnal leg cramps 02/06/2015  . Diabetes type 2, controlled (HCC) 01/13/2015  . Fatty liver disease, nonalcoholic 01/13/2015  . Essential hypertension 04/20/2014  . IBS (irritable bowel syndrome) 04/20/2014  . GERD (gastroesophageal reflux disease) 04/20/2014  . Obesity 04/20/2014  . Varicose veins without complication 02/03/2014    Current Outpatient Medications on File Prior to Visit  Medication Sig Dispense Refill  . amphetamine-dextroamphetamine (ADDERALL XR) 15 MG 24 hr capsule Take 30 mg by mouth.     . clotrimazole-betamethasone (LOTRISONE) cream Apply 1 application topically 2 (two) times daily. 30 g 0  . diphenoxylate-atropine (LOMOTIL) 2.5-0.025 MG tablet Take 4 tablets by mouth 4 (four) times daily as needed for diarrhea or loose stools. 180 tablet 1  . estradiol (ESTRACE) 2 MG tablet Take 1 tablet (2 mg total) by mouth daily. 30 tablet 12  . etodolac (LODINE XL) 500 MG 24 hr tablet Take 1 tablet (500 mg total) by mouth daily. 90 tablet 3  . ketoconazole (NIZORAL) 2 % cream Apply 1 application topically daily. 80 g 0  . lisinopril (ZESTRIL) 10 MG tablet TAKE 1 TABLET BY MOUTH EVERY DAY 90 tablet 3  . magnesium oxide (MAG-OX) 400 MG tablet TAKE 2 TABLETS AT BEDTIME 90 tablet 3  . omeprazole (PRILOSEC) 40 MG capsule TAKE 1 CAPSULE BY MOUTH EVERY DAY 90 capsule 2  .  triamterene-hydrochlorothiazide (MAXZIDE-25) 37.5-25 MG tablet Take 1 tablet by mouth daily. 90 tablet 3  . zolpidem (AMBIEN) 10 MG tablet Take 10 mg by mouth at bedtime as needed. sleep      No current facility-administered medications on file prior to visit.    Past Medical History:  Diagnosis Date  . ADHD (attention deficit hyperactivity disorder)   . Anxiety   . Carpal tunnel syndrome   . Depression    h/o---no meds now  . Diabetes mellitus without complication (HCC)   . GERD (gastroesophageal reflux disease)   . Hypertension   . IBS (irritable bowel syndrome)   . Varicose veins     Past Surgical History:  Procedure Laterality Date  . ABDOMINOPLASTY  2020  . ANTERIOR AND POSTERIOR REPAIR  03/10/2011   Procedure: ANTERIOR (CYSTOCELE) AND POSTERIOR REPAIR (RECTOCELE);  Surgeon: Turner Daniels, MD;  Location: [facility];  Service: Gynecology;  Laterality: N/A;  with Sacrospinous Ligament Suspension  . BREAST REDUCTION SURGERY Bilateral 2020  . CARPAL TUNNEL RELEASE Bilateral 04/01/2013   Procedure: BILATERAL CARPAL TUNNEL RELEASE;  Surgeon: Tami Ribas, MD;  Location: [facility];  Service: Orthopedics;  Laterality: Bilateral;  . COLONOSCOPY    . DILATION AND CURETTAGE OF UTERUS    . FOOT FASCIOTOMY  2005   both feet  . LAPAROSCOPIC ASSISTED VAGINAL HYSTERECTOMY  03/10/2011   Procedure: LAPAROSCOPIC ASSISTED VAGINAL HYSTERECTOMY;  Surgeon: Dineen Kid  Rana Snare, MD;  Location: [facility];  Service: Gynecology;  Laterality: N/A;  . NOVASURE ABLATION    . SALPINGOOPHORECTOMY  03/10/2011   Procedure: SALPINGO OOPHERECTOMY;  Surgeon: Turner Daniels, MD;  Location: [facility];  Service: Gynecology;  Laterality: Bilateral;  . WISDOM TOOTH EXTRACTION      Social History   Socioeconomic History  . Marital status: Married    Spouse name: Not on file  . Number of children: Not on file  . Years of education: Not on file  . Highest education level: Not on file  Occupational History  . Not  on file  Tobacco Use  . Smoking status: Never Smoker  . Smokeless tobacco: Never Used  Substance and Sexual Activity  . Alcohol use: No    Alcohol/week: 0.0 standard drinks    Comment: occasionally  . Drug use: No  . Sexual activity: Not on file  Other Topics Concern  . Not on file  Social History Narrative  . Not on file   Social Determinants of Health   Financial Resource Strain:   . Difficulty of Paying Living Expenses: Not on file  Food Insecurity:   . Worried About Programme researcher, broadcasting/film/video in the Last Year: Not on file  . Ran Out of Food in the Last Year: Not on file  Transportation Needs:   . Lack of Transportation (Medical): Not on file  . Lack of Transportation (Non-Medical): Not on file  Physical Activity:   . Days of Exercise per Week: Not on file  . Minutes of Exercise per Session: Not on file  Stress:   . Feeling of Stress : Not on file  Social Connections:   . Frequency of Communication with Friends and Family: Not on file  . Frequency of Social Gatherings with Friends and Family: Not on file  . Attends Religious Services: Not on file  . Active Member of Clubs or Organizations: Not on file  . Attends Banker Meetings: Not on file  . Marital Status: Not on file    Family History  Problem Relation Age of Onset  . Cancer Mother   . Diabetes Father   . Cancer Sister     Review of Systems  Constitutional: Negative for chills and fever.  Eyes: Negative for visual disturbance.  Respiratory: Negative for cough, shortness of breath and wheezing.   Cardiovascular: Negative for chest pain, palpitations and leg swelling.  Gastrointestinal: Negative for abdominal pain, blood in stool, constipation, diarrhea and nausea.       Occ gerd  Genitourinary: Negative for dysuria and hematuria.  Musculoskeletal: Positive for neck pain. Negative for arthralgias.  Skin: Negative for rash.  Neurological: Negative for light-headedness and headaches.    Psychiatric/Behavioral: Positive for dysphoric mood (controlled). The patient is not nervous/anxious.        Objective:   Vitals:   12/13/19 1310  BP: 132/72  Pulse: 95  Temp: 98.3 F (36.8 C)  SpO2: 96%   Filed Weights   12/13/19 1310  Weight: 137 lb (62.1 kg)   Body mass index is 24.27 kg/m.  BP Readings from Last 3 Encounters:  12/13/19 132/72  02/24/19 (!) 118/94  02/21/19 (!) 145/81    Wt Readings from Last 3 Encounters:  12/13/19 137 lb (62.1 kg)  02/24/19 162 lb (73.5 kg)  02/21/19 167 lb (75.8 kg)     Physical Exam Constitutional: She appears well-developed and well-nourished. No distress.  HENT:  Head: Normocephalic and atraumatic.  Right Ear:  External ear normal. Normal ear canal and TM Left Ear: External ear normal.  Normal ear canal and TM Mouth/Throat: Oropharynx is clear and moist.  Eyes: Conjunctivae and EOM are normal.  Neck: Neck supple. No tracheal deviation present. No thyromegaly present.  No carotid bruit  Cardiovascular: Normal rate, regular rhythm and normal heart sounds.   No murmur heard.  No edema. Pulmonary/Chest: Effort normal and breath sounds normal. No respiratory distress. She has no wheezes. She has no rales.  Breast: deferred   Abdominal: Soft. She exhibits no distension. There is no tenderness.  Lymphadenopathy: She has no cervical adenopathy.  Skin: Skin is warm and dry. She is not diaphoretic.  Psychiatric: She has a normal mood and affect. Her behavior is normal.        Assessment & Plan:   Physical exam: Screening blood work    ordered Immunizations  Advised flu - will get at work, discussed shingrix Colonoscopy  Up to date  Mammogram  Up to date  Gyn  Up to date  Eye exams  Up to date  Exercise  Active but no regular exercise Weight  Has lost 30 lbs over the past year - no longer obese Substance abuse  none  See Problem List for Assessment and Plan of chronic medical problems.   This visit occurred during  the SARS-CoV-2 public health emergency.  Safety protocols were in place, including screening questions prior to the visit, additional usage of staff PPE, and extensive cleaning of exam room while observing appropriate contact time as indicated for disinfecting solutions.

## 2019-12-13 ENCOUNTER — Encounter: Payer: Self-pay | Admitting: Internal Medicine

## 2019-12-13 ENCOUNTER — Ambulatory Visit (INDEPENDENT_AMBULATORY_CARE_PROVIDER_SITE_OTHER): Payer: 59 | Admitting: Internal Medicine

## 2019-12-13 ENCOUNTER — Other Ambulatory Visit: Payer: Self-pay

## 2019-12-13 ENCOUNTER — Encounter: Payer: 59 | Admitting: Family

## 2019-12-13 VITALS — BP 132/72 | HR 95 | Temp 98.3°F | Ht 63.0 in | Wt 137.0 lb

## 2019-12-13 DIAGNOSIS — K219 Gastro-esophageal reflux disease without esophagitis: Secondary | ICD-10-CM | POA: Diagnosis not present

## 2019-12-13 DIAGNOSIS — I10 Essential (primary) hypertension: Secondary | ICD-10-CM | POA: Diagnosis not present

## 2019-12-13 DIAGNOSIS — Z0000 Encounter for general adult medical examination without abnormal findings: Secondary | ICD-10-CM

## 2019-12-13 DIAGNOSIS — E119 Type 2 diabetes mellitus without complications: Secondary | ICD-10-CM

## 2019-12-13 DIAGNOSIS — Z23 Encounter for immunization: Secondary | ICD-10-CM | POA: Diagnosis not present

## 2019-12-13 MED ORDER — LISINOPRIL 10 MG PO TABS
10.0000 mg | ORAL_TABLET | Freq: Every day | ORAL | 3 refills | Status: DC
Start: 2019-12-13 — End: 2021-03-01

## 2019-12-13 MED ORDER — OMEPRAZOLE 40 MG PO CPDR
DELAYED_RELEASE_CAPSULE | ORAL | 1 refills | Status: DC
Start: 2019-12-13 — End: 2020-09-07

## 2019-12-13 MED ORDER — TRIAMTERENE-HCTZ 37.5-25 MG PO TABS
1.0000 | ORAL_TABLET | Freq: Every day | ORAL | 3 refills | Status: DC
Start: 2019-12-13 — End: 2021-01-13

## 2019-12-13 NOTE — Assessment & Plan Note (Signed)
Chronic Controlled Try to taper down to 20 mg daily

## 2019-12-13 NOTE — Assessment & Plan Note (Signed)
Chronic Has lost a significant amount of weight Check a1c Low sugar / carb diet Stressed regular exercise

## 2019-12-13 NOTE — Assessment & Plan Note (Signed)
Chronic BP well controlled Current regimen effective and well tolerated Continue current medications at current doses cmp  

## 2019-12-14 LAB — CBC WITH DIFFERENTIAL/PLATELET
Absolute Monocytes: 462 cells/uL (ref 200–950)
Basophils Absolute: 28 cells/uL (ref 0–200)
Basophils Relative: 0.4 %
Eosinophils Absolute: 138 cells/uL (ref 15–500)
Eosinophils Relative: 2 %
HCT: 43.4 % (ref 35.0–45.0)
Hemoglobin: 14.5 g/dL (ref 11.7–15.5)
Lymphs Abs: 2263 cells/uL (ref 850–3900)
MCH: 31.8 pg (ref 27.0–33.0)
MCHC: 33.4 g/dL (ref 32.0–36.0)
MCV: 95.2 fL (ref 80.0–100.0)
MPV: 10.5 fL (ref 7.5–12.5)
Monocytes Relative: 6.7 %
Neutro Abs: 4009 cells/uL (ref 1500–7800)
Neutrophils Relative %: 58.1 %
Platelets: 186 10*3/uL (ref 140–400)
RBC: 4.56 10*6/uL (ref 3.80–5.10)
RDW: 12.3 % (ref 11.0–15.0)
Total Lymphocyte: 32.8 %
WBC: 6.9 10*3/uL (ref 3.8–10.8)

## 2019-12-14 LAB — COMPREHENSIVE METABOLIC PANEL
AG Ratio: 2 (calc) (ref 1.0–2.5)
ALT: 16 U/L (ref 6–29)
AST: 16 U/L (ref 10–35)
Albumin: 4.5 g/dL (ref 3.6–5.1)
Alkaline phosphatase (APISO): 65 U/L (ref 37–153)
BUN: 14 mg/dL (ref 7–25)
CO2: 31 mmol/L (ref 20–32)
Calcium: 9.7 mg/dL (ref 8.6–10.4)
Chloride: 103 mmol/L (ref 98–110)
Creat: 0.79 mg/dL (ref 0.50–1.05)
Globulin: 2.2 g/dL (calc) (ref 1.9–3.7)
Glucose, Bld: 77 mg/dL (ref 65–99)
Potassium: 4.3 mmol/L (ref 3.5–5.3)
Sodium: 143 mmol/L (ref 135–146)
Total Bilirubin: 0.6 mg/dL (ref 0.2–1.2)
Total Protein: 6.7 g/dL (ref 6.1–8.1)

## 2019-12-14 LAB — LIPID PANEL
Cholesterol: 238 mg/dL — ABNORMAL HIGH (ref <200)
HDL: 80 mg/dL (ref >=50)
LDL Cholesterol (Calc): 140 mg/dL (calc) — ABNORMAL HIGH
Non-HDL Cholesterol (Calc): 158 mg/dL (calc) — ABNORMAL HIGH (ref <130)
Total CHOL/HDL Ratio: 3 (calc) (ref <5.0)
Triglycerides: 84 mg/dL (ref <150)

## 2019-12-14 LAB — HEMOGLOBIN A1C
Hgb A1c MFr Bld: 5.4 % of total Hgb (ref <5.7)
Mean Plasma Glucose: 108 (calc)
eAG (mmol/L): 6 (calc)

## 2019-12-14 LAB — TSH: TSH: 0.86 mIU/L (ref 0.40–4.50)

## 2019-12-16 ENCOUNTER — Encounter: Payer: Self-pay | Admitting: Internal Medicine

## 2019-12-31 ENCOUNTER — Other Ambulatory Visit: Payer: Self-pay

## 2019-12-31 ENCOUNTER — Encounter: Payer: Self-pay | Admitting: Sports Medicine

## 2019-12-31 ENCOUNTER — Ambulatory Visit (INDEPENDENT_AMBULATORY_CARE_PROVIDER_SITE_OTHER): Payer: 59 | Admitting: Sports Medicine

## 2019-12-31 DIAGNOSIS — M79602 Pain in left arm: Secondary | ICD-10-CM

## 2019-12-31 DIAGNOSIS — M5412 Radiculopathy, cervical region: Secondary | ICD-10-CM | POA: Diagnosis not present

## 2019-12-31 MED ORDER — GABAPENTIN 300 MG PO CAPS: ORAL_CAPSULE | ORAL | 3 refills | Status: DC

## 2019-12-31 NOTE — Progress Notes (Signed)
    Procedures performed today:    None.  Independent interpretation of notes and tests performed by another provider:   None.  Brief History, Exam, Impression, and Recommendations:    Left arm pain Patricia Arroyo, she is a 54 year old female, she continues to have abnormal sensations and pain coming down from her neck down to her hand. She did have a cervical spine MRI that did not show any areas of neuroforaminal stenosis, certainly there could be a brachioplexopathy or stenosis further down, rather than proceed with nerve conduction and EMG we are going to simply try and block this with gabapentin. If insufficient improvement with a up taper of gabapentin we will then consider nerve conduction/EMG. There is certainly an element of fibromyalgia here as well.    ___________________________________________ [name]. Benjamin Stain, M.D., ABFM., CAQSM. Primary Care and Sports Medicine [facility]  Adjunct Instructor of Family Medicine  [employer]

## 2019-12-31 NOTE — Assessment & Plan Note (Signed)
Patricia Arroyo returns, she is a 54 year old female, she continues to have abnormal sensations and pain coming down from her neck down to her hand. She did have a cervical spine MRI that did not show any areas of neuroforaminal stenosis, certainly there could be a brachioplexopathy or stenosis further down, rather than proceed with nerve conduction and EMG we are going to simply try and block this with gabapentin. If insufficient improvement with a up taper of gabapentin we will then consider nerve conduction/EMG. There is certainly an element of fibromyalgia here as well.

## 2020-01-01 ENCOUNTER — Ambulatory Visit: Payer: 59 | Admitting: Sports Medicine

## 2020-01-10 ENCOUNTER — Ambulatory Visit: Payer: 59 | Admitting: Sports Medicine

## 2020-01-10 DIAGNOSIS — M79602 Pain in left arm: Secondary | ICD-10-CM | POA: Diagnosis not present

## 2020-01-10 NOTE — Progress Notes (Signed)
    Procedures performed today:    None.  Independent interpretation of notes and tests performed by another provider:   None.  Brief History, Exam, Impression, and Recommendations:    Left arm pain Patricia Arroyo returns a bit early, she is a 54 year old female with abnormal sensations and pain coming down from her neck to her hand. She had a cervical spine MRI that really did not show any areas of neuroforaminal stenosis. Certainly a brachial plexopathy could be a possibility or other compressive neuropathy, we did not proceed immediately with nerve conduction study, but added gabapentin, she is only taking it at night, and comes back early.  She was requesting trigger point injections though I have advised her that we should really give gabapentin the due diligence before performing an invasive procedure. She will go up to 3 times daily and come back to see me in the next 2 to 3 weeks at which point we can get a nerve conduction study if no better. If she is having excessive sedation or adverse effects with gabapentin we can certainly try a switch to Lyrica. There may certainly be an element of myofascial pain syndrome here.    ___________________________________________ [name]. Benjamin Stain, M.D., ABFM., CAQSM. Primary Care and Sports Medicine [facility]  Adjunct Instructor of Family Medicine  [employer]

## 2020-01-10 NOTE — Assessment & Plan Note (Signed)
Intisar returns a bit early, she is a 54 year old female with abnormal sensations and pain coming down from her neck to her hand. She had a cervical spine MRI that really did not show any areas of neuroforaminal stenosis. Certainly a brachial plexopathy could be a possibility or other compressive neuropathy, we did not proceed immediately with nerve conduction study, but added gabapentin, she is only taking it at night, and comes back early.  She was requesting trigger point injections though I have advised her that we should really give gabapentin the due diligence before performing an invasive procedure. She will go up to 3 times daily and come back to see me in the next 2 to 3 weeks at which point we can get a nerve conduction study if no better. If she is having excessive sedation or adverse effects with gabapentin we can certainly try a switch to Lyrica. There may certainly be an element of myofascial pain syndrome here.

## 2020-02-14 ENCOUNTER — Telehealth: Payer: Self-pay | Admitting: Internal Medicine

## 2020-02-14 NOTE — Telephone Encounter (Signed)
Patient is calling stating that she needs [place] to call the following doctor to get her in sooner. She states that they told her if we called they could work her in sooner than May 01, 2020.   [phone] - Dr.Ronold Davis @[facility]

## 2020-02-17 NOTE — Telephone Encounter (Signed)
I don't see that we referred her there and I do not know reason she is seeing them.

## 2020-02-19 NOTE — Telephone Encounter (Signed)
Called pt to inform her of message from Dr. But got no answer and unable to leave VM.

## 2020-02-21 ENCOUNTER — Ambulatory Visit (INDEPENDENT_AMBULATORY_CARE_PROVIDER_SITE_OTHER): Payer: 59 | Admitting: Sports Medicine

## 2020-02-21 ENCOUNTER — Ambulatory Visit (INDEPENDENT_AMBULATORY_CARE_PROVIDER_SITE_OTHER): Payer: 59

## 2020-02-21 ENCOUNTER — Other Ambulatory Visit: Payer: Self-pay

## 2020-02-21 DIAGNOSIS — M79602 Pain in left arm: Secondary | ICD-10-CM | POA: Diagnosis not present

## 2020-02-21 DIAGNOSIS — M25532 Pain in left wrist: Secondary | ICD-10-CM

## 2020-02-21 DIAGNOSIS — M25512 Pain in left shoulder: Secondary | ICD-10-CM

## 2020-02-21 NOTE — Assessment & Plan Note (Signed)
Patricia Arroyo is a 54 year old female, abnormal sensations going down her neck to her hand, cervical spine MRI was essentially normal, brachioplexopathy was a possibility, she has responded well to gabapentin, I did suggest that there is likely an element of fibromyalgia. She will having some left shoulder pain, we have treated this for greater than 6 weeks with conservative treatment so I am going to proceed with an MRI of her shoulder, in addition she is having left wrist pain as well, without carpal tunnel symptoms, so adding a left wrist MRI, we need to fully rule out anatomical processes before really blaming this on fibromyalgia, continue gabapentin for now.

## 2020-02-21 NOTE — Progress Notes (Signed)
    Procedures performed today:    None.  Independent interpretation of notes and tests performed by another provider:   None.  Brief History, Exam, Impression, and Recommendations:    Left arm pain Patricia Arroyo is a 54 year old female, abnormal sensations going down her neck to her hand, cervical spine MRI was essentially normal, brachioplexopathy was a possibility, she has responded well to gabapentin, I did suggest that there is likely an element of fibromyalgia. She will having some left shoulder pain, we have treated this for greater than 6 weeks with conservative treatment so I am going to proceed with an MRI of her shoulder, in addition she is having left wrist pain as well, without carpal tunnel symptoms, so adding a left wrist MRI, we need to fully rule out anatomical processes before really blaming this on fibromyalgia, continue gabapentin for now.    ___________________________________________ [name]. Benjamin Stain, M.D., ABFM., CAQSM. Primary Care and Sports Medicine [facility]  Adjunct Instructor of Family Medicine  [employer]

## 2020-03-12 ENCOUNTER — Encounter: Payer: Self-pay | Admitting: Family Medicine

## 2020-03-12 ENCOUNTER — Ambulatory Visit (INDEPENDENT_AMBULATORY_CARE_PROVIDER_SITE_OTHER): Payer: 59 | Admitting: Family Medicine

## 2020-03-12 DIAGNOSIS — I10 Essential (primary) hypertension: Secondary | ICD-10-CM | POA: Diagnosis not present

## 2020-03-12 DIAGNOSIS — M79602 Pain in left arm: Secondary | ICD-10-CM

## 2020-03-12 DIAGNOSIS — F419 Anxiety disorder, unspecified: Secondary | ICD-10-CM

## 2020-03-12 DIAGNOSIS — F900 Attention-deficit hyperactivity disorder, predominantly inattentive type: Secondary | ICD-10-CM

## 2020-03-12 DIAGNOSIS — E119 Type 2 diabetes mellitus without complications: Secondary | ICD-10-CM

## 2020-03-12 DIAGNOSIS — K760 Fatty (change of) liver, not elsewhere classified: Secondary | ICD-10-CM | POA: Diagnosis not present

## 2020-03-12 NOTE — Patient Instructions (Signed)
Very nice to meet you today! Let's plan to follow up in about 6 months and we'll update labs at this visit.  Please call or message through mychart if you have any concerns in the meantime.

## 2020-03-12 NOTE — Progress Notes (Signed)
Patricia Arroyo - 54 y.o. female MRN [id number]  Date of birth: [birth year]  Subjective Chief Complaint  Patient presents with  . Establish Care    HPI Patricia Arroyo is a 54 y.o. female here today for initial visit.  She has a history of hypertension, hyperlipidemia, type 2 diabetes, depression with anxiety and ADHD.  She does see a mental health provider for management of her antidepressant/antianxiety medications as well as ADHD medication.  She reports she is doing well with these medications.  Hypertension is currently managed with Maxide and lisinopril 10 mg.  She is doing well with this at this time.  She denies side effects including symptoms of hypotension.  She has not had any chest pain, shortness of breath, palpitations, headache or vision changes.  She is seeing Dr. Benjamin Stain for management of arthritis and cervical radiculopathy.  Currently on gabapentin which has provided some relief.  She had a lipid panel completed a few months ago showing hyperlipidemia.  She is not currently on any medication for management of this.  Her current ASCVD risk is approximately 5.4%.  ROS:  A comprehensive ROS was completed and negative except as noted per HPI   Allergies  Allergen Reactions  . Adhesive [Tape] Hives and Other (See Comments)    Pt states that she is allergic to adhesive on Band-Aids.  . Banana     Burning sensation on tongue  . Diflucan [Fluconazole] Hives  . Other Other (See Comments) and Hives    Pt states that she is allergic to adhesive on Band-Aids.  . Penicillins Rash  . Sulfa Antibiotics Rash    Past Medical History:  Diagnosis Date  . ADHD (attention deficit hyperactivity disorder)   . Anxiety   . Carpal tunnel syndrome   . Depression    h/o---no meds now  . Diabetes mellitus without complication (HCC)   . GERD (gastroesophageal reflux disease)   . Hypertension   . IBS (irritable bowel syndrome)   . Varicose veins     Past Surgical History:  Procedure  Laterality Date  . ABDOMINOPLASTY  2020  . ANTERIOR AND POSTERIOR REPAIR  03/10/2011   Procedure: ANTERIOR (CYSTOCELE) AND POSTERIOR REPAIR (RECTOCELE);  Surgeon: Turner Daniels, MD;  Location: [facility];  Service: Gynecology;  Laterality: N/A;  with Sacrospinous Ligament Suspension  . BREAST REDUCTION SURGERY Bilateral 2020  . CARPAL TUNNEL RELEASE Bilateral 04/01/2013   Procedure: BILATERAL CARPAL TUNNEL RELEASE;  Surgeon: Tami Ribas, MD;  Location: [facility];  Service: Orthopedics;  Laterality: Bilateral;  . COLONOSCOPY    . DILATION AND CURETTAGE OF UTERUS    . FOOT FASCIOTOMY  2005   both feet  . LAPAROSCOPIC ASSISTED VAGINAL HYSTERECTOMY  03/10/2011   Procedure: LAPAROSCOPIC ASSISTED VAGINAL HYSTERECTOMY;  Surgeon: Turner Daniels, MD;  Location: [facility];  Service: Gynecology;  Laterality: N/A;  . NOVASURE ABLATION    . SALPINGOOPHORECTOMY  03/10/2011   Procedure: SALPINGO OOPHERECTOMY;  Surgeon: Turner Daniels, MD;  Location: [facility];  Service: Gynecology;  Laterality: Bilateral;  . WISDOM TOOTH EXTRACTION      Social History   Socioeconomic History  . Marital status: Married    Spouse name: Not on file  . Number of children: Not on file  . Years of education: Not on file  . Highest education level: Not on file  Occupational History  . Not on file  Tobacco Use  . Smoking status: Never Smoker  . Smokeless tobacco: Never  Used  Vaping Use  . Vaping Use: Never used  Substance and Sexual Activity  . Alcohol use: No    Alcohol/week: 0.0 standard drinks    Comment: occasionally  . Drug use: No  . Sexual activity: Yes    Partners: Male    Birth control/protection: Post-menopausal  Other Topics Concern  . Not on file  Social History Narrative  . Not on file   Social Determinants of Health   Financial Resource Strain: Not on file  Food Insecurity: Not on file  Transportation Needs: Not on file  Physical Activity: Not on file  Stress: Not on file  Social  Connections: Not on file    Family History  Problem Relation Age of Onset  . Cancer Mother   . Hypertension Mother   . Stroke Mother   . Ovarian cancer Mother   . Diabetes Father   . Heart attack Father   . Stroke Father   . Diabetes Sister   . Ovarian cancer Sister     Health Maintenance  Topic Date Due  . OPHTHALMOLOGY EXAM  Never done  . FOOT EXAM  11/05/2019  . MAMMOGRAM  02/27/2020  . COVID-19 Vaccine (2 - Inadvertent risk series with booster) 03/28/2020 (Originally 08/07/2019)  . INFLUENZA VACCINE  07/02/2020 (Originally 11/03/2019)  . HEMOGLOBIN A1C  06/11/2020  . COLONOSCOPY  01/17/2024  . TETANUS/TDAP  10/05/2027  . PNEUMOCOCCAL POLYSACCHARIDE VACCINE AGE 66-64 HIGH RISK  Completed  . Hepatitis C Screening  Completed  . HIV Screening  Completed     ----------------------------------------------------------------------------------------------------------------------------------------------------------------------------------------------------------------- Physical Exam BP 136/84 (BP Location: Left Arm, Patient Position: Sitting, Cuff Size: Normal)   Pulse 88   Temp 98 F (36.7 C)   Ht 5\' 2"  (1.575 m)   Wt 140 lb 12.8 oz (63.9 kg)   SpO2 100%   BMI 25.75 kg/m   Physical Exam Constitutional:      Appearance: Normal appearance.  HENT:     Head: Normocephalic and atraumatic.  Eyes:     General: No scleral icterus. Neurological:     General: No focal deficit present.     Mental Status: She is alert.  Psychiatric:        Mood and Affect: Mood normal.        Behavior: Behavior normal.     ------------------------------------------------------------------------------------------------------------------------------------------------------------------------------------------------------------------- Assessment and Plan  Essential hypertension Data blood pressure remains well controlled at this time.  She will continue Maxide and lisinopril at current doses.   She is reminded to follow a low-sodium diet.  We will plan to repeat her lab work at her next visit in about 6 months.  Fatty liver disease, nonalcoholic She does have history of fatty liver disease however most recent LFTs were normal.  She has also significant amount of weight which I suspect has contributed to improvement of this.  Diabetes type 2, controlled (HCC) She has been able to lose a significant amount of weight.  Last A1c 5.4%.  She is not currently on any medications for management of her diabetes.  Anxiety This is managed by outside mental health provider.  Stable at this time.  Attention deficit hyperactivity disorder, predominantly inattentive type Her ADHD is also managed by her mental health provider.  She is doing well at this time with current medication.  Left arm pain She is having some radicular symptoms but is being worked up and managed by Dr. .  She may have an element of fibromyalgia as well.  She will continue on  gabapentin.  If her symptoms persist we can consider addition of Cymbalta.   No orders of the defined types were placed in this encounter.   Return in about 6 months (around 09/10/2020) for F/u Lipids/HTN.    This visit occurred during the SARS-CoV-2 public health emergency.  Safety protocols were in place, including screening questions prior to the visit, additional usage of staff PPE, and extensive cleaning of exam room while observing appropriate contact time as indicated for disinfecting solutions.

## 2020-03-15 NOTE — Assessment & Plan Note (Signed)
She does have history of fatty liver disease however most recent LFTs were normal.  She has also significant amount of weight which I suspect has contributed to improvement of this.

## 2020-03-15 NOTE — Assessment & Plan Note (Signed)
Her ADHD is also managed by her mental health provider.  She is doing well at this time with current medication.

## 2020-03-15 NOTE — Assessment & Plan Note (Signed)
She has been able to lose a significant amount of weight.  Last A1c 5.4%.  She is not currently on any medications for management of her diabetes.

## 2020-03-15 NOTE — Assessment & Plan Note (Signed)
Data blood pressure remains well controlled at this time.  She will continue Maxide and lisinopril at current doses.  She is reminded to follow a low-sodium diet.  We will plan to repeat her lab work at her next visit in about 6 months.

## 2020-03-15 NOTE — Assessment & Plan Note (Signed)
She is having some radicular symptoms but is being worked up and managed by Dr. Benjamin Stain.  She may have an element of fibromyalgia as well.  She will continue on gabapentin.  If her symptoms persist we can consider addition of Cymbalta.

## 2020-03-15 NOTE — Assessment & Plan Note (Signed)
This is managed by outside mental health provider.  Stable at this time.

## 2020-03-17 ENCOUNTER — Encounter: Payer: Self-pay | Admitting: Family Medicine

## 2020-03-17 ENCOUNTER — Ambulatory Visit (INDEPENDENT_AMBULATORY_CARE_PROVIDER_SITE_OTHER): Payer: 59 | Admitting: Family Medicine

## 2020-03-17 ENCOUNTER — Ambulatory Visit (INDEPENDENT_AMBULATORY_CARE_PROVIDER_SITE_OTHER): Payer: 59

## 2020-03-17 ENCOUNTER — Other Ambulatory Visit: Payer: Self-pay

## 2020-03-17 DIAGNOSIS — R1030 Lower abdominal pain, unspecified: Secondary | ICD-10-CM

## 2020-03-17 DIAGNOSIS — R109 Unspecified abdominal pain: Secondary | ICD-10-CM

## 2020-03-17 DIAGNOSIS — R3129 Other microscopic hematuria: Secondary | ICD-10-CM | POA: Diagnosis not present

## 2020-03-17 DIAGNOSIS — R102 Pelvic and perineal pain: Secondary | ICD-10-CM

## 2020-03-17 LAB — POCT URINALYSIS DIP (CLINITEK)
Blood, UA: NEGATIVE
Glucose, UA: NEGATIVE mg/dL
Ketones, POC UA: NEGATIVE mg/dL
Leukocytes, UA: NEGATIVE
Nitrite, UA: NEGATIVE
POC PROTEIN,UA: NEGATIVE
Spec Grav, UA: >=1.03 — AB (ref 1.010–1.025)
Urobilinogen, UA: 0.2 E.U./dL
pH, UA: 5.5 (ref 5.0–8.0)

## 2020-03-17 NOTE — Progress Notes (Signed)
Patricia Arroyo - 54 y.o. female MRN [id number]  Date of birth: 12/23/1965  Subjective No chief complaint on file.   HPI Patricia Arroyo is a 54 y.o. female here today with complaint of hematuria and lower abdominal pain.  She has history of recurrent hematuria and had lower abdominal pain a couple of days ago. Pain has improved since onset but still present.  She denies dysuria, frequency or urgency.  She has not had fever, chills.   ROS:  A comprehensive ROS was completed and negative except as noted per HPI  Allergies  Allergen Reactions  . Adhesive [Tape] Hives and Other (See Comments)    Pt states that she is allergic to adhesive on Band-Aids.  . Banana     Burning sensation on tongue  . Diflucan [Fluconazole] Hives  . Other Other (See Comments) and Hives    Pt states that she is allergic to adhesive on Band-Aids.  . Penicillins Rash  . Sulfa Antibiotics Rash    Past Medical History:  Diagnosis Date  . ADHD (attention deficit hyperactivity disorder)   . Anxiety   . Carpal tunnel syndrome   . Depression    h/o---no meds now  . Diabetes mellitus without complication (HCC)   . GERD (gastroesophageal reflux disease)   . Hypertension   . IBS (irritable bowel syndrome)   . Varicose veins     Past Surgical History:  Procedure Laterality Date  . ABDOMINOPLASTY  2020  . ANTERIOR AND POSTERIOR REPAIR  03/10/2011   Procedure: ANTERIOR (CYSTOCELE) AND POSTERIOR REPAIR (RECTOCELE);  Surgeon: Turner Daniels, MD;  Location: [facility];  Service: Gynecology;  Laterality: N/A;  with Sacrospinous Ligament Suspension  . BREAST REDUCTION SURGERY Bilateral 2020  . CARPAL TUNNEL RELEASE Bilateral 04/01/2013   Procedure: BILATERAL CARPAL TUNNEL RELEASE;  Surgeon: Tami Ribas, MD;  Location: [facility];  Service: Orthopedics;  Laterality: Bilateral;  . COLONOSCOPY    . DILATION AND CURETTAGE OF UTERUS    . FOOT FASCIOTOMY  2005   both feet  . LAPAROSCOPIC ASSISTED VAGINAL  HYSTERECTOMY  03/10/2011   Procedure: LAPAROSCOPIC ASSISTED VAGINAL HYSTERECTOMY;  Surgeon: Turner Daniels, MD;  Location: [facility];  Service: Gynecology;  Laterality: N/A;  . NOVASURE ABLATION    . SALPINGOOPHORECTOMY  03/10/2011   Procedure: SALPINGO OOPHERECTOMY;  Surgeon: Turner Daniels, MD;  Location: [facility];  Service: Gynecology;  Laterality: Bilateral;  . WISDOM TOOTH EXTRACTION      Social History   Socioeconomic History  . Marital status: Married    Spouse name: Not on file  . Number of children: Not on file  . Years of education: Not on file  . Highest education level: Not on file  Occupational History  . Not on file  Tobacco Use  . Smoking status: Never Smoker  . Smokeless tobacco: Never Used  Vaping Use  . Vaping Use: Never used  Substance and Sexual Activity  . Alcohol use: No    Alcohol/week: 0.0 standard drinks    Comment: occasionally  . Drug use: No  . Sexual activity: Yes    Partners: Male    Birth control/protection: Post-menopausal  Other Topics Concern  . Not on file  Social History Narrative  . Not on file   Social Determinants of Health   Financial Resource Strain: Not on file  Food Insecurity: Not on file  Transportation Needs: Not on file  Physical Activity: Not on file  Stress: Not on file  Social Connections: Not on file    Family History  Problem Relation Age of Onset  . Cancer Mother   . Hypertension Mother   . Stroke Mother   . Ovarian cancer Mother   . Diabetes Father   . Heart attack Father   . Stroke Father   . Diabetes Sister   . Ovarian cancer Sister     Health Maintenance  Topic Date Due  . OPHTHALMOLOGY EXAM  Never done  . FOOT EXAM  11/05/2019  . MAMMOGRAM  02/27/2020  . COVID-19 Vaccine (2 - Inadvertent risk series with booster) 03/28/2020 (Originally 08/07/2019)  . INFLUENZA VACCINE  07/02/2020 (Originally 11/03/2019)  . HEMOGLOBIN A1C  06/11/2020  . COLONOSCOPY  01/17/2024  . TETANUS/TDAP  10/05/2027  . PNEUMOCOCCAL  POLYSACCHARIDE VACCINE AGE 77-64 HIGH RISK  Completed  . Hepatitis C Screening  Completed  . HIV Screening  Completed     ----------------------------------------------------------------------------------------------------------------------------------------------------------------------------------------------------------------- Physical Exam There were no vitals taken for this visit.  Physical Exam Constitutional:      Appearance: Normal appearance.  HENT:     Head: Normocephalic and atraumatic.  Cardiovascular:     Rate and Rhythm: Normal rate and regular rhythm.  Pulmonary:     Effort: Pulmonary effort is normal.     Breath sounds: Normal breath sounds.  Abdominal:     General: There is no distension.     Tenderness: There is no abdominal tenderness. There is no right CVA tenderness or left CVA tenderness.  Musculoskeletal:     Cervical back: Neck supple.  Neurological:     Mental Status: She is alert.     ------------------------------------------------------------------------------------------------------------------------------------------------------------------------------------------------------------------- Assessment and Plan  Lower abdominal pain Symptoms concerning for possible stone.  UA without blood today.  She does have some bilirubin in her urine, LFT's in September normal.   KUB ordered and provided with strainer for urine.  Discussed proceeding with CT if Xray is inconclusive and symptoms persist.     No orders of the defined types were placed in this encounter.   No follow-ups on file.    This visit occurred during the SARS-CoV-2 public health emergency.  Safety protocols were in place, including screening questions prior to the visit, additional usage of staff PPE, and extensive cleaning of exam room while observing appropriate contact time as indicated for disinfecting solutions.

## 2020-03-17 NOTE — Assessment & Plan Note (Signed)
Symptoms concerning for possible stone.  UA without blood today.  She does have some bilirubin in her urine, LFT's in September normal.   KUB ordered and provided with strainer for urine.  Discussed proceeding with CT if Xray is inconclusive and symptoms persist.

## 2020-03-17 NOTE — Patient Instructions (Signed)
Strain urine for the next 2 days.  Have xray completed.  We'll be in touch with results

## 2020-03-18 ENCOUNTER — Other Ambulatory Visit: Payer: Self-pay | Admitting: Family Medicine

## 2020-03-18 MED ORDER — TAMSULOSIN HCL 0.4 MG PO CAPS: 0.4000 mg | ORAL_CAPSULE | Freq: Every day | ORAL | 0 refills | Status: DC

## 2020-03-19 LAB — URINE CULTURE
MICRO NUMBER:: 11318943
Result:: NO GROWTH
SPECIMEN QUALITY:: ADEQUATE

## 2020-03-24 ENCOUNTER — Other Ambulatory Visit: Payer: Self-pay | Admitting: Sports Medicine

## 2020-03-24 DIAGNOSIS — M5412 Radiculopathy, cervical region: Secondary | ICD-10-CM

## 2020-04-09 ENCOUNTER — Other Ambulatory Visit: Payer: Self-pay | Admitting: Family Medicine

## 2020-04-23 DIAGNOSIS — N201 Calculus of ureter: Secondary | ICD-10-CM | POA: Insufficient documentation

## 2020-04-23 DIAGNOSIS — R310 Gross hematuria: Secondary | ICD-10-CM | POA: Insufficient documentation

## 2020-04-24 ENCOUNTER — Other Ambulatory Visit: Payer: Self-pay | Admitting: Family Medicine

## 2020-04-30 ENCOUNTER — Other Ambulatory Visit: Payer: Self-pay | Admitting: Urology

## 2020-04-30 DIAGNOSIS — N200 Calculus of kidney: Secondary | ICD-10-CM

## 2020-05-04 ENCOUNTER — Other Ambulatory Visit: Payer: Self-pay

## 2020-05-04 ENCOUNTER — Ambulatory Visit (INDEPENDENT_AMBULATORY_CARE_PROVIDER_SITE_OTHER): Payer: 59

## 2020-05-04 DIAGNOSIS — R1030 Lower abdominal pain, unspecified: Secondary | ICD-10-CM | POA: Diagnosis not present

## 2020-05-04 DIAGNOSIS — N200 Calculus of kidney: Secondary | ICD-10-CM

## 2020-05-04 DIAGNOSIS — R319 Hematuria, unspecified: Secondary | ICD-10-CM | POA: Diagnosis not present

## 2020-05-08 ENCOUNTER — Encounter: Payer: Self-pay | Admitting: Family Medicine

## 2020-05-09 ENCOUNTER — Other Ambulatory Visit: Payer: Self-pay

## 2020-05-09 ENCOUNTER — Ambulatory Visit (INDEPENDENT_AMBULATORY_CARE_PROVIDER_SITE_OTHER): Payer: 59

## 2020-05-09 DIAGNOSIS — M67814 Other specified disorders of tendon, left shoulder: Secondary | ICD-10-CM | POA: Diagnosis not present

## 2020-05-09 DIAGNOSIS — M189 Osteoarthritis of first carpometacarpal joint, unspecified: Secondary | ICD-10-CM | POA: Diagnosis not present

## 2020-05-12 ENCOUNTER — Other Ambulatory Visit: Payer: Self-pay | Admitting: Family Medicine

## 2020-05-15 ENCOUNTER — Other Ambulatory Visit: Payer: Self-pay

## 2020-05-15 ENCOUNTER — Ambulatory Visit: Payer: 59 | Admitting: Family Medicine

## 2020-05-15 ENCOUNTER — Encounter: Payer: Self-pay | Admitting: Family Medicine

## 2020-05-15 VITALS — BP 128/80 | HR 94 | Temp 97.9°F | Resp 19 | Ht 62.0 in | Wt 141.0 lb

## 2020-05-15 DIAGNOSIS — K580 Irritable bowel syndrome with diarrhea: Secondary | ICD-10-CM

## 2020-05-15 DIAGNOSIS — I700 Atherosclerosis of aorta: Secondary | ICD-10-CM

## 2020-05-15 DIAGNOSIS — E785 Hyperlipidemia, unspecified: Secondary | ICD-10-CM | POA: Diagnosis not present

## 2020-05-15 DIAGNOSIS — E1169 Type 2 diabetes mellitus with other specified complication: Secondary | ICD-10-CM

## 2020-05-15 NOTE — Assessment & Plan Note (Signed)
Noted on recent CT scan.  Discussed starting statin.  Would like to have additional testing regarding cholesterol levels.  I have ordered NMR lipoprofile.  Additional risk factors well controlled at this time.

## 2020-05-15 NOTE — Progress Notes (Signed)
Patricia Arroyo - 55 y.o. female MRN [id number]  Date of birth: 25-Aug-1965  Subjective Chief Complaint  Patient presents with  . Results    CT results    HPI Patricia Arroyo is a 55 y.o. female here today to discuss findings on recent CT scan of aortic atherosclerosis.  Her urologist ordered a CT scan to evaluate for stone due to abdominal pain and hematuria.  This showed epiploic appendagitis but no stones. Incidentally found to have aortic atherosclerosis.  She is concerned about this finding and wants to discuss how to prevent progression.  She has previous history of T2DM however she was able to keep this well controlled with weight loss and dietary change.  She also has a HLD.  Discussion regarding statin previously but had declined and wanted to control with diet.  She is more willing to consider this now.  She has no prior history of smoking.    She mentions she has not been as diligent with her diet recently and has started to have some loose stools also having increased reflux.  She is due to have updated colonoscopy and will plan to schedule this.    ROS:  A comprehensive ROS was completed and negative except as noted per HPI  Allergies  Allergen Reactions  . Adhesive [Tape] Hives and Other (See Comments)    Pt states that she is allergic to adhesive on Band-Aids.  . Banana     Burning sensation on tongue  . Diflucan [Fluconazole] Hives  . Other Other (See Comments) and Hives    Pt states that she is allergic to adhesive on Band-Aids.  . Penicillins Rash  . Sulfa Antibiotics Rash    Past Medical History:  Diagnosis Date  . ADHD (attention deficit hyperactivity disorder)   . Anxiety   . Carpal tunnel syndrome   . Depression    h/o---no meds now  . Diabetes mellitus without complication (HCC)   . GERD (gastroesophageal reflux disease)   . Hypertension   . IBS (irritable bowel syndrome)   . Varicose veins     Past Surgical History:  Procedure Laterality Date  .  ABDOMINOPLASTY  2020  . ANTERIOR AND POSTERIOR REPAIR  03/10/2011   Procedure: ANTERIOR (CYSTOCELE) AND POSTERIOR REPAIR (RECTOCELE);  Surgeon: Turner Daniels, MD;  Location: [facility];  Service: Gynecology;  Laterality: N/A;  with Sacrospinous Ligament Suspension  . BREAST REDUCTION SURGERY Bilateral 2020  . CARPAL TUNNEL RELEASE Bilateral 04/01/2013   Procedure: BILATERAL CARPAL TUNNEL RELEASE;  Surgeon: Tami Ribas, MD;  Location: [facility];  Service: Orthopedics;  Laterality: Bilateral;  . COLONOSCOPY    . DILATION AND CURETTAGE OF UTERUS    . FOOT FASCIOTOMY  2005   both feet  . LAPAROSCOPIC ASSISTED VAGINAL HYSTERECTOMY  03/10/2011   Procedure: LAPAROSCOPIC ASSISTED VAGINAL HYSTERECTOMY;  Surgeon: Turner Daniels, MD;  Location: [facility];  Service: Gynecology;  Laterality: N/A;  . NOVASURE ABLATION    . SALPINGOOPHORECTOMY  03/10/2011   Procedure: SALPINGO OOPHERECTOMY;  Surgeon: Turner Daniels, MD;  Location: [facility];  Service: Gynecology;  Laterality: Bilateral;  . WISDOM TOOTH EXTRACTION      Social History   Socioeconomic History  . Marital status: Married    Spouse name: Not on file  . Number of children: Not on file  . Years of education: Not on file  . Highest education level: Not on file  Occupational History  . Not on file  Tobacco  Use  . Smoking status: Never Smoker  . Smokeless tobacco: Never Used  Vaping Use  . Vaping Use: Never used  Substance and Sexual Activity  . Alcohol use: No    Alcohol/week: 0.0 standard drinks    Comment: occasionally  . Drug use: No  . Sexual activity: Yes    Partners: Male    Birth control/protection: Post-menopausal  Other Topics Concern  . Not on file  Social History Narrative  . Not on file   Social Determinants of Health   Financial Resource Strain: Not on file  Food Insecurity: Not on file  Transportation Needs: Not on file  Physical Activity: Not on file  Stress: Not on file  Social Connections: Not on file     Family History  Problem Relation Age of Onset  . Cancer Mother   . Hypertension Mother   . Stroke Mother   . Ovarian cancer Mother   . Diabetes Father   . Heart attack Father   . Stroke Father   . Diabetes Sister   . Ovarian cancer Sister     Health Maintenance  Topic Date Due  . INFLUENZA VACCINE  07/02/2020 (Originally 11/03/2019)  . FOOT EXAM  05/15/2021 (Originally 11/05/2019)  . HEMOGLOBIN A1C  06/11/2020  . OPHTHALMOLOGY EXAM  08/02/2020  . MAMMOGRAM  03/04/2022  . COLONOSCOPY (Pts 45-11yrs Insurance coverage will need to be confirmed)  01/17/2024  . TETANUS/TDAP  10/05/2027  . PNEUMOCOCCAL POLYSACCHARIDE VACCINE AGE 31-64 HIGH RISK  Completed  . COVID-19 Vaccine  Completed  . Hepatitis C Screening  Completed  . HIV Screening  Completed     ----------------------------------------------------------------------------------------------------------------------------------------------------------------------------------------------------------------- Physical Exam BP 128/80 (BP Location: Right Arm, Cuff Size: Normal)   Pulse 94   Temp 97.9 F (36.6 C) (Oral)   Resp 19   Ht 5\' 2"  (1.575 m)   Wt 141 lb (64 kg)   SpO2 97%   BMI 25.79 kg/m   Physical Exam Constitutional:      Appearance: Normal appearance.  Eyes:     General: No scleral icterus. Cardiovascular:     Rate and Rhythm: Normal rate and regular rhythm.  Pulmonary:     Effort: Pulmonary effort is normal.     Breath sounds: Normal breath sounds.  Musculoskeletal:     Cervical back: Neck supple.  Neurological:     General: No focal deficit present.     Mental Status: She is alert.  Psychiatric:        Mood and Affect: Mood normal.     ------------------------------------------------------------------------------------------------------------------------------------------------------------------------------------------------------------------- Assessment and Plan  Aortic atherosclerosis  (HCC) Noted on recent CT scan.  Discussed starting statin.  Would like to have additional testing regarding cholesterol levels.  I have ordered NMR lipoprofile.  Additional risk factors well controlled at this time.    IBS (irritable bowel syndrome) Increased loose stool and reflux with changes to diet.  We discussed considering addition of probiotic and doing better with healthy diet as she felt better when doing this.    No orders of the defined types were placed in this encounter.   No follow-ups on file.    This visit occurred during the SARS-CoV-2 public health emergency.  Safety protocols were in place, including screening questions prior to the visit, additional usage of staff PPE, and extensive cleaning of exam room while observing appropriate contact time as indicated for disinfecting solutions.

## 2020-05-15 NOTE — Assessment & Plan Note (Signed)
Increased loose stool and reflux with changes to diet.  We discussed considering addition of probiotic and doing better with healthy diet as she felt better when doing this.

## 2020-05-21 ENCOUNTER — Ambulatory Visit: Payer: 59 | Admitting: Sports Medicine

## 2020-05-22 ENCOUNTER — Ambulatory Visit (INDEPENDENT_AMBULATORY_CARE_PROVIDER_SITE_OTHER): Payer: 59

## 2020-05-22 ENCOUNTER — Other Ambulatory Visit: Payer: Self-pay

## 2020-05-22 ENCOUNTER — Ambulatory Visit: Payer: 59 | Admitting: Sports Medicine

## 2020-05-22 ENCOUNTER — Encounter: Payer: Self-pay | Admitting: Sports Medicine

## 2020-05-22 DIAGNOSIS — M67912 Unspecified disorder of synovium and tendon, left shoulder: Secondary | ICD-10-CM

## 2020-05-22 DIAGNOSIS — M25532 Pain in left wrist: Secondary | ICD-10-CM | POA: Diagnosis not present

## 2020-05-22 NOTE — Assessment & Plan Note (Signed)
MRI confirmed the dominant finding of left rotator cuff tendinosis, we did a subacromial injection today, return to see me in a month.

## 2020-05-22 NOTE — Assessment & Plan Note (Signed)
X-rays did show radiocarpal degenerative changes, scaphotrapeziotrapezoidal changes, MRI confirmed the above, we can proceed with a wrist joint injection at a follow-up visit if need be, she will be very cognizant as to exactly what she where she feels the pain over the next month.

## 2020-05-22 NOTE — Progress Notes (Signed)
    Procedures performed today:    Procedure: Real-time Ultrasound Guided injection of the left subacromial bursa Device: Samsung HS60  Verbal informed consent obtained.  Time-out conducted.  Noted no overlying erythema, induration, or other signs of local infection.  Skin prepped in a sterile fashion.  Local anesthesia: Topical Ethyl chloride.  With sterile technique and under real time ultrasound guidance:  Noted intact rotator cuff, 1 cc Kenalog 40, 1 cc lidocaine, 1 cc bupivacaine injected easily Completed without difficulty  Advised to call if fevers/chills, erythema, induration, drainage, or persistent bleeding.  Images permanently stored and available for review in PACS.  Impression: Technically successful ultrasound guided injection.  Independent interpretation of notes and tests performed by another provider:   None.  Brief History, Exam, Impression, and Recommendations:    Disorder of rotator cuff, left MRI confirmed the dominant finding of left rotator cuff tendinosis, we did a subacromial injection today, return to see me in a month.  Left wrist pain X-rays did show radiocarpal degenerative changes, scaphotrapeziotrapezoidal changes, MRI confirmed the above, we can proceed with a wrist joint injection at a follow-up visit if need be, she will be very cognizant as to exactly what she where she feels the pain over the next month.    ___________________________________________ [name]. Benjamin Stain, M.D., ABFM., CAQSM. Primary Care and Sports Medicine [facility]  Adjunct Instructor of Family Medicine  [employer]

## 2020-05-23 ENCOUNTER — Other Ambulatory Visit: Payer: Self-pay | Admitting: Internal Medicine

## 2020-05-24 ENCOUNTER — Other Ambulatory Visit: Payer: Self-pay | Admitting: Sports Medicine

## 2020-05-24 DIAGNOSIS — M5412 Radiculopathy, cervical region: Secondary | ICD-10-CM

## 2020-05-27 LAB — LIPOPROTEIN FRACTIONATION, NMR W/ LIPID PNL
CHOL/HDL C: 2.9 calc (ref <3.6)
CHOLESTEROL, TOTAL: 223 mg/dL — ABNORMAL HIGH (ref <200)
HDL CHOLESTEROL: 77 mg/dL (ref >49)
HDL P: 47.1 umol/L (ref >32.8)
HDL Size: 9.4 nm (ref >9.0)
LARGE HDL P: 11.3 umol/L (ref >7.2)
LARGE VLDL P: <1.5 nmol/L (ref <3.7)
LDL CHOLESTEROL: 132 mg/dL (calc) — ABNORMAL HIGH (ref <100)
LDL P: 1433 nmol/L — ABNORMAL HIGH (ref <935)
LDL SIZE: 21.8 nm (ref >20.5)
NON HDL CHOLESTEROL: 146 mg/dL (calc) — ABNORMAL HIGH (ref <130)
SMALL LDL P: 179 nmol/L (ref <467)
TG/HDL C: 0.8 calc (ref <2.0)
TRIGLYCERIDES: 58 mg/dL (ref <150)
VLDL Size: 45.7 nm (ref <47.1)

## 2020-06-05 ENCOUNTER — Other Ambulatory Visit: Payer: Self-pay | Admitting: Internal Medicine

## 2020-06-15 ENCOUNTER — Ambulatory Visit: Payer: 59 | Admitting: Family Medicine

## 2020-06-15 ENCOUNTER — Ambulatory Visit: Payer: 59 | Admitting: Sports Medicine

## 2020-06-26 ENCOUNTER — Ambulatory Visit: Payer: 59 | Admitting: Sports Medicine

## 2020-08-24 ENCOUNTER — Ambulatory Visit (INDEPENDENT_AMBULATORY_CARE_PROVIDER_SITE_OTHER): Payer: 59

## 2020-08-24 ENCOUNTER — Other Ambulatory Visit: Payer: Self-pay

## 2020-08-24 ENCOUNTER — Ambulatory Visit: Payer: 59 | Admitting: Sports Medicine

## 2020-08-24 DIAGNOSIS — S99921A Unspecified injury of right foot, initial encounter: Secondary | ICD-10-CM | POA: Diagnosis not present

## 2020-08-24 DIAGNOSIS — M67912 Unspecified disorder of synovium and tendon, left shoulder: Secondary | ICD-10-CM | POA: Diagnosis not present

## 2020-08-24 NOTE — Assessment & Plan Note (Addendum)
MRI confirmed left rotator cuff tendinosis, we did a subacromial injection back in February, she is having a recurrence of discomfort. Today she requested that we do more of a trigger point injection in the left trapezius approximately at the mid scapular spine. This was performed today, if inadequate relief we will proceed with a subacromial injection, she will do some rotator cuff rehab in the meantime. This is exacerbation of a chronic process.

## 2020-08-24 NOTE — Progress Notes (Signed)
    Procedures performed today:    Procedure:  Injection of left trapezial trigger point Consent obtained and verified. Time-out conducted. Noted no overlying erythema, induration, or other signs of local infection. Skin prepped in a sterile fashion. Topical analgesic spray: Ethyl chloride. Completed without difficulty. Meds: 1 cc kenalog 40, 1 cc lidocaine injected easily in a fanlike fashion. Advised to call if fevers/chills, erythema, induration, drainage, or persistent bleeding.  Independent interpretation of notes and tests performed by another provider:   None.  Brief History, Exam, Impression, and Recommendations:    Disorder of rotator cuff, left MRI confirmed left rotator cuff tendinosis, we did a subacromial injection back in February, she is having a recurrence of discomfort. Today she requested that we do more of a trigger point injection in the left trapezius approximately at the mid scapular spine. This was performed today, if inadequate relief we will proceed with a subacromial injection, she will do some rotator cuff rehab in the meantime. This is exacerbation of a chronic process.  Injury of second toe of right foot Accidentally kicked an object about 8 weeks ago, still has some swelling at the right second DIP. Buddy tape the second and third toes together, getting some x-rays.    ___________________________________________ [name]. Benjamin Stain, M.D., ABFM., CAQSM. Primary Care and Sports Medicine [facility]  Adjunct Instructor of Family Medicine  [employer]

## 2020-08-24 NOTE — Assessment & Plan Note (Signed)
Accidentally kicked an object about 8 weeks ago, still has some swelling at the right second DIP. Buddy tape the second and third toes together, getting some x-rays.

## 2020-09-01 ENCOUNTER — Other Ambulatory Visit: Payer: Self-pay | Admitting: Internal Medicine

## 2020-09-04 ENCOUNTER — Other Ambulatory Visit: Payer: Self-pay | Admitting: Family Medicine

## 2020-09-25 ENCOUNTER — Ambulatory Visit: Payer: 59 | Admitting: Sports Medicine

## 2020-12-21 ENCOUNTER — Other Ambulatory Visit: Payer: Self-pay | Admitting: Internal Medicine

## 2020-12-21 NOTE — Telephone Encounter (Signed)
Patient should have refill sent to Dr. Elmyra Ricks office

## 2021-01-13 ENCOUNTER — Other Ambulatory Visit: Payer: Self-pay | Admitting: Family Medicine

## 2021-02-26 ENCOUNTER — Other Ambulatory Visit: Payer: Self-pay | Admitting: Internal Medicine

## 2021-03-03 ENCOUNTER — Other Ambulatory Visit: Payer: Self-pay | Admitting: Family Medicine

## 2021-03-22 ENCOUNTER — Encounter: Payer: Self-pay | Admitting: Sports Medicine

## 2021-03-22 ENCOUNTER — Ambulatory Visit (INDEPENDENT_AMBULATORY_CARE_PROVIDER_SITE_OTHER): Payer: 59

## 2021-03-22 ENCOUNTER — Other Ambulatory Visit: Payer: Self-pay

## 2021-03-22 ENCOUNTER — Ambulatory Visit: Payer: 59 | Admitting: Sports Medicine

## 2021-03-22 DIAGNOSIS — M25461 Effusion, right knee: Secondary | ICD-10-CM

## 2021-03-22 DIAGNOSIS — Z09 Encounter for follow-up examination after completed treatment for conditions other than malignant neoplasm: Secondary | ICD-10-CM | POA: Diagnosis not present

## 2021-03-22 DIAGNOSIS — M5412 Radiculopathy, cervical region: Secondary | ICD-10-CM

## 2021-03-22 DIAGNOSIS — S83206A Unspecified tear of unspecified meniscus, current injury, right knee, initial encounter: Secondary | ICD-10-CM | POA: Insufficient documentation

## 2021-03-22 NOTE — Assessment & Plan Note (Signed)
Twisted, felt a pop, had immediate swelling and pain. Aspiration and injection, x-rays, hinged knee brace. Return to see me in 4 weeks. MRI if not better.

## 2021-03-22 NOTE — Progress Notes (Signed)
° ° °  Procedures performed today:    Procedure: Real-time Ultrasound Guided aspiration/injection of right knee. Device: Samsung HS60  Verbal informed consent obtained.  Time-out conducted.  Noted no overlying erythema, induration, or other signs of local infection.  Skin prepped in a sterile fashion.  Local anesthesia: Topical Ethyl chloride.  With sterile technique and under real time ultrasound guidance: Noted effusion, aspirated 24 mils of clear, straw-colored fluid, syringe switched and 1 cc Kenalog 40, 2 cc lidocaine, 2 cc bupivacaine injected easily Completed without difficulty  Advised to call if fevers/chills, erythema, induration, drainage, or persistent bleeding.  Images permanently stored and available for review in PACS.  Impression: Technically successful ultrasound guided injection.  Independent interpretation of notes and tests performed by another provider:   None.  Brief History, Exam, Impression, and Recommendations:    Effusion of knee joint right Twisted, felt a pop, had immediate swelling and pain. Aspiration and injection, x-rays, hinged knee brace. Return to see me in 4 weeks. MRI if not better.    ___________________________________________ [name]. Benjamin Stain, M.D., ABFM., CAQSM. Primary Care and Sports Medicine [facility]  Adjunct Instructor of Family Medicine  [employer]

## 2021-03-23 ENCOUNTER — Ambulatory Visit: Payer: 59 | Admitting: Family Medicine

## 2021-04-22 ENCOUNTER — Ambulatory Visit (INDEPENDENT_AMBULATORY_CARE_PROVIDER_SITE_OTHER): Payer: 59 | Admitting: Family Medicine

## 2021-04-22 ENCOUNTER — Other Ambulatory Visit: Payer: Self-pay

## 2021-04-22 ENCOUNTER — Encounter: Payer: Self-pay | Admitting: Family Medicine

## 2021-04-22 VITALS — BP 124/78 | HR 91 | Temp 97.7°F | Ht 62.0 in | Wt 153.0 lb

## 2021-04-22 DIAGNOSIS — E119 Type 2 diabetes mellitus without complications: Secondary | ICD-10-CM | POA: Diagnosis not present

## 2021-04-22 DIAGNOSIS — Z0000 Encounter for general adult medical examination without abnormal findings: Secondary | ICD-10-CM

## 2021-04-22 DIAGNOSIS — I10 Essential (primary) hypertension: Secondary | ICD-10-CM | POA: Diagnosis not present

## 2021-04-22 DIAGNOSIS — M5412 Radiculopathy, cervical region: Secondary | ICD-10-CM

## 2021-04-22 MED ORDER — ETODOLAC ER 500 MG PO TB24: 500.0000 mg | ORAL_TABLET | Freq: Every day | ORAL | 3 refills | Status: DC

## 2021-04-22 MED ORDER — GABAPENTIN 300 MG PO CAPS: ORAL_CAPSULE | ORAL | 1 refills | Status: DC

## 2021-04-22 NOTE — Assessment & Plan Note (Signed)
Well adult Orders Placed This Encounter  Procedures   COMPLETE METABOLIC PANEL WITH GFR   CBC with Differential   Lipid Panel w/reflex Direct LDL   TSH   HgB A1c  Given handout for low back stretches to help with low back and buttock pain. Immunizations: Up-to-date Screenings: Up-to-date Anticipatory guidance/risk factor reduction: Recommendations per AVS.

## 2021-04-22 NOTE — Patient Instructions (Signed)

## 2021-04-22 NOTE — Progress Notes (Signed)
Patricia Arroyo - 56 y.o. female MRN [id number]  Date of birth: [birth year]  Subjective Chief Complaint  Patient presents with   Annual Exam    HPI Patricia Arroyo is a 56 year old female here today for annual exam.  Overall she is doing well.  Requesting refills of etodolac and gabapentin.  She has having some increased pain in the left buttock and thigh area.    She is a non-smoker.  She denies alcohol use.  She does exercise occasionally.  She feels like her diet is pretty healthy.  She is up-to-date on mammogram and colonoscopy.  She is status post hysterectomy.  Review of Systems  Constitutional:  Negative for chills, fever, malaise/fatigue and weight loss.  HENT:  Negative for congestion, ear pain and sore throat.   Eyes:  Negative for blurred vision, double vision and pain.  Respiratory:  Negative for cough and shortness of breath.   Cardiovascular:  Negative for chest pain and palpitations.  Gastrointestinal:  Negative for abdominal pain, blood in stool, constipation, heartburn and nausea.  Genitourinary:  Negative for dysuria and urgency.  Musculoskeletal:  Negative for joint pain and myalgias.  Neurological:  Negative for dizziness and headaches.  Endo/Heme/Allergies:  Does not bruise/bleed easily.  Psychiatric/Behavioral:  Negative for depression. The patient is not nervous/anxious and does not have insomnia.    Allergies  Allergen Reactions   Adhesive [Tape] Hives and Other (See Comments)    Pt states that she is allergic to adhesive on Band-Aids.   Banana     Burning sensation on tongue   Diflucan [Fluconazole] Hives   Other Other (See Comments) and Hives    Pt states that she is allergic to adhesive on Band-Aids.   Penicillins Rash   Sulfa Antibiotics Rash    Past Medical History:  Diagnosis Date   ADHD (attention deficit hyperactivity disorder)    Anxiety    Carpal tunnel syndrome    Depression    h/o---no meds now   Diabetes mellitus without complication (HCC)     GERD (gastroesophageal reflux disease)    Hypertension    IBS (irritable bowel syndrome)    Varicose veins     Past Surgical History:  Procedure Laterality Date   ABDOMINOPLASTY  2020   ANTERIOR AND POSTERIOR REPAIR  03/10/2011   Procedure: ANTERIOR (CYSTOCELE) AND POSTERIOR REPAIR (RECTOCELE);  Surgeon: Turner Daniels, MD;  Location: [facility];  Service: Gynecology;  Laterality: N/A;  with Sacrospinous Ligament Suspension   BREAST REDUCTION SURGERY Bilateral 2020   CARPAL TUNNEL RELEASE Bilateral 04/01/2013   Procedure: BILATERAL CARPAL TUNNEL RELEASE;  Surgeon: Tami Ribas, MD;  Location: [facility];  Service: Orthopedics;  Laterality: Bilateral;   COLONOSCOPY     DILATION AND CURETTAGE OF UTERUS     FOOT FASCIOTOMY  2005   both feet   LAPAROSCOPIC ASSISTED VAGINAL HYSTERECTOMY  03/10/2011   Procedure: LAPAROSCOPIC ASSISTED VAGINAL HYSTERECTOMY;  Surgeon: Turner Daniels, MD;  Location: [facility];  Service: Gynecology;  Laterality: N/A;   NOVASURE ABLATION     SALPINGOOPHORECTOMY  03/10/2011   Procedure: SALPINGO OOPHERECTOMY;  Surgeon: Turner Daniels, MD;  Location: [facility];  Service: Gynecology;  Laterality: Bilateral;   WISDOM TOOTH EXTRACTION      Social History   Socioeconomic History   Marital status: Married    Spouse name: Not on file   Number of children: Not on file   Years of education: Not on file   Highest education level: Not  on file  Occupational History   Not on file  Tobacco Use   Smoking status: Never   Smokeless tobacco: Never  Vaping Use   Vaping Use: Never used  Substance and Sexual Activity   Alcohol use: No    Alcohol/week: 0.0 standard drinks    Comment: occasionally   Drug use: No   Sexual activity: Yes    Partners: Male    Birth control/protection: Post-menopausal  Other Topics Concern   Not on file  Social History Narrative   Not on file   Social Determinants of Health   Financial Resource Strain: Not on file  Food Insecurity: Not  on file  Transportation Needs: Not on file  Physical Activity: Not on file  Stress: Not on file  Social Connections: Not on file    Family History  Problem Relation Age of Onset   Cancer Mother    Hypertension Mother    Stroke Mother    Ovarian cancer Mother    Diabetes Father    Heart attack Father    Stroke Father    Diabetes Sister    Ovarian cancer Sister     Health Maintenance  Topic Date Due   Zoster Vaccines- Shingrix (1 of 2) Never done   HEMOGLOBIN A1C  06/11/2020   OPHTHALMOLOGY EXAM  08/02/2020   INFLUENZA VACCINE  11/02/2020   FOOT EXAM  05/15/2021 (Originally 11/05/2019)   MAMMOGRAM  03/04/2022   COLONOSCOPY (Pts 45-4233yrs Insurance coverage will need to be confirmed)  01/17/2024   TETANUS/TDAP  10/05/2027   COVID-19 Vaccine  Completed   Hepatitis C Screening  Completed   HIV Screening  Completed   HPV VACCINES  Aged Out     ----------------------------------------------------------------------------------------------------------------------------------------------------------------------------------------------------------------- Physical Exam BP 124/78 (BP Location: Left Arm, Patient Position: Sitting, Cuff Size: Normal)    Pulse 91    Temp 97.7 F (36.5 C)    Ht 5\' 2"  (1.575 m)    Wt 153 lb (69.4 kg)    SpO2 95%    BMI 27.98 kg/m   Physical Exam Constitutional:      General: She is not in acute distress. HENT:     Head: Normocephalic and atraumatic.     Right Ear: Tympanic membrane and ear canal normal.     Left Ear: Tympanic membrane and ear canal normal.     Nose: Nose normal.  Eyes:     General: No scleral icterus.    Conjunctiva/sclera: Conjunctivae normal.  Neck:     Thyroid: No thyromegaly.  Cardiovascular:     Rate and Rhythm: Normal rate and regular rhythm.     Heart sounds: Normal heart sounds.  Pulmonary:     Effort: Pulmonary effort is normal.     Breath sounds: Normal breath sounds.  Abdominal:     General: Bowel sounds are  normal. There is no distension.     Palpations: Abdomen is soft.     Tenderness: There is no abdominal tenderness. There is no guarding.  Musculoskeletal:        General: Normal range of motion.     Cervical back: Normal range of motion and neck supple.  Lymphadenopathy:     Cervical: No cervical adenopathy.  Skin:    General: Skin is warm and dry.     Findings: No rash.  Neurological:     General: No focal deficit present.     Mental Status: She is alert and oriented to person, place, and time.     Cranial  Nerves: No cranial nerve deficit.     Coordination: Coordination normal.  Psychiatric:        Mood and Affect: Mood normal.        Behavior: Behavior normal.    ------------------------------------------------------------------------------------------------------------------------------------------------------------------------------------------------------------------- Assessment and Plan  Well adult exam Well adult Orders Placed This Encounter  Procedures   COMPLETE METABOLIC PANEL WITH GFR   CBC with Differential   Lipid Panel w/reflex Direct LDL   TSH   HgB A1c  Given handout for low back stretches to help with low back and buttock pain. Immunizations: Up-to-date Screenings: Up-to-date Anticipatory guidance/risk factor reduction: Recommendations per AVS.   Meds ordered this encounter  Medications   gabapentin (NEURONTIN) 300 MG capsule    Sig: ONE TABLET BY MOUTH EVERY NIGHT AT BEDTIME FOR A WEEK, THEN TWICE A DAY FOR A WEEK, THEN THREE TIMES DAILY. MAY DOUBLE WEEKLY TO A MAX OF 3,600MG /DAY    Dispense:  270 capsule    Refill:  1   etodolac (LODINE XL) 500 MG 24 hr tablet    Sig: Take 1 tablet (500 mg total) by mouth daily.    Dispense:  90 tablet    Refill:  3    No follow-ups on file.    This visit occurred during the SARS-CoV-2 public health emergency.  Safety protocols were in place, including screening questions prior to the visit, additional usage  of staff PPE, and extensive cleaning of exam room while observing appropriate contact time as indicated for disinfecting solutions.

## 2021-04-23 ENCOUNTER — Telehealth: Payer: Self-pay

## 2021-04-23 LAB — CBC WITH DIFFERENTIAL/PLATELET
Absolute Monocytes: 409 cells/uL (ref 200–950)
Basophils Absolute: 40 cells/uL (ref 0–200)
Basophils Relative: 0.6 %
Eosinophils Absolute: 0 cells/uL — ABNORMAL LOW (ref 15–500)
Eosinophils Relative: 0 %
HCT: 42.9 % (ref 35.0–45.0)
Hemoglobin: 14.4 g/dL (ref 11.7–15.5)
Lymphs Abs: 1815 cells/uL (ref 850–3900)
MCH: 30.6 pg (ref 27.0–33.0)
MCHC: 33.6 g/dL (ref 32.0–36.0)
MCV: 91.1 fL (ref 80.0–100.0)
MPV: 10.5 fL (ref 7.5–12.5)
Monocytes Relative: 6.2 %
Neutro Abs: 4336 cells/uL (ref 1500–7800)
Neutrophils Relative %: 65.7 %
Platelets: 221 10*3/uL (ref 140–400)
RBC: 4.71 10*6/uL (ref 3.80–5.10)
RDW: 12.4 % (ref 11.0–15.0)
Total Lymphocyte: 27.5 %
WBC: 6.6 10*3/uL (ref 3.8–10.8)

## 2021-04-23 LAB — LIPID PANEL W/REFLEX DIRECT LDL
Cholesterol: 215 mg/dL — ABNORMAL HIGH (ref <200)
HDL: 77 mg/dL (ref >=50)
LDL Cholesterol (Calc): 121 mg/dL (calc) — ABNORMAL HIGH
Non-HDL Cholesterol (Calc): 138 mg/dL (calc) — ABNORMAL HIGH (ref <130)
Total CHOL/HDL Ratio: 2.8 (calc) (ref <5.0)
Triglycerides: 75 mg/dL (ref <150)

## 2021-04-23 LAB — COMPLETE METABOLIC PANEL WITH GFR
AG Ratio: 1.9 (calc) (ref 1.0–2.5)
ALT: 16 U/L (ref 6–29)
AST: 18 U/L (ref 10–35)
Albumin: 4.4 g/dL (ref 3.6–5.1)
Alkaline phosphatase (APISO): 84 U/L (ref 37–153)
BUN: 15 mg/dL (ref 7–25)
CO2: 35 mmol/L — ABNORMAL HIGH (ref 20–32)
Calcium: 9.4 mg/dL (ref 8.6–10.4)
Chloride: 102 mmol/L (ref 98–110)
Creat: 0.76 mg/dL (ref 0.50–1.03)
Globulin: 2.3 g/dL (calc) (ref 1.9–3.7)
Glucose, Bld: 81 mg/dL (ref 65–99)
Potassium: 3.8 mmol/L (ref 3.5–5.3)
Sodium: 141 mmol/L (ref 135–146)
Total Bilirubin: 0.4 mg/dL (ref 0.2–1.2)
Total Protein: 6.7 g/dL (ref 6.1–8.1)
eGFR: 91 mL/min/{1.73_m2} (ref >=60)

## 2021-04-23 LAB — HEMOGLOBIN A1C
Hgb A1c MFr Bld: 5.6 % of total Hgb (ref <5.7)
Mean Plasma Glucose: 114 mg/dL
eAG (mmol/L): 6.3 mmol/L

## 2021-04-23 LAB — TSH: TSH: 0.93 mIU/L (ref 0.40–4.50)

## 2021-04-23 NOTE — Telephone Encounter (Signed)
Faxed Rx, OV notes, and Authorization request to [employer] for compression socks.   Fax: [phone]  Verification received.

## 2021-05-17 ENCOUNTER — Encounter: Payer: Self-pay | Admitting: Family Medicine

## 2021-06-03 ENCOUNTER — Other Ambulatory Visit: Payer: Self-pay

## 2021-06-03 ENCOUNTER — Ambulatory Visit: Payer: 59 | Admitting: Sports Medicine

## 2021-06-03 DIAGNOSIS — M25461 Effusion, right knee: Secondary | ICD-10-CM | POA: Diagnosis not present

## 2021-06-03 DIAGNOSIS — M67912 Unspecified disorder of synovium and tendon, left shoulder: Secondary | ICD-10-CM | POA: Diagnosis not present

## 2021-06-03 NOTE — Progress Notes (Signed)
? ? ?  Procedures performed today:   ? ?None. ? ?Independent interpretation of notes and tests performed by another provider:  ? ?Procedure:  Injection of left trapezial trigger point ?Consent obtained and verified. ?Time-out conducted. ?Noted no overlying erythema, induration, or other signs of local infection. ?Skin prepped in a sterile fashion. ?Topical analgesic spray: Ethyl chloride. ?Completed without difficulty. ?Meds: 1 cc lidocaine, 1 cc kenalog 40 injected easily in a fanlike pattern. ?Advised to call if fevers/chills, erythema, induration, drainage, or persistent bleeding. ? ?Brief History, Exam, Impression, and Recommendations:   ? ?Effusion of knee joint right ?Improved to some degree, she did get an aspiration and injection back in the middle of December 2022. ?Improving with Lodine. ?We will hold off on additional intervention for now, however if this worsens we can proceed with steroid and viscosupplementation, she did well with Visco on the left knee approximately 5 years ago. ? ?Disorder of rotator cuff, left ?MRI confirmed left rotator cuff tendinosis, we did a subacromial injection back in February, she had a recurrence of pain back in May 2022 and we did a trigger point injection left trapezius, this did really well for approximately 10 months. ?Now having recurrence of pain left trapezius, today we performed another trigger point injection, return as needed for this.  There is very likely an element of fibromyalgia here.  Continue gabapentin. ? ? ? ?___________________________________________ ?[name]. Patricia Arroyo, M.D., ABFM., CAQSM. ?Primary Care and Sports Medicine ?[facility] Patricia Arroyo ? ?Adjunct Instructor of Family Medicine  ?[employer] ?

## 2021-06-03 NOTE — Assessment & Plan Note (Signed)
Improved to some degree, she did get an aspiration and injection back in the middle of December 2022. ?Improving with Lodine. ?We will hold off on additional intervention for now, however if this worsens we can proceed with steroid and viscosupplementation, she did well with Visco on the left knee approximately 5 years ago. ?

## 2021-06-03 NOTE — Assessment & Plan Note (Signed)
MRI confirmed left rotator cuff tendinosis, we did a subacromial injection back in February, she had a recurrence of pain back in May 2022 and we did a trigger point injection left trapezius, this did really well for approximately 10 months. ?Now having recurrence of pain left trapezius, today we performed another trigger point injection, return as needed for this.  There is very likely an element of fibromyalgia here.  Continue gabapentin. ?

## 2021-07-21 ENCOUNTER — Telehealth: Payer: Self-pay | Admitting: Family Medicine

## 2021-07-21 NOTE — Telephone Encounter (Signed)
PT called and scheduled for May 1st for her knee pain and states she can't hardly walk on it and wants to know if Dr.T would work her in any sooner and request for [place] to send a message to him and ask. ?

## 2021-07-21 NOTE — Telephone Encounter (Signed)
No but I could send her in some tramadol to hold her over until May 1 if she would like. ?

## 2021-07-21 NOTE — Telephone Encounter (Signed)
Spoke with Patient at 1:24pm and she does not want tramadol. She stated to add her to the wait list and she will wait until May 1 or a slot becomes available. Patient was added to the waitlist.gh ?

## 2021-07-21 NOTE — Telephone Encounter (Signed)
Patient called to get an appointment for injection in right knee. Patient is scheduled for May 1st but wanted to know if Dr. Karie Schwalbe would fit her in sooner. In a lot of pain and hard to walk. No other openings at the time of the call. ?

## 2021-07-23 ENCOUNTER — Ambulatory Visit: Payer: 59 | Admitting: Sports Medicine

## 2021-07-23 ENCOUNTER — Ambulatory Visit (INDEPENDENT_AMBULATORY_CARE_PROVIDER_SITE_OTHER): Payer: 59

## 2021-07-23 DIAGNOSIS — M25461 Effusion, right knee: Secondary | ICD-10-CM | POA: Diagnosis not present

## 2021-07-23 NOTE — Progress Notes (Signed)
? ? ?  Procedures performed today:   ? ?Procedure: Real-time Ultrasound Guided aspiration/injection of right knee ?Device: Samsung HS60  ?Verbal informed consent obtained.  ?Time-out conducted.  ?Noted no overlying erythema, induration, or other signs of local infection.  ?Skin prepped in a sterile fashion.  ?Local anesthesia: Topical Ethyl chloride.  ?With sterile technique and under real time ultrasound guidance: Noted effusion, aspirated 15 mils of clear, straw-colored fluid, syringe switched and 1 cc Kenalog 40, 2 cc lidocaine, 2 cc bupivacaine injected easily ?Completed without difficulty  ?Advised to call if fevers/chills, erythema, induration, drainage, or persistent bleeding.  ?Images permanently stored and available for review in PACS.  ?Impression: Technically successful ultrasound guided injection. ? ?Independent interpretation of notes and tests performed by another provider:  ? ?None. ? ?Brief History, Exam, Impression, and Recommendations:   ? ?Effusion of knee joint right ?Roxane returns, she continues to have knee pain and swelling with radiation into the calf, we initially saw her in December after a twist and a pop, did an aspiration and injection and she did well. ?Unfortunately she is having a recurrence of symptoms, we did another aspiration and injection, at this point she has failed greater than 6 weeks of conservative treatment including multiple injections, NSAIDs, activity modification, physician directed physical therapy so we will also proceed with MRI ? ? ? ?___________________________________________ ?[name]. Benjamin Stain, M.D., ABFM., CAQSM. ?Primary Care and Sports Medicine ?[facility] ? ?Adjunct Instructor of Family Medicine  ?[employer] ?

## 2021-07-23 NOTE — Assessment & Plan Note (Signed)
Patricia Arroyo returns, she continues to have knee pain and swelling with radiation into the calf, we initially saw her in December after a twist and a pop, did an aspiration and injection and she did well. ?Unfortunately she is having a recurrence of symptoms, we did another aspiration and injection, at this point she has failed greater than 6 weeks of conservative treatment including multiple injections, NSAIDs, activity modification, physician directed physical therapy so we will also proceed with MRI ?

## 2021-08-02 ENCOUNTER — Ambulatory Visit: Payer: 59 | Admitting: Sports Medicine

## 2021-08-02 ENCOUNTER — Ambulatory Visit (INDEPENDENT_AMBULATORY_CARE_PROVIDER_SITE_OTHER): Payer: 59

## 2021-08-02 DIAGNOSIS — M25461 Effusion, right knee: Secondary | ICD-10-CM | POA: Diagnosis not present

## 2021-08-02 DIAGNOSIS — M25561 Pain in right knee: Secondary | ICD-10-CM

## 2021-08-03 ENCOUNTER — Encounter: Payer: Self-pay | Admitting: Sports Medicine

## 2021-08-10 ENCOUNTER — Telehealth: Payer: Self-pay | Admitting: Sports Medicine

## 2021-08-10 DIAGNOSIS — M25461 Effusion, right knee: Secondary | ICD-10-CM

## 2021-08-10 NOTE — Telephone Encounter (Signed)
Pt stopped in today for new brace and scheduled appt on 05/31. She wanted to tell you to go ahead and put referral in to Dr.Landau for knee surgery. ?

## 2021-08-10 NOTE — Telephone Encounter (Signed)
Done

## 2021-09-01 ENCOUNTER — Ambulatory Visit: Payer: 59 | Admitting: Sports Medicine

## 2021-09-01 DIAGNOSIS — S83206S Unspecified tear of unspecified meniscus, current injury, right knee, sequela: Secondary | ICD-10-CM

## 2021-09-01 DIAGNOSIS — S83206D Unspecified tear of unspecified meniscus, current injury, right knee, subsequent encounter: Secondary | ICD-10-CM | POA: Diagnosis not present

## 2021-09-01 DIAGNOSIS — M19031 Primary osteoarthritis, right wrist: Secondary | ICD-10-CM | POA: Insufficient documentation

## 2021-09-01 DIAGNOSIS — M19032 Primary osteoarthritis, left wrist: Secondary | ICD-10-CM | POA: Insufficient documentation

## 2021-09-01 DIAGNOSIS — M19042 Primary osteoarthritis, left hand: Secondary | ICD-10-CM

## 2021-09-01 MED ORDER — PREDNISONE 50 MG PO TABS: 50.0000 mg | ORAL_TABLET | Freq: Every day | ORAL | 0 refills | Status: DC

## 2021-09-01 NOTE — Assessment & Plan Note (Signed)
Patricia Arroyo returns, she is a pleasant 56 year old female, we diagnosed her with a meniscal tear, she has had aspirations and injections in the past without sufficient improvement so she understands that arthroscopic debridement is the next step, she did have a little bit of swelling today but not enough to drain, she has been putting off her appointment with Dr. Dion Saucier. We will do a course of prednisone, home cryotherapy, and I have advised her to follow this up with Dr. Dion Saucier.

## 2021-09-01 NOTE — Progress Notes (Signed)
    Procedures performed today:    None.  Independent interpretation of notes and tests performed by another provider:   None.  Brief History, Exam, Impression, and Recommendations:    Acute meniscal tear of right knee Patricia Arroyo returns, she is a pleasant 56 year old female, we diagnosed her with a meniscal tear, she has had aspirations and injections in the past without sufficient improvement so she understands that arthroscopic debridement is the next step, she did have a little bit of swelling today but not enough to drain, she has been putting off her appointment with Dr. Dion Saucier. We will do a course of prednisone, home cryotherapy, and I have advised her to follow this up with Dr. Dion Saucier.  Primary osteoarthritis of left hand Also having pain left second MCP, minimal swelling, likely early osteoarthritis, the prednisone for her knee will help, return to see me as needed.  Chronic process with exacerbation and pharmacologic intervention  ___________________________________________ Patricia Arroyo. Patricia Arroyo, M.D., ABFM., CAQSM. Primary Care and Sports Medicine [facility]  Adjunct Instructor of Family Medicine  [employer]

## 2021-09-01 NOTE — Assessment & Plan Note (Signed)
Also having pain left second MCP, minimal swelling, likely early osteoarthritis, the prednisone for her knee will help, return to see me as needed.

## 2021-09-03 ENCOUNTER — Other Ambulatory Visit: Payer: Self-pay | Admitting: Family Medicine

## 2021-10-19 ENCOUNTER — Ambulatory Visit: Payer: 59 | Admitting: Sports Medicine

## 2021-10-19 DIAGNOSIS — M76812 Anterior tibial syndrome, left leg: Secondary | ICD-10-CM | POA: Diagnosis not present

## 2021-10-19 NOTE — Assessment & Plan Note (Signed)
This is a very pleasant 56 year old female, she has about a month of anterior tibial pain distally. Swelling. On exam she does have reproduction of pain with resisted dorsiflexion of the foot and palpable at the distal tibialis anterior. This is tibialis anterior tendinitis, she will continue her etodolac and wear compression hose. Adding conditioning, return as needed.

## 2021-10-19 NOTE — Progress Notes (Signed)
    Procedures performed today:    None.  Independent interpretation of notes and tests performed by another provider:   None.  Brief History, Exam, Impression, and Recommendations:    Anterior tibialis tendinitis, left This is a very pleasant 56 year old female, she has about a month of anterior tibial pain distally. Swelling. On exam she does have reproduction of pain with resisted dorsiflexion of the foot and palpable at the distal tibialis anterior. This is tibialis anterior tendinitis, she will continue her etodolac and wear compression hose. Adding conditioning, return as needed.    ____________________________________________ [name]. Benjamin Stain, M.D., ABFM., CAQSM., AME. Primary Care and Sports Medicine [facility]  Adjunct Professor of Family Medicine  [employer]  Restaurant manager, fast food

## 2021-11-12 ENCOUNTER — Ambulatory Visit: Payer: 59 | Admitting: Family Medicine

## 2021-11-12 ENCOUNTER — Encounter: Payer: Self-pay | Admitting: Family Medicine

## 2021-11-12 VITALS — BP 109/71 | HR 79 | Ht 62.0 in | Wt 152.0 lb

## 2021-11-12 DIAGNOSIS — W57XXXA Bitten or stung by nonvenomous insect and other nonvenomous arthropods, initial encounter: Secondary | ICD-10-CM

## 2021-11-12 DIAGNOSIS — E119 Type 2 diabetes mellitus without complications: Secondary | ICD-10-CM | POA: Diagnosis not present

## 2021-11-12 LAB — POCT UA - MICROALBUMIN
Creatinine, POC: 50 mg/dL
Microalbumin Ur, POC: 10 mg/L

## 2021-11-12 MED ORDER — DOXYCYCLINE HYCLATE 100 MG PO TABS: 100.0000 mg | ORAL_TABLET | Freq: Two times a day (BID) | ORAL | 0 refills | Status: AC

## 2021-11-12 NOTE — Progress Notes (Signed)
Patricia Arroyo - 56 y.o. female MRN [id number]  Date of birth: Jul 05, 1965  Subjective Chief Complaint  Patient presents with  . Tick Bites  . Rash  . Hand Pain    HPI Patricia Arroyo is a 56 y.o. female here today with complaint of tick bite.  First noticed tick bite in July, then had an additional tick bite a few days ago.  She noticed a raised, swollen area on the lower back where the initial tick was removed.  No rash or swelling after the second tick.  Overall she has felt ok.  Denies fever, chills, or flu like symptoms.  She has  Allergies  Allergen Reactions  . Adhesive [Tape] Hives and Other (See Comments)    Pt states that she is allergic to adhesive on Band-Aids.  . Banana     Burning sensation on tongue  . Diflucan [Fluconazole] Hives  . Other Other (See Comments) and Hives    Pt states that she is allergic to adhesive on Band-Aids.  . Penicillins Rash  . Sulfa Antibiotics Rash    Past Medical History:  Diagnosis Date  . ADHD (attention deficit hyperactivity disorder)   . Anxiety   . Carpal tunnel syndrome   . Depression    h/o---no meds now  . Diabetes mellitus without complication (HCC)   . GERD (gastroesophageal reflux disease)   . Hypertension   . IBS (irritable bowel syndrome)   . Varicose veins     Past Surgical History:  Procedure Laterality Date  . ABDOMINOPLASTY  2020  . ANTERIOR AND POSTERIOR REPAIR  03/10/2011   Procedure: ANTERIOR (CYSTOCELE) AND POSTERIOR REPAIR (RECTOCELE);  Surgeon: Turner Daniels, MD;  Location: [facility];  Service: Gynecology;  Laterality: N/A;  with Sacrospinous Ligament Suspension  . BREAST REDUCTION SURGERY Bilateral 2020  . CARPAL TUNNEL RELEASE Bilateral 04/01/2013   Procedure: BILATERAL CARPAL TUNNEL RELEASE;  Surgeon: Tami Ribas, MD;  Location: [facility];  Service: Orthopedics;  Laterality: Bilateral;  . COLONOSCOPY    . DILATION AND CURETTAGE OF UTERUS    . FOOT FASCIOTOMY  2005   both feet  . LAPAROSCOPIC  ASSISTED VAGINAL HYSTERECTOMY  03/10/2011   Procedure: LAPAROSCOPIC ASSISTED VAGINAL HYSTERECTOMY;  Surgeon: Turner Daniels, MD;  Location: [facility];  Service: Gynecology;  Laterality: N/A;  . NOVASURE ABLATION    . SALPINGOOPHORECTOMY  03/10/2011   Procedure: SALPINGO OOPHERECTOMY;  Surgeon: Turner Daniels, MD;  Location: [facility];  Service: Gynecology;  Laterality: Bilateral;  . WISDOM TOOTH EXTRACTION      Social History   Socioeconomic History  . Marital status: Married    Spouse name: Not on file  . Number of children: Not on file  . Years of education: Not on file  . Highest education level: Not on file  Occupational History  . Not on file  Tobacco Use  . Smoking status: Never  . Smokeless tobacco: Never  Vaping Use  . Vaping Use: Never used  Substance and Sexual Activity  . Alcohol use: No    Alcohol/week: 0.0 standard drinks of alcohol    Comment: occasionally  . Drug use: No  . Sexual activity: Yes    Partners: Male    Birth control/protection: Post-menopausal  Other Topics Concern  . Not on file  Social History Narrative  . Not on file   Social Determinants of Health   Financial Resource Strain: Not on file  Food Insecurity: Not on file  Transportation  Needs: Not on file  Physical Activity: Not on file  Stress: Not on file  Social Connections: Not on file    Family History  Problem Relation Age of Onset  . Cancer Mother   . Hypertension Mother   . Stroke Mother   . Ovarian cancer Mother   . Diabetes Father   . Heart attack Father   . Stroke Father   . Diabetes Sister   . Ovarian cancer Sister     Health Maintenance  Topic Date Due  . Diabetic kidney evaluation - Urine ACR  10/05/2018  . HEMOGLOBIN A1C  10/20/2021  . INFLUENZA VACCINE  11/02/2021  . OPHTHALMOLOGY EXAM  04/04/2022 (Originally 08/02/2020)  . Zoster Vaccines- Shingrix (1 of 2) 04/04/2022 (Originally 06/17/2013)  . MAMMOGRAM  03/04/2022  . Diabetic kidney evaluation - GFR measurement   04/22/2022  . FOOT EXAM  11/13/2022  . COLONOSCOPY (Pts 45-76yrs Insurance coverage will need to be confirmed)  01/17/2024  . TETANUS/TDAP  10/05/2027  . COVID-19 Vaccine  Completed  . Hepatitis C Screening  Completed  . HIV Screening  Completed  . HPV VACCINES  Aged Out     ----------------------------------------------------------------------------------------------------------------------------------------------------------------------------------------------------------------- Physical Exam BP 109/71 (BP Location: Left Arm, Patient Position: Sitting, Cuff Size: Normal)   Pulse 79   Ht 5\' 2"  (1.575 m)   Wt 152 lb (68.9 kg)   SpO2 99%   BMI 27.80 kg/m   Physical Exam  ------------------------------------------------------------------------------------------------------------------------------------------------------------------------------------------------------------------- Assessment and Plan  No problem-specific Assessment & Plan notes found for this encounter.   Meds ordered this encounter  Medications  . doxycycline (VIBRA-TABS) 100 MG tablet    Sig: Take 1 tablet (100 mg total) by mouth 2 (two) times daily for 14 days.    Dispense:  28 tablet    Refill:  0    No follow-ups on file.    This visit occurred during the SARS-CoV-2 public health emergency.  Safety protocols were in place, including screening questions prior to the visit, additional usage of staff PPE, and extensive cleaning of exam room while observing appropriate contact time as indicated for disinfecting solutions.

## 2021-11-12 NOTE — Patient Instructions (Signed)
Tick Bite Information, Adult  Ticks are insects that can bite. Most ticks live in bushes and grassy areas. They climb onto people and animals that go by. Then they bite. Some ticks carry germs that can make you sick. How can I prevent tick bites? Take these steps: Before you go outside: Wear long sleeves and long pants. Wear light-colored clothes. Tuck your pant legs into your socks. Use an insect repellent that has 20% or higher of the ingredients DEET, picaridin, or IR3535. Follow the instructions on the label. Put it on: Bare skin. Avoid your eyes and mouth areas. The tops of your boots. Your pant legs. The ends of your sleeves. If you use an insect repellent that has the ingredient permethrin, follow the instructions on the label. Do not put permethrin on the skin. Put it on: Clothing. Shoes. Outdoor gear. Tents. When you are outside Avoid walking through long grass. Stay in the middle of the trail. Do not touch the bushes. Check for ticks on your clothes, hair, and skin often while you are outside. Check again before you go inside. When you go indoors Check your clothes for ticks. Dry your clothes in a dryer on high heat for 10 minutes or more. If clothes are damp, more time may be needed. Wash your clothes right away if they need to be washed. Use hot water. Check your pets and outdoor gear. Shower right away. Check your body for ticks. Do a full body check using a mirror. Check your clothes, skin, head, neck, armpits, waist, groin, and joint areas. What is the right way to remove a tick? Remove the tick from your skin as soon as possible. Do not remove the tick with your bare fingers. Do not try to remove a tick with heat, alcohol, petroleum jelly, or fingernail polish. To remove a tick that is crawling on your skin: Go outside and brush the tick off. Use tape or a lint roller. To remove a tick that is biting: Wash your hands. If you have gloves, put them on. Use tweezers,  curved forceps, or a tick-removal tool to grasp the tick. Grasp the tick as close to your skin and as close to the tick's head as possible. Gently pull up until the tick lets go. Try to keep the tick's head attached to its body. Do not twist or jerk the tick. Do not squeeze or crush the tick. What should I do after taking out a tick? Clean the bite area and your hands with soap and water, rubbing alcohol, or an iodine wash. If you have an antiseptic cream or ointment, put a small amount on the bite area. Wash and disinfect any instruments that you used to remove the tick. How should I get rid of a live tick? To get rid of a live tick, use one of these methods: Place the tick in rubbing alcohol. Place it in a bag or container you can close tightly. Throw it away. Wrap it tightly in tape. Throw it away. Flush it down the toilet. Where to find more information Centers for Disease Control and Prevention: cdc.gov/ticks U.[place]. Environmental Protection Agency: epa.gov/insect-repellents Contact a doctor if: You have a fever or chills. You have a red rash that makes a circle (bull's-eye rash) in the bite area. You have redness and swelling where the tick bit you. You have a headache or stiff neck. You have pain in a muscle, joint, or bone. You are more tired than normal. You have trouble walking or   moving your legs. You have numbness in your legs. You have tender or swollen lymph glands. You have belly (abdominal) pain, vomiting, watery poop (diarrhea), or weight loss. Get help right away if: You cannot remove a tick. You cannot move (have paralysis) or feel weak. You are feeling worse or have new symptoms. You find a tick that is biting you and filled with blood, especially if you are in an area where diseases from ticks are common. Summary Ticks may carry germs that can make you sick. To prevent tick bites wear long sleeves, long pants, and light colors. Use insect repellent. Follow the  instructions on the label. If the tick is biting, remove it right away. Do not try to remove it with heat, alcohol, petroleum jelly, or fingernail polish. Contact a doctor if you have symptoms of a disease after being bitten by a tick. This information is not intended to replace advice given to you by your health care provider. Make sure you discuss any questions you have with your health care provider. Document Revised: 06/21/2021 Document Reviewed: 06/21/2021 Elsevier Patient Education  2023 Elsevier Inc.  

## 2021-11-14 DIAGNOSIS — W57XXXA Bitten or stung by nonvenomous insect and other nonvenomous arthropods, initial encounter: Secondary | ICD-10-CM | POA: Insufficient documentation

## 2021-11-14 NOTE — Assessment & Plan Note (Addendum)
She has had multiple tick bites over the past couple weeks.  Increased joint pain may be coincidental.  Will cover with 2-week course of doxycycline empirically.  She will let me know if she develops any new symptoms.

## 2021-11-24 ENCOUNTER — Ambulatory Visit: Payer: 59 | Admitting: Sports Medicine

## 2021-11-24 DIAGNOSIS — I8390 Asymptomatic varicose veins of unspecified lower extremity: Secondary | ICD-10-CM

## 2021-11-24 DIAGNOSIS — M47817 Spondylosis without myelopathy or radiculopathy, lumbosacral region: Secondary | ICD-10-CM

## 2021-11-24 DIAGNOSIS — M19031 Primary osteoarthritis, right wrist: Secondary | ICD-10-CM | POA: Diagnosis not present

## 2021-11-24 DIAGNOSIS — M19032 Primary osteoarthritis, left wrist: Secondary | ICD-10-CM

## 2021-11-24 MED ORDER — DICLOFENAC SODIUM 1 % EX GEL: 4.0000 g | Freq: Four times a day (QID) | CUTANEOUS | 11 refills | Status: AC

## 2021-11-24 NOTE — Assessment & Plan Note (Signed)
Bilateral hand and wrist pain, historically an MRI years ago showed CMC and STT osteoarthritis. At this point she has failed greater than 6 weeks of conservative treatment, adding bilateral wrist MRIs without contrast.  She tells me she has already had x-rays. This is for interventional planning. In the meantime she will continue Lodine, add topical NSAIDs, and eat a Mediterranean type diet.

## 2021-11-24 NOTE — Patient Instructions (Signed)

## 2021-11-24 NOTE — Assessment & Plan Note (Signed)
Pleasant 56 year old female comes in with chronic axial low back pain, right-sided, worse with standing. I did review her CT from 2022, she does have fairly severe bilateral L3-L4 facet arthropathy and right SI joint subchondral sclerosis suggestive of SI osteoarthritis worse on the right. These are the likely pain generators, with the SI joint being the dominant pain generator I think. Adding SI joint conditioning, topical Voltaren, if no better in 6 weeks we will consider an SI joint injection.

## 2021-11-24 NOTE — Progress Notes (Signed)
    Procedures performed today:    None.  Independent interpretation of notes and tests performed by another provider:   I personally reviewed a CT scan from 2022, this was done for renal stones, I was able to see it fairly severe L3-L4 facet arthritis, lesser so at the above level, she also had moderate SI joint subchondral sclerosis suggestive of SI osteoarthritis.  Brief History, Exam, Impression, and Recommendations:    Lumbosacral spondylosis Pleasant 56 year old female comes in with chronic axial low back pain, right-sided, worse with standing. I did review her CT from 2022, she does have fairly severe bilateral L3-L4 facet arthropathy and right SI joint subchondral sclerosis suggestive of SI osteoarthritis worse on the right. These are the likely pain generators, with the SI joint being the dominant pain generator I think. Adding SI joint conditioning, topical Voltaren, if no better in 6 weeks we will consider an SI joint injection.  Osteoarthritis of both wrists and hands Bilateral hand and wrist pain, historically an MRI years ago showed CMC and STT osteoarthritis. At this point she has failed greater than 6 weeks of conservative treatment, adding bilateral wrist MRIs without contrast.  She tells me she has already had x-rays. This is for interventional planning. In the meantime she will continue Lodine, add topical NSAIDs, and eat a Mediterranean type diet.  Varicose veins without complication Crystalee had some questions about her varicose veins, I have advised her to wear compression stockings, elevate when resting, and to avoid intervention unless she could not live with the discomfort.  If the discomfort became unbearable and she was doing all of the above then we would consider referral to vascular surgery for vein stripping or laser vein ablation, I was clear with the patient that I was not specialist in phlebology and that the question should be directed to her primary care  provider and/or a vascular surgeon..  I spent 40 minutes of total time managing this patient today, this includes chart review, face to face, and non-face to face time, specifically this was spent answering multiple questions about daytime stiffness, left wrist pain, right wrist pain, back pain, venous varicosities, dietary changes.  ____________________________________________ [name]. Benjamin Stain, M.D., ABFM., CAQSM., AME. Primary Care and Sports Medicine [facility]  Adjunct Professor of Family Medicine  [place] [employer] [facility] [employer]  Restaurant manager, fast food

## 2021-11-24 NOTE — Assessment & Plan Note (Signed)
Patricia Arroyo had some questions about her varicose veins, I have advised her to wear compression stockings, elevate when resting, and to avoid intervention unless she could not live with the discomfort.  If the discomfort became unbearable and she was doing all of the above then we would consider referral to vascular surgery for vein stripping or laser vein ablation, I was clear with the patient that I was not specialist in phlebology and that the question should be directed to her primary care provider and/or a vascular surgeon.Marland Kitchen

## 2021-11-28 ENCOUNTER — Other Ambulatory Visit: Payer: 59

## 2021-11-29 ENCOUNTER — Ambulatory Visit: Payer: 59 | Admitting: Sports Medicine

## 2021-11-30 ENCOUNTER — Ambulatory Visit (INDEPENDENT_AMBULATORY_CARE_PROVIDER_SITE_OTHER): Payer: 59

## 2021-11-30 DIAGNOSIS — M19032 Primary osteoarthritis, left wrist: Secondary | ICD-10-CM

## 2021-11-30 DIAGNOSIS — M19031 Primary osteoarthritis, right wrist: Secondary | ICD-10-CM | POA: Diagnosis not present

## 2021-12-01 ENCOUNTER — Encounter (INDEPENDENT_AMBULATORY_CARE_PROVIDER_SITE_OTHER): Payer: 59 | Admitting: Sports Medicine

## 2021-12-01 DIAGNOSIS — M47817 Spondylosis without myelopathy or radiculopathy, lumbosacral region: Secondary | ICD-10-CM

## 2021-12-02 NOTE — Telephone Encounter (Signed)

## 2021-12-13 ENCOUNTER — Telehealth: Payer: Self-pay

## 2021-12-13 NOTE — Telephone Encounter (Signed)
Initiated Prior authorization [id number] Sodium 1% gel Via: Covermymeds Case/Key:[id number] Status: approved  as of 12/13/21 Reason:this request is approved from 12/13/2021 to 12/12/2024 Notified Pt via: Mychart

## 2021-12-14 ENCOUNTER — Other Ambulatory Visit: Payer: 59

## 2021-12-19 ENCOUNTER — Ambulatory Visit (INDEPENDENT_AMBULATORY_CARE_PROVIDER_SITE_OTHER): Payer: 59

## 2021-12-19 DIAGNOSIS — M47817 Spondylosis without myelopathy or radiculopathy, lumbosacral region: Secondary | ICD-10-CM

## 2021-12-30 ENCOUNTER — Ambulatory Visit: Payer: 59 | Admitting: Sports Medicine

## 2021-12-30 DIAGNOSIS — M47817 Spondylosis without myelopathy or radiculopathy, lumbosacral region: Secondary | ICD-10-CM

## 2021-12-30 DIAGNOSIS — S83206S Unspecified tear of unspecified meniscus, current injury, right knee, sequela: Secondary | ICD-10-CM

## 2021-12-30 DIAGNOSIS — M1712 Unilateral primary osteoarthritis, left knee: Secondary | ICD-10-CM

## 2021-12-30 MED ORDER — CYCLOBENZAPRINE HCL 5 MG PO TABS: 2.5000 mg | ORAL_TABLET | Freq: Three times a day (TID) | ORAL | 11 refills | Status: AC | PRN

## 2021-12-30 NOTE — Progress Notes (Signed)
    Procedures performed today:    None.  Independent interpretation of notes and tests performed by another provider:   None.  Brief History, Exam, Impression, and Recommendations:    Primary osteoarthritis of left knee Left knee is having some pain with gelling, she had an Orthovisc series back in 2017 and a steroid injection in April 2018. We will hold off on additional treatment for now.  Acute meniscal tear of right knee Ahnna also has a fairly large meniscal tear with degenerative changes, she is going to have a meniscal debridement with Dr. Mardelle Matte sometime in December. In the meantime she will do some of her Flexeril which seems to help all of her pains.  Lumbosacral spondylosis Chronic axial low back pain right-sided worse with standing, she does have severe bilateral L3-L4 facet arthropathy and right SI joint subchondral sclerosis suggestive of SI joint arthritis worse on the right. Those are the likely pain generators. Did have a flare of pain and Flexeril seems to help, continue SI joint conditioning, we will refill her Flexeril for use up to 3 times daily as needed, I did advise her that it was best to try medication before considering interventional or invasive procedures like injections.  I spent 30 minutes of total time managing this patient today, this includes chart review, face to face, and non-face to face time.  ____________________________________________ Gwen Her. Dianah Field, M.D., ABFM., CAQSM., AME. Primary Care and Sports Medicine [facility]  Adjunct Professor of [place] of [facility] [employer]  Risk manager

## 2021-12-30 NOTE — Assessment & Plan Note (Signed)
Patricia Arroyo also has a fairly large meniscal tear with degenerative changes, she is going to have a meniscal debridement with Dr. Mardelle Matte sometime in December. In the meantime she will do some of her Flexeril which seems to help all of her pains.

## 2021-12-30 NOTE — Assessment & Plan Note (Signed)
Left knee is having some pain with gelling, she had an Orthovisc series back in 2017 and a steroid injection in April 2018. We will hold off on additional treatment for now.

## 2021-12-30 NOTE — Assessment & Plan Note (Signed)
Chronic axial low back pain right-sided worse with standing, she does have severe bilateral L3-L4 facet arthropathy and right SI joint subchondral sclerosis suggestive of SI joint arthritis worse on the right. Those are the likely pain generators. Did have a flare of pain and Flexeril seems to help, continue SI joint conditioning, we will refill her Flexeril for use up to 3 times daily as needed, I did advise her that it was best to try medication before considering interventional or invasive procedures like injections.

## 2022-01-09 ENCOUNTER — Other Ambulatory Visit: Payer: Self-pay | Admitting: Family Medicine

## 2022-03-02 ENCOUNTER — Other Ambulatory Visit: Payer: Self-pay | Admitting: Family Medicine

## 2022-03-18 ENCOUNTER — Ambulatory Visit (INDEPENDENT_AMBULATORY_CARE_PROVIDER_SITE_OTHER): Payer: 59

## 2022-03-18 ENCOUNTER — Ambulatory Visit: Payer: 59 | Admitting: Sports Medicine

## 2022-03-18 DIAGNOSIS — M47817 Spondylosis without myelopathy or radiculopathy, lumbosacral region: Secondary | ICD-10-CM

## 2022-03-18 MED ORDER — TRIAMCINOLONE ACETONIDE 40 MG/ML IJ SUSP
40.0000 mg | Freq: Once | INTRAMUSCULAR | Status: AC
  Administered 2022-03-18: 40 mg via INTRAMUSCULAR

## 2022-03-18 NOTE — Progress Notes (Signed)
    Procedures performed today:    Procedure: Real-time Ultrasound Guided injection of the right sacroiliac joint Device: Samsung HS60  Verbal informed consent obtained.  Time-out conducted.  Noted no overlying erythema, induration, or other signs of local infection.  Skin prepped in a sterile fashion.  Local anesthesia: Topical Ethyl chloride.  With sterile technique and under real time ultrasound guidance: Noted mildly arthritic joint, 1 cc Kenalog 40, 2 cc lidocaine, 2 cc bupivacaine injected easily Completed without difficulty  Advised to call if fevers/chills, erythema, induration, drainage, or persistent bleeding.  Images permanently stored and available for review in PACS.  Impression: Technically successful ultrasound guided injection.  Independent interpretation of notes and tests performed by another provider:   None.  Brief History, Exam, Impression, and Recommendations:    Lumbosacral spondylosis Patricia Arroyo returns, she continues to have right-sided axial low back pain with radiation into the buttock, MRI showed bilateral L3-L4 facet arthritis, right SI joint subchondral sclerosis suggestive of arthritis as well. Today she has tenderness at the sacroiliac joint as well as a positive Patrick's test on the right side, I injected her right sacroiliac joint today for diagnostic and therapeutic purposes, if she does not get sufficient relief we will target her right lower 3 facet joints. Return in 6 weeks.    ____________________________________________ [name]. Benjamin Stain, M.D., ABFM., CAQSM., AME. Primary Care and Sports Medicine [facility]  Adjunct Professor of Family Medicine  [place] [employer] [facility] [employer]  Restaurant manager, fast food

## 2022-03-18 NOTE — Assessment & Plan Note (Signed)
Patricia Arroyo returns, she continues to have right-sided axial low back pain with radiation into the buttock, MRI showed bilateral L3-L4 facet arthritis, right SI joint subchondral sclerosis suggestive of arthritis as well. Today she has tenderness at the sacroiliac joint as well as a positive Patrick's test on the right side, I injected her right sacroiliac joint today for diagnostic and therapeutic purposes, if she does not get sufficient relief we will target her right lower 3 facet joints. Return in 6 weeks.

## 2022-03-25 ENCOUNTER — Telehealth: Payer: Self-pay | Admitting: Sports Medicine

## 2022-03-25 NOTE — Telephone Encounter (Signed)
She just had a lumbar MRI in September, too soon to do another 1.

## 2022-03-25 NOTE — Telephone Encounter (Signed)
Patient is requesting an MRI on left knee she is having pain and discomfort

## 2022-03-31 ENCOUNTER — Ambulatory Visit: Payer: 59 | Admitting: Sports Medicine

## 2022-03-31 DIAGNOSIS — Z23 Encounter for immunization: Secondary | ICD-10-CM

## 2022-03-31 DIAGNOSIS — M19032 Primary osteoarthritis, left wrist: Secondary | ICD-10-CM

## 2022-03-31 DIAGNOSIS — M19031 Primary osteoarthritis, right wrist: Secondary | ICD-10-CM | POA: Diagnosis not present

## 2022-03-31 DIAGNOSIS — M1712 Unilateral primary osteoarthritis, left knee: Secondary | ICD-10-CM | POA: Diagnosis not present

## 2022-03-31 DIAGNOSIS — S83206S Unspecified tear of unspecified meniscus, current injury, right knee, sequela: Secondary | ICD-10-CM

## 2022-03-31 DIAGNOSIS — Z0000 Encounter for general adult medical examination without abnormal findings: Secondary | ICD-10-CM

## 2022-03-31 NOTE — Assessment & Plan Note (Signed)
Now status post arthroscopic intervention with Dr. Dion Saucier and doing really well.

## 2022-03-31 NOTE — Assessment & Plan Note (Signed)
Persistent pain and spite of steroid injections, NSAIDs, greater than 6 weeks of conditioning, degenerative changes on x-rays, proceeding with MRI. MRI will be for surgical planning.

## 2022-03-31 NOTE — Assessment & Plan Note (Signed)
Patricia Arroyo returns, she is a pleasant 56 year old female, chronic bilateral wrist pain with MRI confirmed CMC, STT and radiocarpal osteoarthritis. She will continue conservative treatment with Lodine, add compression sleeves and I am going to add some home conditioning, will do this for at least 6 weeks before considering radiocarpal injections.

## 2022-03-31 NOTE — Assessment & Plan Note (Signed)
COVID and flu vaccinations given today.

## 2022-03-31 NOTE — Progress Notes (Signed)
    Procedures performed today:    None.  Independent interpretation of notes and tests performed by another provider:   None.  Brief History, Exam, Impression, and Recommendations:    Osteoarthritis of both wrists and hands Patricia Arroyo returns, she is a pleasant 56 year old female, chronic bilateral wrist pain with MRI confirmed CMC, STT and radiocarpal osteoarthritis. She will continue conservative treatment with Lodine, add compression sleeves and I am going to add some home conditioning, will do this for at least 6 weeks before considering radiocarpal injections.  Primary osteoarthritis of left knee Persistent pain and spite of steroid injections, NSAIDs, greater than 6 weeks of conditioning, degenerative changes on x-rays, proceeding with MRI. MRI will be for surgical planning.  Acute meniscal tear of right knee Now status post arthroscopic intervention with Dr. Dion Saucier and doing really well.  Well adult exam COVID and flu vaccinations given today.    ____________________________________________ [name]. Benjamin Stain, M.D., ABFM., CAQSM., AME. Primary Care and Sports Medicine [facility]  Adjunct Professor of Family Medicine  [place] [employer] [facility] [employer]  Restaurant manager, fast food

## 2022-04-20 ENCOUNTER — Other Ambulatory Visit: Payer: Self-pay | Admitting: Family Medicine

## 2022-04-20 DIAGNOSIS — E119 Type 2 diabetes mellitus without complications: Secondary | ICD-10-CM

## 2022-04-20 DIAGNOSIS — I10 Essential (primary) hypertension: Secondary | ICD-10-CM

## 2022-04-20 DIAGNOSIS — Z0000 Encounter for general adult medical examination without abnormal findings: Secondary | ICD-10-CM

## 2022-04-20 DIAGNOSIS — K760 Fatty (change of) liver, not elsewhere classified: Secondary | ICD-10-CM

## 2022-04-21 LAB — CBC WITH DIFFERENTIAL/PLATELET
Absolute Monocytes: 366 cells/uL (ref 200–950)
Basophils Absolute: 28 cells/uL (ref 0–200)
Basophils Relative: 0.4 %
Eosinophils Absolute: 0 cells/uL — ABNORMAL LOW (ref 15–500)
Eosinophils Relative: 0 %
HCT: 45.7 % — ABNORMAL HIGH (ref 35.0–45.0)
Hemoglobin: 15.2 g/dL (ref 11.7–15.5)
Lymphs Abs: 1780 cells/uL (ref 850–3900)
MCH: 30.8 pg (ref 27.0–33.0)
MCHC: 33.3 g/dL (ref 32.0–36.0)
MCV: 92.5 fL (ref 80.0–100.0)
MPV: 10.1 fL (ref 7.5–12.5)
Monocytes Relative: 5.3 %
Neutro Abs: 4727 cells/uL (ref 1500–7800)
Neutrophils Relative %: 68.5 %
Platelets: 225 10*3/uL (ref 140–400)
RBC: 4.94 10*6/uL (ref 3.80–5.10)
RDW: 12.3 % (ref 11.0–15.0)
Total Lymphocyte: 25.8 %
WBC: 6.9 10*3/uL (ref 3.8–10.8)

## 2022-04-21 LAB — HEMOGLOBIN A1C
Hgb A1c MFr Bld: 5.8 % of total Hgb — ABNORMAL HIGH (ref <5.7)
Mean Plasma Glucose: 120 mg/dL
eAG (mmol/L): 6.6 mmol/L

## 2022-04-21 LAB — COMPLETE METABOLIC PANEL WITH GFR
AG Ratio: 1.8 (calc) (ref 1.0–2.5)
ALT: 17 U/L (ref 6–29)
AST: 18 U/L (ref 10–35)
Albumin: 4.6 g/dL (ref 3.6–5.1)
Alkaline phosphatase (APISO): 86 U/L (ref 37–153)
BUN: 14 mg/dL (ref 7–25)
CO2: 30 mmol/L (ref 20–32)
Calcium: 9.7 mg/dL (ref 8.6–10.4)
Chloride: 103 mmol/L (ref 98–110)
Creat: 0.84 mg/dL (ref 0.50–1.03)
Globulin: 2.5 g/dL (calc) (ref 1.9–3.7)
Glucose, Bld: 79 mg/dL (ref 65–99)
Potassium: 3.7 mmol/L (ref 3.5–5.3)
Sodium: 144 mmol/L (ref 135–146)
Total Bilirubin: 0.7 mg/dL (ref 0.2–1.2)
Total Protein: 7.1 g/dL (ref 6.1–8.1)
eGFR: 80 mL/min/{1.73_m2} (ref >=60)

## 2022-04-21 LAB — LIPID PANEL W/REFLEX DIRECT LDL
Cholesterol: 214 mg/dL — ABNORMAL HIGH (ref <200)
HDL: 76 mg/dL (ref >=50)
LDL Cholesterol (Calc): 118 mg/dL (calc) — ABNORMAL HIGH
Non-HDL Cholesterol (Calc): 138 mg/dL (calc) — ABNORMAL HIGH (ref <130)
Total CHOL/HDL Ratio: 2.8 (calc) (ref <5.0)
Triglycerides: 94 mg/dL (ref <150)

## 2022-04-21 LAB — MICROALBUMIN / CREATININE URINE RATIO
Creatinine, Urine: 100 mg/dL (ref 20–275)
Microalb Creat Ratio: 3 mcg/mg creat (ref <30)
Microalb, Ur: 0.3 mg/dL

## 2022-04-21 LAB — VITAMIN D 25 HYDROXY (VIT D DEFICIENCY, FRACTURES): Vit D, 25-Hydroxy: 18 ng/mL — ABNORMAL LOW (ref 30–100)

## 2022-04-21 LAB — TSH: TSH: 1.29 mIU/L (ref 0.40–4.50)

## 2022-04-26 ENCOUNTER — Encounter: Payer: 59 | Admitting: Family Medicine

## 2022-04-29 ENCOUNTER — Ambulatory Visit: Payer: 59 | Admitting: Sports Medicine

## 2022-05-03 ENCOUNTER — Other Ambulatory Visit: Payer: Self-pay | Admitting: Family Medicine

## 2022-05-03 DIAGNOSIS — M5412 Radiculopathy, cervical region: Secondary | ICD-10-CM

## 2022-05-03 NOTE — Telephone Encounter (Signed)
Please contact pt to schedule appt for Diabetes and medication refills. Last seen 11/2021. Sending med refill to hold. Thanks

## 2022-05-03 NOTE — Telephone Encounter (Signed)
Pt stated that she is not on diabetic medication she ha that under control and on a program at work. The refill etodolac is for arthritis and Dr.T prescribed that. Pt has an appt 05/11/22.

## 2022-05-11 ENCOUNTER — Encounter: Payer: Self-pay | Admitting: Family Medicine

## 2022-05-11 ENCOUNTER — Ambulatory Visit (INDEPENDENT_AMBULATORY_CARE_PROVIDER_SITE_OTHER): Payer: 59 | Admitting: Family Medicine

## 2022-05-11 VITALS — BP 115/73 | HR 93 | Ht 60.0 in | Wt 161.4 lb

## 2022-05-11 DIAGNOSIS — Z0000 Encounter for general adult medical examination without abnormal findings: Secondary | ICD-10-CM

## 2022-05-11 NOTE — Patient Instructions (Signed)
Preventive Care 40-59 Years Old, Female Preventive care refers to lifestyle choices and visits with your health care provider that can promote health and wellness. Preventive care visits are also called wellness exams. What can I expect for my preventive care visit? Counseling Your health care provider may ask you questions about your: Medical history, including: Past medical problems. Family medical history. Pregnancy history. Current health, including: Menstrual cycle. Method of birth control. Emotional well-being. Home life and relationship well-being. Sexual activity and sexual health. Lifestyle, including: Alcohol, nicotine or tobacco, and drug use. Access to firearms. Diet, exercise, and sleep habits. Work and work environment. Sunscreen use. Safety issues such as seatbelt and bike helmet use. Physical exam Your health care provider will check your: Height and weight. These may be used to calculate your BMI (body mass index). BMI is a measurement that tells if you are at a healthy weight. Waist circumference. This measures the distance around your waistline. This measurement also tells if you are at a healthy weight and may help predict your risk of certain diseases, such as type 2 diabetes and high blood pressure. Heart rate and blood pressure. Body temperature. Skin for abnormal spots. What immunizations do I need?  Vaccines are usually given at various ages, according to a schedule. Your health care provider will recommend vaccines for you based on your age, medical history, and lifestyle or other factors, such as travel or where you work. What tests do I need? Screening Your health care provider may recommend screening tests for certain conditions. This may include: Lipid and cholesterol levels. Diabetes screening. This is done by checking your blood sugar (glucose) after you have not eaten for a while (fasting). Pelvic exam and Pap test. Hepatitis B test. Hepatitis C  test. HIV (human immunodeficiency virus) test. STI (sexually transmitted infection) testing, if you are at risk. Lung cancer screening. Colorectal cancer screening. Mammogram. Talk with your health care provider about when you should start having regular mammograms. This may depend on whether you have a family history of breast cancer. BRCA-related cancer screening. This may be done if you have a family history of breast, ovarian, tubal, or peritoneal cancers. Bone density scan. This is done to screen for osteoporosis. Talk with your health care provider about your test results, treatment options, and if necessary, the need for more tests. Follow these instructions at home: Eating and drinking  Eat a diet that includes fresh fruits and vegetables, whole grains, lean protein, and low-fat dairy products. Take vitamin and mineral supplements as recommended by your health care provider. Do not drink alcohol if: Your health care provider tells you not to drink. You are pregnant, may be pregnant, or are planning to become pregnant. If you drink alcohol: Limit how much you have to 0-1 drink a day. Know how much alcohol is in your drink. In the [place] one drink equals one 12 oz bottle of beer (355 mL), one 5 oz glass of wine (148 mL), or one 1 oz glass of hard liquor (44 mL). Lifestyle Brush your teeth every morning and night with fluoride toothpaste. Floss one time each day. Exercise for at least 30 minutes 5 or more days each week. Do not use any products that contain nicotine or tobacco. These products include cigarettes, chewing tobacco, and vaping devices, such as e-cigarettes. If you need help quitting, ask your health care provider. Do not use drugs. If you are sexually active, practice safe sex. Use a condom or other form of protection to   prevent STIs. If you do not wish to become pregnant, use a form of birth control. If you plan to become pregnant, see your health care provider for a  prepregnancy visit. Take aspirin only as told by your health care provider. Make sure that you understand how much to take and what form to take. Work with your health care provider to find out whether it is safe and beneficial for you to take aspirin daily. Find healthy ways to manage stress, such as: Meditation, yoga, or listening to music. Journaling. Talking to a trusted person. Spending time with friends and family. Minimize exposure to UV radiation to reduce your risk of skin cancer. Safety Always wear your seat belt while driving or riding in a vehicle. Do not drive: If you have been drinking alcohol. Do not ride with someone who has been drinking. When you are tired or distracted. While texting. If you have been using any mind-altering substances or drugs. Wear a helmet and other protective equipment during sports activities. If you have firearms in your house, make sure you follow all gun safety procedures. Seek help if you have been physically or sexually abused. What's next? Visit your health care provider once a year for an annual wellness visit. Ask your health care provider how often you should have your eyes and teeth checked. Stay up to date on all vaccines. This information is not intended to replace advice given to you by your health care provider. Make sure you discuss any questions you have with your health care provider. Document Revised: 09/16/2020 Document Reviewed: 09/16/2020 Elsevier Patient Education  2023 Elsevier Inc.  

## 2022-05-11 NOTE — Progress Notes (Signed)
Patricia Arroyo - 57 y.o. female MRN [id number]  Date of birth: 12-Mar-1966  Subjective Chief Complaint  Patient presents with   Annual Exam    HPI Patricia Arroyo is a 57 year old female here today for annual exam.  She reports she is doing pretty well.  Long-term conditions are stable with current medications.  She had labs completed prior to visit which we reviewed in detail today.  She has been fairly active however does admit that her diet could be improved.  She is non-smoker.  Denies alcohol use at this time.  She does have regular dental care.  Review of Systems  Constitutional:  Negative for chills, fever, malaise/fatigue and weight loss.  HENT:  Negative for congestion, ear pain and sore throat.   Eyes:  Negative for blurred vision, double vision and pain.  Respiratory:  Negative for cough and shortness of breath.   Cardiovascular:  Negative for chest pain and palpitations.  Gastrointestinal:  Negative for abdominal pain, blood in stool, constipation, heartburn and nausea.  Genitourinary:  Negative for dysuria and urgency.  Musculoskeletal:  Negative for joint pain and myalgias.  Neurological:  Negative for dizziness and headaches.  Endo/Heme/Allergies:  Does not bruise/bleed easily.  Psychiatric/Behavioral:  Negative for depression. The patient is not nervous/anxious and does not have insomnia.     Allergies  Allergen Reactions   Adhesive [Tape] Hives and Other (See Comments)    Pt states that she is allergic to adhesive on Band-Aids.   Banana     Burning sensation on tongue   Diflucan [Fluconazole] Hives   Other Other (See Comments) and Hives    Pt states that she is allergic to adhesive on Band-Aids.   Penicillins Rash   Sulfa Antibiotics Rash    Past Medical History:  Diagnosis Date   ADHD (attention deficit hyperactivity disorder)    Anxiety    Carpal tunnel syndrome    Depression    h/o---no meds now   Diabetes mellitus without complication (HCC)     GERD (gastroesophageal reflux disease)    Hypertension    IBS (irritable bowel syndrome)    Varicose veins     Past Surgical History:  Procedure Laterality Date   ABDOMINOPLASTY  2020   ANTERIOR AND POSTERIOR REPAIR  03/10/2011   Procedure: ANTERIOR (CYSTOCELE) AND POSTERIOR REPAIR (RECTOCELE);  Surgeon: Luz Lex, MD;  Location: [facility];  Service: Gynecology;  Laterality: N/A;  with Sacrospinous Ligament Suspension   BREAST REDUCTION SURGERY Bilateral 2020   CARPAL TUNNEL RELEASE Bilateral 04/01/2013   Procedure: BILATERAL CARPAL TUNNEL RELEASE;  Surgeon: Tennis Must, MD;  Location: [facility];  Service: Orthopedics;  Laterality: Bilateral;   COLONOSCOPY     DILATION AND CURETTAGE OF UTERUS     FOOT FASCIOTOMY  2005   both feet   LAPAROSCOPIC ASSISTED VAGINAL HYSTERECTOMY  03/10/2011   Procedure: LAPAROSCOPIC ASSISTED VAGINAL HYSTERECTOMY;  Surgeon: Luz Lex, MD;  Location: [facility];  Service: Gynecology;  Laterality: N/A;   NOVASURE ABLATION     SALPINGOOPHORECTOMY  03/10/2011   Procedure: SALPINGO OOPHERECTOMY;  Surgeon: Luz Lex, MD;  Location: [facility];  Service: Gynecology;  Laterality: Bilateral;   WISDOM TOOTH EXTRACTION      Social History   Socioeconomic History   Marital status: Married    Spouse name: Not on file   Number of children: Not on file   Years of education: Not on file   Highest education level: Not on  file  Occupational History   Not on file  Tobacco Use   Smoking status: Never   Smokeless tobacco: Never  Vaping Use   Vaping Use: Never used  Substance and Sexual Activity   Alcohol use: No    Alcohol/week: 0.0 standard drinks of alcohol    Comment: occasionally   Drug use: No   Sexual activity: Yes    Partners: Male    Birth control/protection: Post-menopausal  Other Topics Concern   Not on file  Social History Narrative   Not on file   Social Determinants of Health   Financial Resource Strain: Not on file  Food  Insecurity: Not on file  Transportation Needs: Not on file  Physical Activity: Not on file  Stress: Not on file  Social Connections: Not on file    Family History  Problem Relation Age of Onset   Cancer Mother    Hypertension Mother    Stroke Mother    Ovarian cancer Mother    Diabetes Father    Heart attack Father    Stroke Father    Diabetes Sister    Ovarian cancer Sister     Health Maintenance  Topic Date Due   Zoster Vaccines- Shingrix (1 of 2) 08/09/2022 (Originally 06/17/2013)   MAMMOGRAM  05/12/2023 (Originally 03/04/2022)   OPHTHALMOLOGY EXAM  08/04/2023 (Originally 08/02/2020)   HEMOGLOBIN A1C  10/19/2022   FOOT EXAM  11/13/2022   Diabetic kidney evaluation - eGFR measurement  04/21/2023   Diabetic kidney evaluation - Urine ACR  04/21/2023   COLONOSCOPY (Pts 45-59yrs Insurance coverage will need to be confirmed)  01/17/2024   DTaP/Tdap/Td (2 - Td or Tdap) 10/05/2027   INFLUENZA VACCINE  Completed   COVID-19 Vaccine  Completed   Hepatitis C Screening  Completed   HIV Screening  Completed   HPV VACCINES  Aged Out     ----------------------------------------------------------------------------------------------------------------------------------------------------------------------------------------------------------------- Physical Exam BP 115/73 (BP Location: Left Arm, Patient Position: Sitting, Cuff Size: Normal)   Pulse 93   Ht 5' (1.524 m)   Wt 161 lb 6.4 oz (73.2 kg)   SpO2 97%   BMI 31.52 kg/m   Physical Exam Constitutional:      General: She is not in acute distress. HENT:     Head: Normocephalic and atraumatic.     Right Ear: Tympanic membrane and ear canal normal.     Left Ear: Tympanic membrane and ear canal normal.     Nose: Nose normal.  Eyes:     General: No scleral icterus.    Conjunctiva/sclera: Conjunctivae normal.  Neck:     Thyroid: No thyromegaly.  Cardiovascular:     Rate and Rhythm: Normal rate and regular rhythm.     Heart  sounds: Normal heart sounds.  Pulmonary:     Effort: Pulmonary effort is normal.     Breath sounds: Normal breath sounds.  Abdominal:     General: Bowel sounds are normal. There is no distension.     Palpations: Abdomen is soft.     Tenderness: There is no abdominal tenderness. There is no guarding.  Musculoskeletal:        General: Normal range of motion.     Cervical back: Normal range of motion and neck supple.  Lymphadenopathy:     Cervical: No cervical adenopathy.  Skin:    General: Skin is warm and dry.     Findings: No rash.  Neurological:     General: No focal deficit present.     Mental  Status: She is alert and oriented to person, place, and time.     Cranial Nerves: No cranial nerve deficit.     Coordination: Coordination normal.  Psychiatric:        Mood and Affect: Mood normal.        Behavior: Behavior normal.     ------------------------------------------------------------------------------------------------------------------------------------------------------------------------------------------------------------------- Assessment and Plan  Well adult exam Well adult Labs reviewed with her today. Immunizations: Up-to-date Anticipatory guidance/risk factor reduction: Recommendations per AVS.   No orders of the defined types were placed in this encounter.   No follow-ups on file.    This visit occurred during the SARS-CoV-2 public health emergency.  Safety protocols were in place, including screening questions prior to the visit, additional usage of staff PPE, and extensive cleaning of exam room while observing appropriate contact time as indicated for disinfecting solutions.

## 2022-05-11 NOTE — Assessment & Plan Note (Signed)
Well adult Labs reviewed with her today. Immunizations: Up-to-date Anticipatory guidance/risk factor reduction: Recommendations per AVS.

## 2022-06-09 ENCOUNTER — Other Ambulatory Visit: Payer: Self-pay | Admitting: Family Medicine

## 2022-06-10 ENCOUNTER — Telehealth: Payer: Self-pay | Admitting: Family Medicine

## 2022-06-10 NOTE — Telephone Encounter (Signed)
Pt called requesting refill on her Lisinopril. She saw Dr. Rodman Key on 2/7.

## 2022-06-13 NOTE — Telephone Encounter (Signed)
Refill was sent on 06/10/22.

## 2022-06-16 ENCOUNTER — Ambulatory Visit: Payer: 59 | Admitting: Sports Medicine

## 2022-06-23 ENCOUNTER — Ambulatory Visit (INDEPENDENT_AMBULATORY_CARE_PROVIDER_SITE_OTHER): Payer: 59

## 2022-06-23 ENCOUNTER — Ambulatory Visit (INDEPENDENT_AMBULATORY_CARE_PROVIDER_SITE_OTHER): Payer: 59 | Admitting: Sports Medicine

## 2022-06-23 DIAGNOSIS — M7071 Other bursitis of hip, right hip: Secondary | ICD-10-CM | POA: Diagnosis not present

## 2022-06-23 DIAGNOSIS — M7072 Other bursitis of hip, left hip: Secondary | ICD-10-CM

## 2022-06-23 DIAGNOSIS — M7070 Other bursitis of hip, unspecified hip: Secondary | ICD-10-CM | POA: Diagnosis not present

## 2022-06-23 DIAGNOSIS — M7918 Myalgia, other site: Secondary | ICD-10-CM | POA: Insufficient documentation

## 2022-06-23 MED ORDER — PREDNISONE 50 MG PO TABS: ORAL_TABLET | ORAL | 0 refills | Status: DC

## 2022-06-23 NOTE — Assessment & Plan Note (Signed)
Pleasant 57 year old female, her job involves sitting for long periods of time, she has developed increasing pain in the buttock bilaterally left worse than right, she localizes the pain over the ischial tuberosity, I am able to reproduce the pain with palpation and resisted flexion of the knee. We will do a few days of prednisone, pelvic x-rays, ischial bursitis at home physical therapy, she will get some up created cushioning at work. Return to see me in 4 to 6 weeks, ischial bursa injection if not better.

## 2022-06-23 NOTE — Progress Notes (Signed)
    Procedures performed today:    None.  Independent interpretation of notes and tests performed by another provider:   None.  Brief History, Exam, Impression, and Recommendations:    Ischial bursitis, bilateral Pleasant 57 year old female, her job involves sitting for long periods of time, she has developed increasing pain in the buttock bilaterally left worse than right, she localizes the pain over the ischial tuberosity, I am able to reproduce the pain with palpation and resisted flexion of the knee. We will do a few days of prednisone, pelvic x-rays, ischial bursitis at home physical therapy, she will get some up created cushioning at work. Return to see me in 4 to 6 weeks, ischial bursa injection if not better.    ____________________________________________ Gwen Her. Dianah Field, M.D., ABFM., CAQSM., AME. Primary Care and Sports Medicine  [facility]  Adjunct Professor of [place] of [facility] [employer]  Risk manager

## 2022-06-28 IMAGING — CT CT RENAL STONE PROTOCOL
2 of 4 series · 15 of 46 positions shown, 17 images · non-contrast
Comparison: Abdominal radiograph March 17, 2020

CLINICAL DATA: Now resolved but 3 weeks ago experienced lower
abdominal pain and hematuria.

EXAM:
CT ABDOMEN AND PELVIS WITHOUT CONTRAST
TECHNIQUE: Multidetector CT imaging of the abdomen and pelvis was performed
following the standard protocol without IV contrast.

[Series 2: axial st · axial · 0.84mm/px · z∈[-466,-80]mm · 12 of 89 slices shown, 14 images]
[im 8/89  soft-tissue]
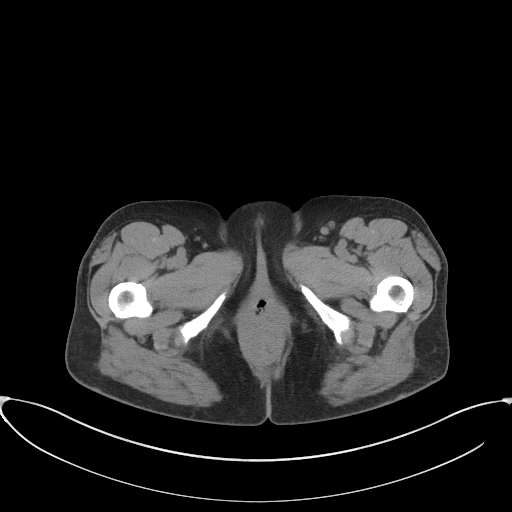
[im 8/89  bone]
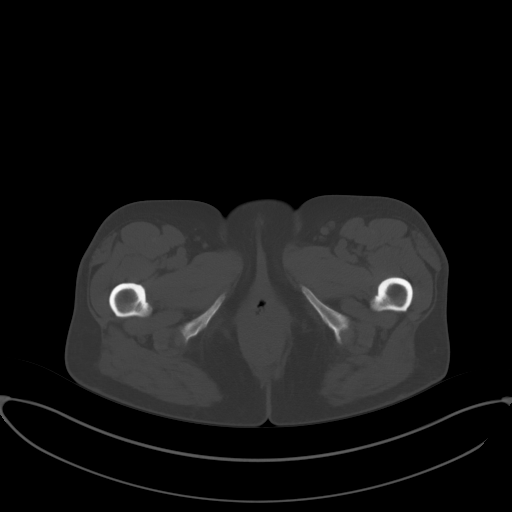
[im 15/89  soft-tissue]
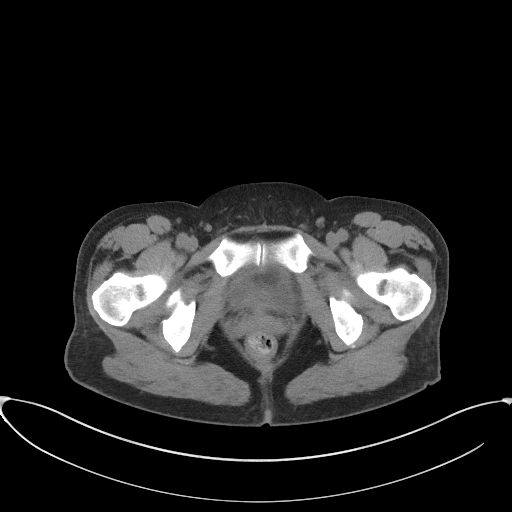
[im 22/89  soft-tissue]
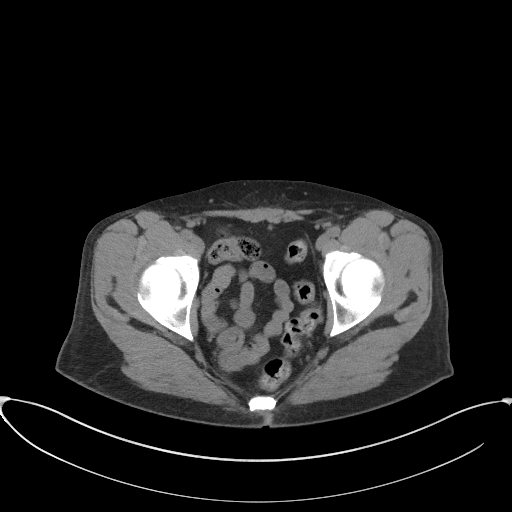
[im 29/89  soft-tissue]
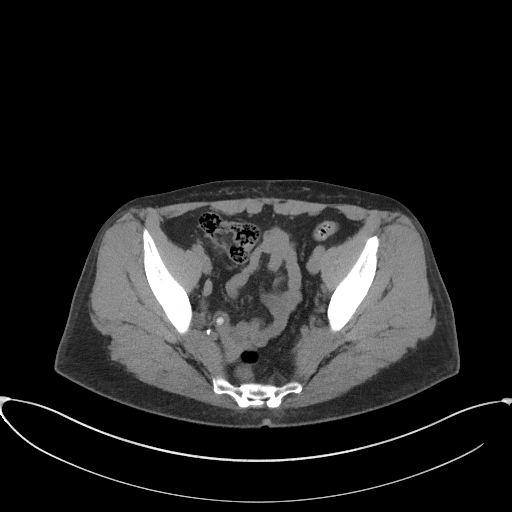
[im 36/89  soft-tissue]
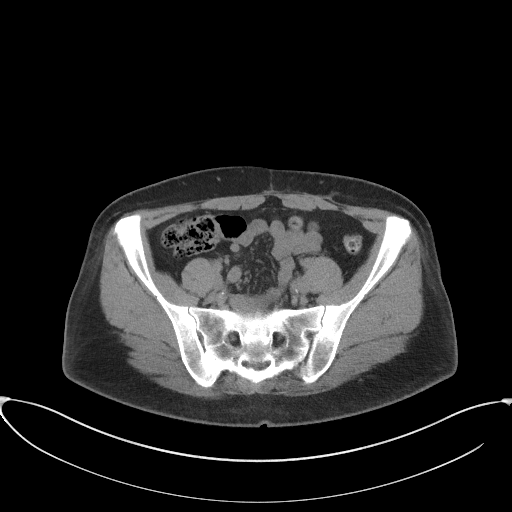
[im 43/89  soft-tissue]
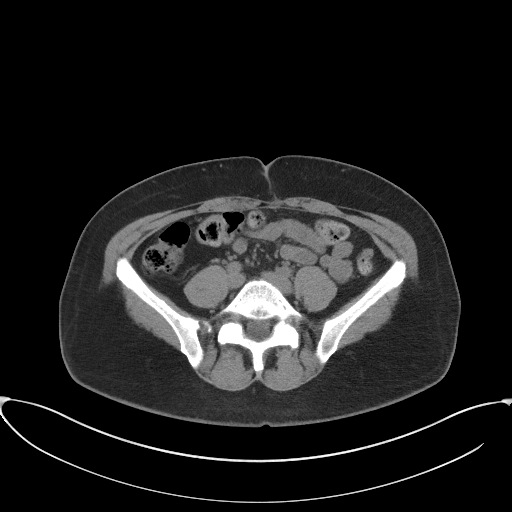
[im 50/89  soft-tissue]
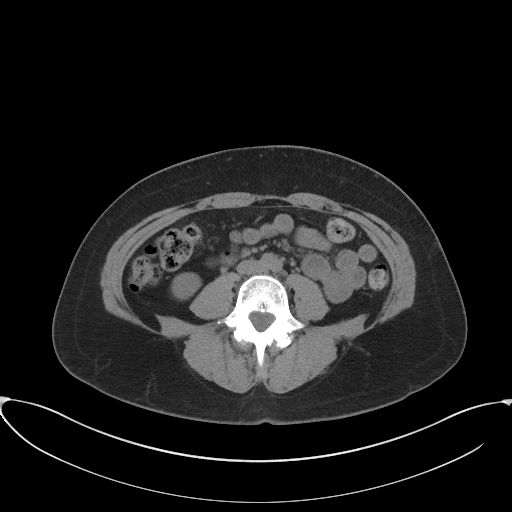
[im 57/89  soft-tissue]
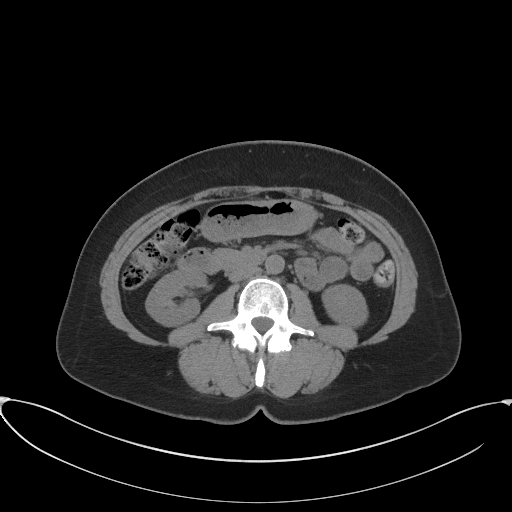
[im 64/89  soft-tissue]
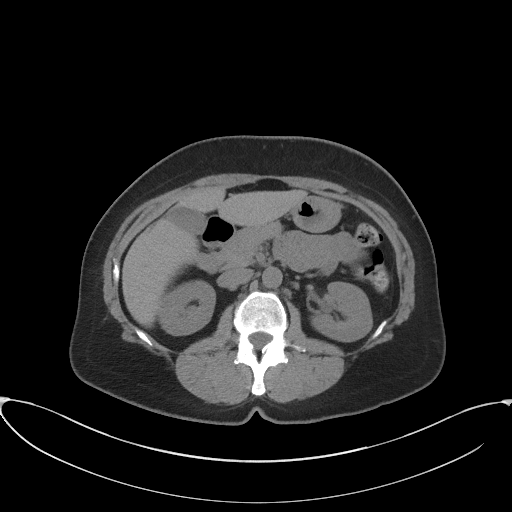
[im 64/89  bone]
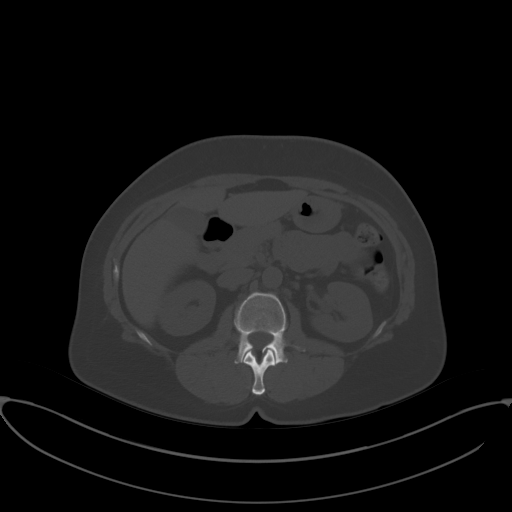
[im 71/89  soft-tissue]
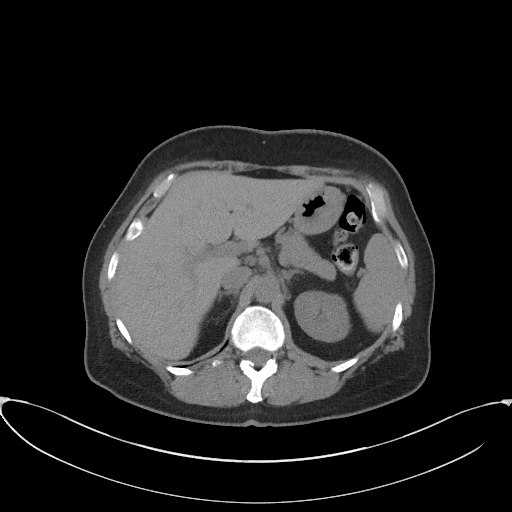
[im 78/89  soft-tissue]
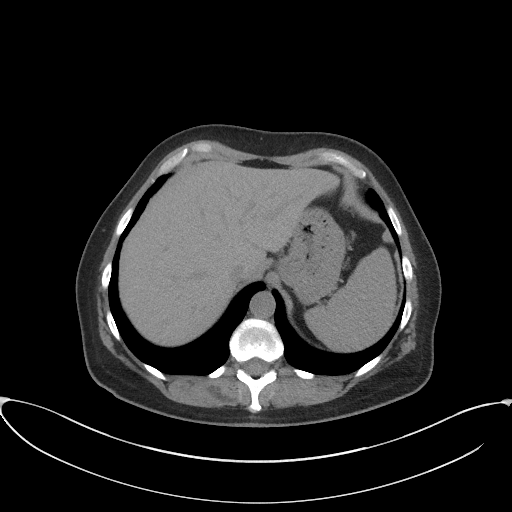
[im 85/89  soft-tissue]
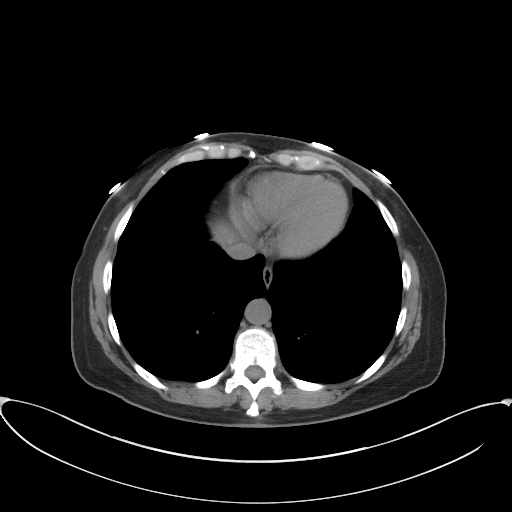

[Series 5: coronal st · coronal · 0.70mm/px · 3 of 84 slices shown]
[im 28/84  soft-tissue]
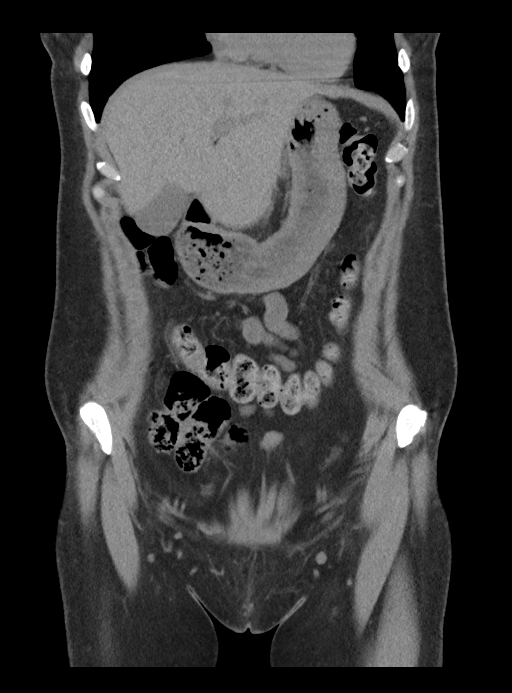
[im 37/84  soft-tissue]
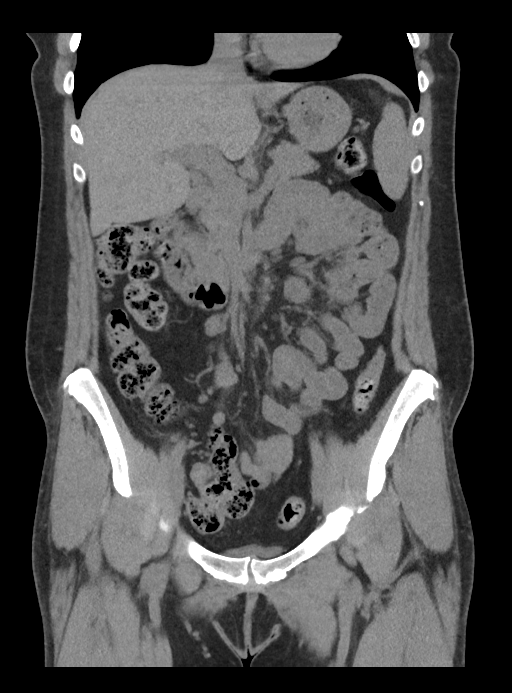
[im 47/84  soft-tissue]
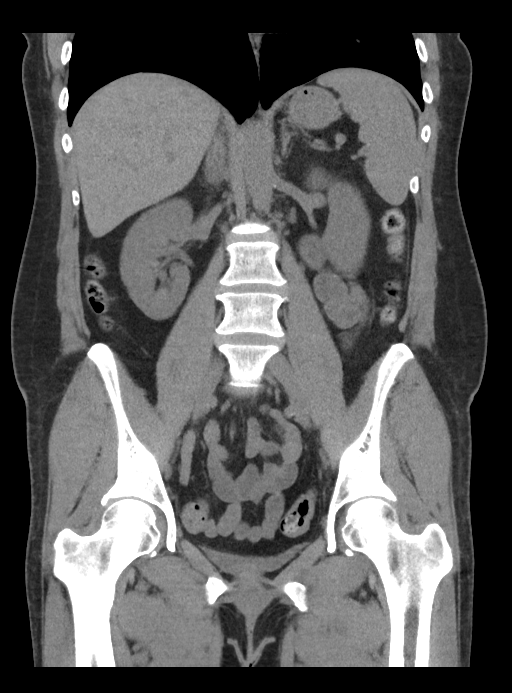

[15 of 46 positions shown; findings below may reference images not displayed]

FINDINGS: Lower chest: No acute abnormality. Normal size heart. No pericardial
effusion.

Hepatobiliary: No focal liver abnormality is seen. No gallstones,
gallbladder wall thickening, or biliary dilatation.

Pancreas: Unremarkable. No pancreatic ductal dilatation or
surrounding inflammatory changes.

Spleen: Normal in size without focal abnormality.

Adrenals/Urinary Tract: Adrenal glands are unremarkable. Kidneys are
normal, without renal calculi, focal lesion, or hydronephrosis.
Calcification to left of the L3-4 interspace corresponds with
calcified aortic atherosclerosis. Bladder is predominantly
decompressed but otherwise unremarkable.

Stomach/Bowel: Stomach is within normal limits. Appendix and
terminal ileum appear normal. Small volume of formed stool
throughout the colon. Adjacent to the sigmoid colon and just
superior to the urinary bladder there is a small, approximately
cm, oval pericolonic fat nodule with hyperdense ring, but without
significant adjacent inflammation (series 6, image 63 and series 5,
image 43), most consistent with sequela of epiploic appendagitis. No
evidence of bowel wall thickening, distention, or inflammatory
changes.

Vascular/Lymphatic: Aortic atherosclerosis. No enlarged abdominal or
pelvic lymph nodes.

Reproductive: Unremarkable

Other: No abdominopelvic ascites

Musculoskeletal: Multilevel degenerative changes spine. No acute
osseous abnormality.
IMPRESSION: 1. Small oval fat nodule with hyperdense ring adjacent to the
sigmoid colon and just superior to the urinary bladder, without
adjacent inflammation. Findings are most consistent with the sequela
of recent benign epiploic appendagitis, which given its location is
likely the etiology of patient's lower abdominal pain and hematuria.
2. Aortic atherosclerosis.

Aortic Atherosclerosis ([id number]).

## 2022-07-20 ENCOUNTER — Telehealth: Payer: Self-pay | Admitting: Sports Medicine

## 2022-07-20 NOTE — Telephone Encounter (Signed)
Patient called and wants to know if she can sit on a padded pillow in chair to get her hair done Friday after her appointment for injections? Patient states her anniversary is coming up. Please advise.  Okay to send [employer].

## 2022-07-22 ENCOUNTER — Other Ambulatory Visit (INDEPENDENT_AMBULATORY_CARE_PROVIDER_SITE_OTHER): Payer: 59

## 2022-07-22 ENCOUNTER — Ambulatory Visit: Payer: 59 | Admitting: Sports Medicine

## 2022-07-22 DIAGNOSIS — M7918 Myalgia, other site: Secondary | ICD-10-CM

## 2022-07-22 NOTE — Assessment & Plan Note (Addendum)
Pleasant 57 year old female, increasing bilateral buttock pain, MRI lumbar spine without significant pathology. Today we did bilateral ischial bursa injections. She will return to see me in about 6 weeks.

## 2022-07-22 NOTE — Progress Notes (Signed)
    Procedures performed today:    Procedure: Real-time Ultrasound Guided injection of the left ischial bursa sheath Device: Samsung HS60  Verbal informed consent obtained.  Time-out conducted.  Noted no overlying erythema, induration, or other signs of local infection.  Skin prepped in a sterile fashion.  Local anesthesia: Topical Ethyl chloride.  With sterile technique and under real time ultrasound guidance: Noted normal-appearing ischial bursa 1 cc Kenalog 40, 2 cc lidocaine, 2 cc bupivacaine injected easily Completed without difficulty  Advised to call if fevers/chills, erythema, induration, drainage, or persistent bleeding.  Images permanently stored and available for review in PACS.  Impression: Technically successful ultrasound guided injection.  Procedure: Real-time Ultrasound Guided injection of the right ischial bursa sheath Device: Samsung HS60  Verbal informed consent obtained.  Time-out conducted.  Noted no overlying erythema, induration, or other signs of local infection.  Skin prepped in a sterile fashion.  Local anesthesia: Topical Ethyl chloride.  With sterile technique and under real time ultrasound guidance: Noted normal-appearing ischial bursa 1 cc Kenalog 40, 2 cc lidocaine, 2 cc bupivacaine injected easily Completed without difficulty  Advised to call if fevers/chills, erythema, induration, drainage, or persistent bleeding.  Images permanently stored and available for review in PACS.  Impression: Technically successful ultrasound guided injection.  Independent interpretation of notes and tests performed by another provider:   None.  Brief History, Exam, Impression, and Recommendations:    Bilateral buttock pain Pleasant 57 year old female, increasing bilateral buttock pain, MRI lumbar spine without significant pathology. Today we did bilateral ischial bursa injections. She will return to see me in about 6  weeks.    ____________________________________________ [name]. Benjamin Stain, M.D., ABFM., CAQSM., AME. Primary Care and Sports Medicine [place] [facility]  Adjunct Professor of Family Medicine  [place] of [facility] [employer]  Restaurant manager, fast food

## 2022-07-28 ENCOUNTER — Other Ambulatory Visit: Payer: Self-pay | Admitting: Family Medicine

## 2022-07-28 DIAGNOSIS — M5412 Radiculopathy, cervical region: Secondary | ICD-10-CM

## 2022-08-04 ENCOUNTER — Ambulatory Visit: Payer: 59 | Admitting: Sports Medicine

## 2022-08-15 LAB — HM MAMMOGRAPHY

## 2022-08-16 LAB — HM PAP SMEAR

## 2022-08-24 ENCOUNTER — Other Ambulatory Visit: Payer: Self-pay | Admitting: Family Medicine

## 2022-09-02 ENCOUNTER — Ambulatory Visit: Payer: 59 | Admitting: Sports Medicine

## 2022-09-02 DIAGNOSIS — M47817 Spondylosis without myelopathy or radiculopathy, lumbosacral region: Secondary | ICD-10-CM | POA: Diagnosis not present

## 2022-09-02 DIAGNOSIS — M47812 Spondylosis without myelopathy or radiculopathy, cervical region: Secondary | ICD-10-CM | POA: Insufficient documentation

## 2022-09-02 DIAGNOSIS — S161XXA Strain of muscle, fascia and tendon at neck level, initial encounter: Secondary | ICD-10-CM | POA: Diagnosis not present

## 2022-09-02 DIAGNOSIS — M7918 Myalgia, other site: Secondary | ICD-10-CM | POA: Diagnosis not present

## 2022-09-02 NOTE — Progress Notes (Signed)
    Procedures performed today:    None.  Independent interpretation of notes and tests performed by another provider:   MRI personally reviewed, disks look good, she does have multilevel L3-S1 facet joint arthritis.  Brief History, Exam, Impression, and Recommendations:    Lumbosacral spondylosis Patricia Arroyo returns, she is a pleasant 57 year old female, continued bilateral axial and buttock pain, she has multilevel facet arthritis on MRI, she also has some right SI joint subchondral sclerosis on MRI. At the last visit we did bilateral ischial bursa injections, this was where she was localizing her pain, unfortunately this did not improve her symptoms, we will now target her multilevel lower facets, she would prefer Dr. Alfredo Batty at [facility] imaging, return to see me approximately 6 weeks after the injections.  Bilateral buttock pain Please see below, bilateral buttock pain, we did bilateral ischial bursa injections with ultrasound guidance, she did not improve, we will go ahead and proceed with pelvic MRI for further evaluation.   Cervical strain Recent episode of acute neck pain radiating up to the occiput, improved rapidly with topical heat and cryotherapy. Likely cervical strain, adding some home conditioning, return as needed for this.    ____________________________________________ [name]. Benjamin Stain, M.D., ABFM., CAQSM., AME. Primary Care and Sports Medicine [place] [facility]  Adjunct Professor of Family Medicine  [place] [employer] [facility] [employer]  Restaurant manager, fast food

## 2022-09-02 NOTE — Assessment & Plan Note (Signed)
Please see below, bilateral buttock pain, we did bilateral ischial bursa injections with ultrasound guidance, she did not improve, we will go ahead and proceed with pelvic MRI for further evaluation.

## 2022-09-02 NOTE — Assessment & Plan Note (Signed)
Patricia Arroyo returns, she is a pleasant 57 year old female, continued bilateral axial and buttock pain, she has multilevel facet arthritis on MRI, she also has some right SI joint subchondral sclerosis on MRI. At the last visit we did bilateral ischial bursa injections, this was where she was localizing her pain, unfortunately this did not improve her symptoms, we will now target her multilevel lower facets, she would prefer Dr. Alfredo Batty at [facility] imaging, return to see me approximately 6 weeks after the injections.

## 2022-09-02 NOTE — Assessment & Plan Note (Signed)
Recent episode of acute neck pain radiating up to the occiput, improved rapidly with topical heat and cryotherapy. Likely cervical strain, adding some home conditioning, return as needed for this.

## 2022-09-19 ENCOUNTER — Ambulatory Visit (INDEPENDENT_AMBULATORY_CARE_PROVIDER_SITE_OTHER): Payer: 59

## 2022-09-19 DIAGNOSIS — M7918 Myalgia, other site: Secondary | ICD-10-CM | POA: Diagnosis not present

## 2022-09-29 ENCOUNTER — Other Ambulatory Visit: Payer: 59

## 2022-10-03 ENCOUNTER — Ambulatory Visit: Payer: 59 | Admitting: Sports Medicine

## 2022-10-03 DIAGNOSIS — M47817 Spondylosis without myelopathy or radiculopathy, lumbosacral region: Secondary | ICD-10-CM | POA: Diagnosis not present

## 2022-10-03 DIAGNOSIS — M7918 Myalgia, other site: Secondary | ICD-10-CM | POA: Diagnosis not present

## 2022-10-03 NOTE — Assessment & Plan Note (Signed)
As below did not respond to bilateral ischial bursa injections, proceeding with intervention into the facets.

## 2022-10-03 NOTE — Progress Notes (Signed)
    Procedures performed today:    None.  Independent interpretation of notes and tests performed by another provider:   None.  Brief History, Exam, Impression, and Recommendations:    Lumbosacral spondylosis Bilateral buttock pain, known multilevel lumbar facet arthritis as well as right SI joint subchondral sclerosis on MRI. We did bilateral ischial bursa injections and she did not respond so we proceeded with ordering multilevel lower lumbar facet injections with Dr. Alfredo Batty, she has not gotten these injections yet, she will get them done and follow-up in 4 to 6 weeks.  Bilateral buttock pain As below did not respond to bilateral ischial bursa injections, proceeding with intervention into the facets.  I spent 30 minutes of total time managing this patient today, this includes chart review, face to face, and non-face to face time.  ____________________________________________ [name]. Benjamin Stain, M.D., ABFM., CAQSM., AME. Primary Care and Sports Medicine [place] [facility]  Adjunct Professor of Family Medicine  [place] [employer] [facility] of Medicine  Restaurant manager, fast food

## 2022-10-03 NOTE — Assessment & Plan Note (Signed)
Bilateral buttock pain, known multilevel lumbar facet arthritis as well as right SI joint subchondral sclerosis on MRI. We did bilateral ischial bursa injections and she did not respond so we proceeded with ordering multilevel lower lumbar facet injections with Dr. Alfredo Batty, she has not gotten these injections yet, she will get them done and follow-up in 4 to 6 weeks.

## 2022-10-17 ENCOUNTER — Ambulatory Visit: Payer: 59 | Admitting: Family Medicine

## 2022-11-04 ENCOUNTER — Encounter: Payer: Self-pay | Admitting: Sports Medicine

## 2022-11-08 ENCOUNTER — Other Ambulatory Visit: Payer: 59

## 2022-11-24 NOTE — Discharge Instructions (Signed)

## 2022-11-25 ENCOUNTER — Ambulatory Visit
Admission: RE | Admit: 2022-11-25 | Discharge: 2022-11-25 | Disposition: A | Discharge status: Home / Self Care | Payer: 59 | Source: Ambulatory Visit | Attending: Sports Medicine | Admitting: Sports Medicine

## 2022-11-25 ENCOUNTER — Other Ambulatory Visit: Payer: Self-pay | Admitting: Sports Medicine

## 2022-11-25 DIAGNOSIS — M47817 Spondylosis without myelopathy or radiculopathy, lumbosacral region: Secondary | ICD-10-CM

## 2022-11-25 MED ORDER — METHYLPREDNISOLONE ACETATE 40 MG/ML INJ SUSP (RADIOLOG
80.0000 mg | Freq: Once | INTRAMUSCULAR | Status: AC
  Administered 2022-11-25: 80 mg via INTRA_ARTICULAR

## 2022-11-25 MED ORDER — IOPAMIDOL (ISOVUE-M 200) INJECTION 41%
1.0000 mL | Freq: Once | INTRAMUSCULAR | Status: AC
  Administered 2022-11-25: 1 mL via INTRA_ARTICULAR

## 2022-12-01 ENCOUNTER — Other Ambulatory Visit: Payer: Self-pay | Admitting: Family Medicine

## 2022-12-01 DIAGNOSIS — M5412 Radiculopathy, cervical region: Secondary | ICD-10-CM

## 2022-12-01 DIAGNOSIS — I10 Essential (primary) hypertension: Secondary | ICD-10-CM

## 2022-12-01 NOTE — Telephone Encounter (Signed)
Pls contact the patient to schedule med refill appt with Dr. Ashley Royalty for HTN. Sending 30 day refill to hold. Thanks

## 2022-12-01 NOTE — Telephone Encounter (Signed)
Called patient, patient would like clarification for why she'd need to come into office to ed refill, thanks.

## 2022-12-20 ENCOUNTER — Other Ambulatory Visit: Payer: Self-pay | Admitting: *Deleted

## 2022-12-20 DIAGNOSIS — I8390 Asymptomatic varicose veins of unspecified lower extremity: Secondary | ICD-10-CM

## 2022-12-21 ENCOUNTER — Ambulatory Visit (HOSPITAL_COMMUNITY)
Admission: RE | Admit: 2022-12-21 | Discharge: 2022-12-21 | Disposition: A | Discharge status: Home / Self Care | Payer: 59 | Source: Ambulatory Visit | Attending: Vascular Surgery | Admitting: Vascular Surgery

## 2022-12-21 DIAGNOSIS — I8390 Asymptomatic varicose veins of unspecified lower extremity: Secondary | ICD-10-CM | POA: Diagnosis present

## 2022-12-21 DIAGNOSIS — I8392 Asymptomatic varicose veins of left lower extremity: Secondary | ICD-10-CM | POA: Diagnosis not present

## 2022-12-23 ENCOUNTER — Encounter: Payer: Self-pay | Admitting: Vascular Surgery

## 2022-12-23 ENCOUNTER — Ambulatory Visit: Payer: 59 | Admitting: Vascular Surgery

## 2022-12-23 VITALS — BP 128/80 | HR 91 | Temp 98.0°F | Resp 16 | Ht 60.0 in | Wt 159.4 lb

## 2022-12-23 DIAGNOSIS — I8390 Asymptomatic varicose veins of unspecified lower extremity: Secondary | ICD-10-CM | POA: Diagnosis not present

## 2022-12-23 DIAGNOSIS — I8393 Asymptomatic varicose veins of bilateral lower extremities: Secondary | ICD-10-CM

## 2022-12-23 MED ORDER — MEDICAL COMPRESSION STOCKINGS MISC: 1.0000 [IU] | Freq: Every day | 4 refills | Status: AC

## 2022-12-23 NOTE — Progress Notes (Unsigned)
Office Note    CC: Bilateral lower extremity varicose veins Requesting Provider:  Everrett Coombe, DO  HPI: Patricia Arroyo is a 57 y.o. ([birth year]) female who presents at the request of Everrett Coombe, DO for evaluation of bilateral lower extremity varicose veins.  On exam, Tashayla was doing well.  She is well-known to our practice having undergone bilateral saphenous vein stripping, stab phlebectomy in 2016 with Dr. Hart Rochester.  She tolerated the procedure well.  Roughly 5 years ago, she stopped wearing compression stockings, and since that time she has noted worsening of bilateral lower extremity varicose veins.  Over the last several months, the veins have become increasingly painful, especially by days end.  She denies bleeding episodes, but does note a burning sensation accompanied with throbbing.  No previous history of DVT.   Past Medical History:  Diagnosis Date   ADHD (attention deficit hyperactivity disorder)    Anxiety    Carpal tunnel syndrome    Depression    h/o---no meds now   Diabetes mellitus without complication (HCC)    GERD (gastroesophageal reflux disease)    Hypertension    IBS (irritable bowel syndrome)    Varicose veins     Past Surgical History:  Procedure Laterality Date   ABDOMINOPLASTY  2020   ANTERIOR AND POSTERIOR REPAIR  03/10/2011   Procedure: ANTERIOR (CYSTOCELE) AND POSTERIOR REPAIR (RECTOCELE);  Surgeon: Turner Daniels, MD;  Location: [facility];  Service: Gynecology;  Laterality: N/A;  with Sacrospinous Ligament Suspension   BREAST REDUCTION SURGERY Bilateral 2020   CARPAL TUNNEL RELEASE Bilateral 04/01/2013   Procedure: BILATERAL CARPAL TUNNEL RELEASE;  Surgeon: Tami Ribas, MD;  Location:  SURGERY CENTER;  Service: Orthopedics;  Laterality: Bilateral;   COLONOSCOPY     DILATION AND CURETTAGE OF UTERUS     FOOT FASCIOTOMY  2005   both feet   LAPAROSCOPIC ASSISTED VAGINAL HYSTERECTOMY  03/10/2011   Procedure: LAPAROSCOPIC ASSISTED VAGINAL  HYSTERECTOMY;  Surgeon: Turner Daniels, MD;  Location: [facility];  Service: Gynecology;  Laterality: N/A;   NOVASURE ABLATION     SALPINGOOPHORECTOMY  03/10/2011   Procedure: SALPINGO OOPHERECTOMY;  Surgeon: Turner Daniels, MD;  Location: [facility];  Service: Gynecology;  Laterality: Bilateral;   WISDOM TOOTH EXTRACTION      Social History   Socioeconomic History   Marital status: Married    Spouse name: Not on file   Number of children: Not on file   Years of education: Not on file   Highest education level: Not on file  Occupational History   Not on file  Tobacco Use   Smoking status: Never   Smokeless tobacco: Never  Vaping Use   Vaping status: Never Used  Substance and Sexual Activity   Alcohol use: No    Alcohol/week: 0.0 standard drinks of alcohol    Comment: occasionally   Drug use: No   Sexual activity: Yes    Partners: Male    Birth control/protection: Post-menopausal  Other Topics Concern   Not on file  Social History Narrative   Not on file   Social Determinants of Health   Financial Resource Strain: Not on file  Food Insecurity: No Food Insecurity (11/05/2020)   Received from [facility], [facility]   Hunger Vital Sign    Worried About Running Out of Food in the Last Year: Never true    Ran Out of Food in the Last Year: Never true  Transportation Needs: Not on file  Physical  Activity: Not on file  Stress: Not on file  Social Connections: Unknown (08/13/2021)   Received from [facility], [facility]   Social Network    Social Network: Not on file  Intimate Partner Violence: Unknown (07/06/2021)   Received from [facility], [facility]   HITS    Physically Hurt: Not on file    Insult or Talk Down To: Not on file    Threaten Physical Harm: Not on file    Scream or Curse: Not on file   Family History  Problem Relation Age of Onset   Cancer Mother    Hypertension Mother    Stroke Mother    Ovarian cancer Mother    Diabetes Father    Heart attack  Father    Stroke Father    Diabetes Sister    Ovarian cancer Sister     Current Outpatient Medications  Medication Sig Dispense Refill   amphetamine-dextroamphetamine (ADDERALL XR) 15 MG 24 hr capsule Take 30 mg by mouth.      buPROPion (WELLBUTRIN XL) 300 MG 24 hr tablet bupropion HCl XL 300 mg 24 hr tablet, extended release     conjugated estrogens (PREMARIN) vaginal cream Premarin 0.625 mg/gram vaginal cream  apply to vaginal and vulva twice weekly     cyclobenzaprine (FLEXERIL) 5 MG tablet Take 0.5-2 tablets (2.5-10 mg total) by mouth 3 (three) times daily as needed for muscle spasms. 180 tablet 11   diazepam (VALIUM) 10 MG tablet Take 10 mg by mouth daily as needed for anxiety.     estradiol (ESTRACE) 1 MG tablet Take 1 mg by mouth daily.     etodolac (LODINE XL) 500 MG 24 hr tablet TAKE 1 TABLET (500 MG TOTAL) BY MOUTH DAILY. 90 tablet 0   lisinopril (ZESTRIL) 10 MG tablet TAKE 1 TABLET (10 MG TOTAL) BY MOUTH DAILY. NEEDS APPT FOR REFILLS. 30 tablet 0   omeprazole (PRILOSEC) 40 MG capsule TAKE 1 CAPSULE BY MOUTH EVERY DAY 90 capsule 1   triamterene-hydrochlorothiazide (MAXZIDE-25) 37.5-25 MG tablet TAKE 1 TABLET BY MOUTH EVERY DAY 90 tablet 3   diclofenac Sodium (VOLTAREN) 1 % GEL Apply 4 g topically 4 (four) times daily. To affected joint. 350 g 11   estradiol (ESTRACE) 2 MG tablet Take 1 tablet (2 mg total) by mouth daily. 30 tablet 12   predniSONE (DELTASONE) 50 MG tablet One tab PO daily for 5 days. 5 tablet 0   No current facility-administered medications for this visit.    Allergies  Allergen Reactions   Adhesive [Tape] Hives and Other (See Comments)    Pt states that she is allergic to adhesive on Band-Aids.   Banana     Burning sensation on tongue   Diflucan [Fluconazole] Hives   Other Other (See Comments) and Hives    Pt states that she is allergic to adhesive on Band-Aids.   Penicillins Rash   Sulfa Antibiotics Rash     REVIEW OF SYSTEMS:  [X]  denotes positive  finding, [ ]  denotes negative finding Cardiac  Comments:  Chest pain or chest pressure:    Shortness of breath upon exertion:    Short of breath when lying flat:    Irregular heart rhythm:        Vascular    Pain in calf, thigh, or hip brought on by ambulation:    Pain in feet at night that wakes you up from your sleep:     Blood clot in your veins:  Leg swelling:         Pulmonary    Oxygen at home:    Productive cough:     Wheezing:         Neurologic    Sudden weakness in arms or legs:     Sudden numbness in arms or legs:     Sudden onset of difficulty speaking or slurred speech:    Temporary loss of vision in one eye:     Problems with dizziness:         Gastrointestinal    Blood in stool:     Vomited blood:         Genitourinary    Burning when urinating:     Blood in urine:        Psychiatric    Major depression:         Hematologic    Bleeding problems:    Problems with blood clotting too easily:        Skin    Rashes or ulcers:        Constitutional    Fever or chills:      PHYSICAL EXAMINATION:  Vitals:   12/23/22 1546  BP: 128/80  Pulse: 91  Resp: 16  Temp: 98 F (36.7 C)  TempSrc: Temporal  SpO2: 94%  Weight: 159 lb 6.4 oz (72.3 kg)  Height: 5' (1.524 m)    General:  WDWN in NAD; vital signs documented above Gait: Not observed HENT: WNL, normocephalic Pulmonary: normal non-labored breathing , without Rales, rhonchi,  wheezing Cardiac: {Desc; regular/irreg:14544} HR, without  Murmurs {With/Without:20273} carotid bruit*** Abdomen: soft, NT, no masses Skin: {With/Without:20273} rashes Vascular Exam/Pulses:  Right Left  Radial {Exam; arterial pulse strength 0-4:30167} {Exam; arterial pulse strength 0-4:30167}  Ulnar {Exam; arterial pulse strength 0-4:30167} {Exam; arterial pulse strength 0-4:30167}  Femoral {Exam; arterial pulse strength 0-4:30167} {Exam; arterial pulse strength 0-4:30167}  Popliteal {Exam; arterial pulse strength  0-4:30167} {Exam; arterial pulse strength 0-4:30167}  DP {Exam; arterial pulse strength 0-4:30167} {Exam; arterial pulse strength 0-4:30167}  PT {Exam; arterial pulse strength 0-4:30167} {Exam; arterial pulse strength 0-4:30167}   Extremities: {With/Without:20273} ischemic changes, {With/Without:20273} Gangrene , {With/Without:20273} cellulitis; {With/Without:20273} open wounds;  Musculoskeletal: no muscle wasting or atrophy  Neurologic: A&O X 3;  No focal weakness or paresthesias are detected Psychiatric:  The pt has {Desc; normal/abnormal:11317::"Normal"} affect.   Non-Invasive Vascular Imaging:   +-------------+---------+------+----------+------------+-------------------  ----+  LEFT        Reflux NoReflux  Reflux  Diameter cmsComments                                         Yes     Time                                         +-------------+---------+------+----------+------------+-------------------  ----+  CFV                   yes  >1 second                                       +-------------+---------+------+----------+------------+-------------------  ----+  GSV at [facility]  yes   >500 ms      .652                              +-------------+---------+------+----------+------------+-------------------  ----+  GSV prox               yes   >500 ms      .645                              thigh                                                                       +-------------+---------+------+----------+------------+-------------------  ----+  GSV mid thigh                                     prior                                                                       ablation/stripping        +-------------+---------+------+----------+------------+-------------------  ----+  GSV dist                                          prior                     thigh                                              ablation/stripping        +-------------+---------+------+----------+------------+-------------------  ----+  GSV at knee                                       prior                                                                       ablation/stripping        +-------------+---------+------+----------+------------+-------------------  ----+  GSV prox calfno                           .114                              +-------------+---------+------+----------+------------+-------------------  ----+  SSV Pop Fossano                           .246                              +-------------+---------+------+----------+------------+-------------------  ----+  SSV prox calfno                           .169                              +-------------+---------+------+----------+------------+-------------------  ----+      ASSESSMENT/PLAN:: 57 y.o. female presenting with ***   ***   Victorino Sparrow, MD [facility] [phone]

## 2023-01-01 ENCOUNTER — Other Ambulatory Visit: Payer: Self-pay | Admitting: Family Medicine

## 2023-01-01 DIAGNOSIS — I10 Essential (primary) hypertension: Secondary | ICD-10-CM

## 2023-01-03 ENCOUNTER — Telehealth: Payer: Self-pay | Admitting: *Deleted

## 2023-01-03 NOTE — Telephone Encounter (Signed)
Tried twice to reach patient by telephone.  Patricia Arroyo telephone voice mail is full, and I am unable to leave voice message.  Mrs. Hillmann 3 month VV FU appointment with Dr. Myra Gianotti has been moved up to 03-27-2023 per patient's request.

## 2023-01-03 NOTE — Telephone Encounter (Signed)
Pls schedule pt with Dr. Ashley Royalty for HTN follow-up. Sending 15 tabs. Pt has been scheduling with Dr. Karie Schwalbe for Ortho (they do not apply towards HTN). Thanks

## 2023-01-03 NOTE — Telephone Encounter (Signed)
Called patient, no answer, VM full, thanks.

## 2023-01-05 ENCOUNTER — Telehealth: Payer: Self-pay | Admitting: Family Medicine

## 2023-01-05 NOTE — Telephone Encounter (Signed)
Called patient, VM full, will try again later, thanks.

## 2023-01-06 ENCOUNTER — Ambulatory Visit: Payer: 59 | Admitting: Sports Medicine

## 2023-01-06 ENCOUNTER — Encounter: Payer: Self-pay | Admitting: Sports Medicine

## 2023-01-06 DIAGNOSIS — M47817 Spondylosis without myelopathy or radiculopathy, lumbosacral region: Secondary | ICD-10-CM

## 2023-01-06 DIAGNOSIS — M5412 Radiculopathy, cervical region: Secondary | ICD-10-CM | POA: Diagnosis not present

## 2023-01-06 MED ORDER — ETODOLAC ER 500 MG PO TB24
500.0000 mg | ORAL_TABLET | Freq: Two times a day (BID) | ORAL | 3 refills | Status: DC
Start: 2023-01-06 — End: 2023-12-18

## 2023-01-06 NOTE — Telephone Encounter (Signed)
Patient returned phone call and states that she did not understand why she needed an appointment for HTN follow up.

## 2023-01-06 NOTE — Progress Notes (Signed)
    Procedures performed today:    None.  Independent interpretation of notes and tests performed by another provider:   None.  Brief History, Exam, Impression, and Recommendations:    Lumbosacral spondylosis Patricia Arroyo returns, she is a pleasant 58 year old female, she has known multilevel lumbar facet arthritis, as well as right SI joint subchondral sclerosis on MRI, she had bilateral ischial bursa injections that did not provide relief, she then had multilevel lower lumbar facet injections, right L3-5 and left L4-S1, she did report about 75% relief for several weeks. We will increase her Lodine to 1 tablet twice a day, max dose is 1000 daily milligrams. Due to her good relief we will proceed with medial branch blocks and potentially RFA, she does prefer Dr. Alfredo Arroyo.    ____________________________________________ [name]. Benjamin Stain, M.D., ABFM., CAQSM., AME. Primary Care and Sports Medicine [facility]  Adjunct Professor of Family Medicine  [place] [employer]  Restaurant manager, fast food

## 2023-01-06 NOTE — Assessment & Plan Note (Addendum)
Patricia Arroyo returns, she is a pleasant 57 year old female, she has known multilevel lumbar facet arthritis, as well as right SI joint subchondral sclerosis on MRI, she had bilateral ischial bursa injections that did not provide relief, she then had multilevel lower lumbar facet injections, right L3-5 and left L4-S1, she did report about 75% relief for several weeks. We will increase her Lodine to 1 tablet twice a day, max dose is 1000 daily milligrams. Due to her good relief we will proceed with medial branch blocks and potentially RFA, she does prefer Dr. Alfredo Batty.

## 2023-01-11 ENCOUNTER — Other Ambulatory Visit: Payer: Self-pay | Admitting: Family Medicine

## 2023-01-11 DIAGNOSIS — I10 Essential (primary) hypertension: Secondary | ICD-10-CM

## 2023-01-11 NOTE — Telephone Encounter (Signed)
Pt has not seen PCP since February 2023. HTN follow-up is due every 6 months. Labs are drawn to ensure good renal function. HTN follow-up is past due. No additional refills will be given without OV to PCP. Visits to Dr. Karie Schwalbe are ortho specific. Thanks

## 2023-01-20 ENCOUNTER — Encounter: Payer: Self-pay | Admitting: Sports Medicine

## 2023-01-20 ENCOUNTER — Other Ambulatory Visit: Payer: Self-pay | Admitting: Sports Medicine

## 2023-01-20 DIAGNOSIS — M47817 Spondylosis without myelopathy or radiculopathy, lumbosacral region: Secondary | ICD-10-CM

## 2023-01-25 NOTE — Discharge Instructions (Signed)
Medial Branch Block Discharge Instructions  Take over-the-counter and prescription medicines only as told by your health care provider.  Do not drive the day of your procedure  Return to your normal activities as told by your health care provider.   If injection site is sore you may ice the area for 20 minutes, 2-3 times a day.   Check your injection site every day for signs of infection. Check for: Redness, swelling, or pain. Fluid or blood. Warmth. Pus or a bad smell.  Please contact our office at [phone] if: You have a fever or chills. You have any signs of infection. You develop any numbness or weakness.   Thank you for visiting our office.  

## 2023-01-26 ENCOUNTER — Ambulatory Visit
Admission: RE | Admit: 2023-01-26 | Discharge: 2023-01-26 | Disposition: A | Discharge status: Home / Self Care | Payer: 59 | Source: Ambulatory Visit | Attending: Sports Medicine | Admitting: Sports Medicine

## 2023-01-26 ENCOUNTER — Other Ambulatory Visit: Payer: Self-pay | Admitting: Sports Medicine

## 2023-01-26 ENCOUNTER — Other Ambulatory Visit: Payer: Self-pay | Admitting: Family Medicine

## 2023-01-26 DIAGNOSIS — M47817 Spondylosis without myelopathy or radiculopathy, lumbosacral region: Secondary | ICD-10-CM

## 2023-01-26 DIAGNOSIS — I10 Essential (primary) hypertension: Secondary | ICD-10-CM

## 2023-01-26 MED ORDER — LISINOPRIL 10 MG PO TABS: ORAL_TABLET | ORAL | 0 refills | Status: DC

## 2023-01-28 ENCOUNTER — Other Ambulatory Visit: Payer: Self-pay | Admitting: Family Medicine

## 2023-01-30 ENCOUNTER — Encounter: Payer: Self-pay | Admitting: Sports Medicine

## 2023-02-01 ENCOUNTER — Other Ambulatory Visit: Payer: Self-pay | Admitting: Family Medicine

## 2023-02-01 NOTE — Telephone Encounter (Signed)
Pls contact pt to schedule with Dr. Ashley Royalty. Explain she needs to see her PCP for continued medication management. Her visits with Dr. Karie Schwalbe are strictly ortho. Sending 15 tab refill. Thanks

## 2023-02-01 NOTE — Telephone Encounter (Signed)
I called to schedule appointment and she asks why she needs to come in twice a year for medication check and she did not make an appointment she is doing well on her medication she stated

## 2023-02-16 NOTE — Discharge Instructions (Signed)
Radio Frequency Ablation Post Procedure Discharge Instructions ? ?May resume a regular diet and any medications that you routinely take (including pain medications). ?No driving day of procedure. ?Upon discharge go home and rest for at least 4 hours.  May use an ice pack as needed to injection sites on back. ?Remove bandades later, today. ? ? ? ?Please contact our office at [phone] for the following symptoms: ? ?Fever greater than 100 degrees ?Increased swelling, pain, or redness at injection site. ? ? ?Thank you for visiting [facility].  ?

## 2023-02-17 ENCOUNTER — Ambulatory Visit
Admission: RE | Admit: 2023-02-17 | Discharge: 2023-02-17 | Disposition: A | Discharge status: Home / Self Care | Payer: 59 | Source: Ambulatory Visit | Attending: Sports Medicine | Admitting: Sports Medicine

## 2023-02-17 ENCOUNTER — Other Ambulatory Visit: Payer: Self-pay | Admitting: Sports Medicine

## 2023-02-17 DIAGNOSIS — M47817 Spondylosis without myelopathy or radiculopathy, lumbosacral region: Secondary | ICD-10-CM

## 2023-02-17 MED ORDER — FENTANYL CITRATE PF 50 MCG/ML IJ SOSY
25.0000 ug | PREFILLED_SYRINGE | INTRAMUSCULAR | Status: DC | PRN
  Administered 2023-02-17 (×5): 50 ug via INTRAVENOUS

## 2023-02-17 MED ORDER — METHYLPREDNISOLONE ACETATE 40 MG/ML INJ SUSP (RADIOLOG
120.0000 mg | Freq: Once | INTRAMUSCULAR | Status: AC
  Administered 2023-02-17: 120 mg via INTRALESIONAL

## 2023-02-17 MED ORDER — MIDAZOLAM HCL 2 MG/2ML IJ SOLN
1.0000 mg | INTRAMUSCULAR | Status: DC | PRN
  Administered 2023-02-17 (×5): 1 mg via INTRAVENOUS

## 2023-02-17 MED ORDER — SODIUM CHLORIDE 0.9 % IV SOLN
INTRAVENOUS | Status: DC
Start: 2023-02-17 — End: 2023-02-18

## 2023-02-17 MED ORDER — KETOROLAC TROMETHAMINE 30 MG/ML IJ SOLN
30.0000 mg | Freq: Once | INTRAMUSCULAR | Status: AC
Start: 2023-02-17 — End: 2023-02-17
  Administered 2023-02-17: 30 mg via INTRAVENOUS

## 2023-02-17 NOTE — Progress Notes (Signed)
Pt back in nursing recovery area. Pt still drowsy from procedure but will wake up when spoken to. Pt follows commands, talks in complete sentences and has no complaints at this time. Pt will remain in nurses station until discharged by Radiologist.   

## 2023-02-21 ENCOUNTER — Ambulatory Visit: Payer: 59

## 2023-02-21 ENCOUNTER — Ambulatory Visit: Payer: 59 | Admitting: Sports Medicine

## 2023-02-21 DIAGNOSIS — S76301D Unspecified injury of muscle, fascia and tendon of the posterior muscle group at thigh level, right thigh, subsequent encounter: Secondary | ICD-10-CM

## 2023-02-21 DIAGNOSIS — S76301S Unspecified injury of muscle, fascia and tendon of the posterior muscle group at thigh level, right thigh, sequela: Secondary | ICD-10-CM

## 2023-02-21 DIAGNOSIS — S76319A Strain of muscle, fascia and tendon of the posterior muscle group at thigh level, unspecified thigh, initial encounter: Secondary | ICD-10-CM | POA: Insufficient documentation

## 2023-02-21 DIAGNOSIS — M7042 Prepatellar bursitis, left knee: Secondary | ICD-10-CM | POA: Diagnosis not present

## 2023-02-21 NOTE — Progress Notes (Signed)
    Procedures performed today:    None.  Independent interpretation of notes and tests performed by another provider:   None.  Brief History, Exam, Impression, and Recommendations:    Prepatellar bursitis, left knee Pleasant 57 year old female, she had a recent fall directly onto the left knee, she does feel some pain anteriorly. Her exam is completely normal, good motion, good strength, all ligaments are stable, she does have a palpable small organized prepatellar bursa, I think this is likely traumatic prepatellar bursitis. Due to benignity of exam we will hold off on imaging, she can return as needed.  Hamstring injury, right, sequela Recent misstep, she felt a pull in the right buttock, now she has pain at the ischial tuberosity. On exam she does have weakness to resisted knee flexion on the right. Suspect hamstring strain, we will get some x-rays to ensure no ischial tuberosity avulsion fracture, she will also do some physical therapy.    ____________________________________________ [name]. Benjamin Stain, M.D., ABFM., CAQSM., AME. Primary Care and Sports Medicine [facility]  Adjunct Professor of Family Medicine  [employer] [employer]  Restaurant manager, fast food

## 2023-02-21 NOTE — Assessment & Plan Note (Signed)
Pleasant 57 year old female, she had a recent fall directly onto the left knee, she does feel some pain anteriorly. Her exam is completely normal, good motion, good strength, all ligaments are stable, she does have a palpable small organized prepatellar bursa, I think this is likely traumatic prepatellar bursitis. Due to benignity of exam we will hold off on imaging, she can return as needed.

## 2023-02-21 NOTE — Assessment & Plan Note (Signed)
Recent misstep, she felt a pull in the right buttock, now she has pain at the ischial tuberosity. On exam she does have weakness to resisted knee flexion on the right. Suspect hamstring strain, we will get some x-rays to ensure no ischial tuberosity avulsion fracture, she will also do some physical therapy.

## 2023-03-01 ENCOUNTER — Ambulatory Visit: Payer: 59 | Admitting: Family Medicine

## 2023-03-01 ENCOUNTER — Encounter: Payer: Self-pay | Admitting: Family Medicine

## 2023-03-01 VITALS — BP 110/70 | HR 84 | Ht 60.0 in | Wt 159.0 lb

## 2023-03-01 DIAGNOSIS — I10 Essential (primary) hypertension: Secondary | ICD-10-CM | POA: Diagnosis not present

## 2023-03-01 DIAGNOSIS — E119 Type 2 diabetes mellitus without complications: Secondary | ICD-10-CM | POA: Diagnosis not present

## 2023-03-01 LAB — POCT GLYCOSYLATED HEMOGLOBIN (HGB A1C): HbA1c, POC (controlled diabetic range): 5.8 % (ref 0.0–7.0)

## 2023-03-01 MED ORDER — LISINOPRIL 10 MG PO TABS: ORAL_TABLET | ORAL | 3 refills | Status: DC

## 2023-03-01 MED ORDER — TRIAMTERENE-HCTZ 37.5-25 MG PO TABS: 1.0000 | ORAL_TABLET | Freq: Every day | ORAL | 3 refills | Status: DC

## 2023-03-01 MED ORDER — OMEPRAZOLE 40 MG PO CPDR: DELAYED_RELEASE_CAPSULE | ORAL | 3 refills | Status: DC

## 2023-03-05 NOTE — Assessment & Plan Note (Signed)
She has been able to lose a significant amount of weight.  Blood sugars remain well-controlled.  She is not currently on any medications for management of her diabetes.

## 2023-03-05 NOTE — Progress Notes (Signed)
Patricia Arroyo - 57 y.o. female MRN [id number]  Date of birth: May 27, 1965  Subjective Chief Complaint  Patient presents with   Hypertension    HPI Patricia Arroyo is a 57 y.o. female here today for follow-up visit.  She reports she is doing well at this time.  Remains on lisinopril and triamterene/hydrochlorothiazide for management of hypertension.  Blood pressure is well-controlled with current medications.  No side effects at this time.  Denies chest pain, shortness of breath, palpitations, headaches and vision changes.  She has been able to manage her blood sugars with dietary changes.  A1c today is 5.8%.  ROS:  A comprehensive ROS was completed and negative except as noted per HPI  Allergies  Allergen Reactions   Adhesive [Tape] Hives and Other (See Comments)    Pt states that she is allergic to adhesive on Band-Aids.   Banana     Burning sensation on tongue   Diflucan [Fluconazole] Hives   Other Other (See Comments) and Hives    Pt states that she is allergic to adhesive on Band-Aids.   Penicillins Rash   Sulfa Antibiotics Rash    Past Medical History:  Diagnosis Date   ADHD (attention deficit hyperactivity disorder)    Anxiety    Carpal tunnel syndrome    Depression    h/o---no meds now   Diabetes mellitus without complication (HCC)    GERD (gastroesophageal reflux disease)    Hypertension    IBS (irritable bowel syndrome)    Varicose veins     Past Surgical History:  Procedure Laterality Date   ABDOMINOPLASTY  2020   ANTERIOR AND POSTERIOR REPAIR  03/10/2011   Procedure: ANTERIOR (CYSTOCELE) AND POSTERIOR REPAIR (RECTOCELE);  Surgeon: Turner Daniels, MD;  Location: [facility];  Service: Gynecology;  Laterality: N/A;  with Sacrospinous Ligament Suspension   BREAST REDUCTION SURGERY Bilateral 2020   CARPAL TUNNEL RELEASE Bilateral 04/01/2013   Procedure: BILATERAL CARPAL TUNNEL RELEASE;  Surgeon: Tami Ribas, MD;  Location: [facility];  Service:  Orthopedics;  Laterality: Bilateral;   COLONOSCOPY     DILATION AND CURETTAGE OF UTERUS     FOOT FASCIOTOMY  2005   both feet   LAPAROSCOPIC ASSISTED VAGINAL HYSTERECTOMY  03/10/2011   Procedure: LAPAROSCOPIC ASSISTED VAGINAL HYSTERECTOMY;  Surgeon: Turner Daniels, MD;  Location: [facility];  Service: Gynecology;  Laterality: N/A;   NOVASURE ABLATION     SALPINGOOPHORECTOMY  03/10/2011   Procedure: SALPINGO OOPHERECTOMY;  Surgeon: Turner Daniels, MD;  Location: [facility];  Service: Gynecology;  Laterality: Bilateral;   WISDOM TOOTH EXTRACTION      Social History   Socioeconomic History   Marital status: Married    Spouse name: Not on file   Number of children: Not on file   Years of education: Not on file   Highest education level: 12th grade  Occupational History   Not on file  Tobacco Use   Smoking status: Never   Smokeless tobacco: Never  Vaping Use   Vaping status: Never Used  Substance and Sexual Activity   Alcohol use: No    Alcohol/week: 0.0 standard drinks of alcohol    Comment: occasionally   Drug use: No   Sexual activity: Yes    Partners: Male    Birth control/protection: Post-menopausal  Other Topics Concern   Not on file  Social History Narrative   Not on file   Social Determinants of Health   Financial Resource Strain: Low Risk  (  02/21/2023)   Overall Financial Resource Strain (CARDIA)    Difficulty of Paying Living Expenses: Not hard at all  Food Insecurity: No Food Insecurity (02/21/2023)   Hunger Vital Sign    Worried About Running Out of Food in the Last Year: Never true    Ran Out of Food in the Last Year: Never true  Transportation Needs: No Transportation Needs (02/21/2023)   PRAPARE - Administrator, Civil Service (Medical): No    Lack of Transportation (Non-Medical): No  Physical Activity: Unknown (02/21/2023)   Exercise Vital Sign    Days of Exercise per Week: 0 days    Minutes of Exercise per Session: Not on file  Stress: No Stress  Concern Present (02/21/2023)   [employer] of Occupational Health - Occupational Stress Questionnaire    Feeling of Stress : Not at all  Social Connections: Socially Integrated (02/21/2023)   Social Connection and Isolation Panel [NHANES]    Frequency of Communication with Friends and Family: More than three times a week    Frequency of Social Gatherings with Friends and Family: Once a week    Attends Religious Services: More than 4 times per year    Active Member of Clubs or Organizations: Yes    Attends Engineer, structural: More than 4 times per year    Marital Status: Married    Family History  Problem Relation Age of Onset   Cancer Mother    Hypertension Mother    Stroke Mother    Ovarian cancer Mother    Diabetes Father    Heart attack Father    Stroke Father    Diabetes Sister    Ovarian cancer Sister     Health Maintenance  Topic Date Due   Zoster Vaccines- Shingrix (1 of 2) Never done   INFLUENZA VACCINE  11/03/2022   MAMMOGRAM  05/12/2023 (Originally 03/04/2022)   OPHTHALMOLOGY EXAM  08/04/2023 (Originally 08/02/2020)   Diabetic kidney evaluation - eGFR measurement  04/21/2023   Diabetic kidney evaluation - Urine ACR  04/21/2023   HEMOGLOBIN A1C  08/29/2023   Colonoscopy  01/17/2024   FOOT EXAM  02/29/2024   DTaP/Tdap/Td (2 - Td or Tdap) 10/05/2027   COVID-19 Vaccine  Completed   Hepatitis C Screening  Completed   HIV Screening  Completed   HPV VACCINES  Aged Out     ----------------------------------------------------------------------------------------------------------------------------------------------------------------------------------------------------------------- Physical Exam BP 110/70 (BP Location: Left Arm, Patient Position: Sitting, Cuff Size: Normal)   Pulse 84   Ht 5' (1.524 m)   Wt 159 lb (72.1 kg)   SpO2 95%   BMI 31.05 kg/m   Physical Exam Constitutional:      Appearance: Normal appearance.  HENT:     Head:  Normocephalic and atraumatic.  Eyes:     General: No scleral icterus. Cardiovascular:     Rate and Rhythm: Normal rate and regular rhythm.  Pulmonary:     Effort: Pulmonary effort is normal.     Breath sounds: Normal breath sounds.  Musculoskeletal:     Cervical back: Neck supple.  Neurological:     Mental Status: She is alert.  Psychiatric:        Mood and Affect: Mood normal.        Behavior: Behavior normal.     ------------------------------------------------------------------------------------------------------------------------------------------------------------------------------------------------------------------- Assessment and Plan  Diabetes type 2, controlled (HCC) She has been able to lose a significant amount of weight.  Blood sugars remain well-controlled.  She is not currently on any medications  for management of her diabetes.  Essential hypertension Her blood pressure remains well controlled at this time.  She will continue Maxide and lisinopril at current doses.  She is reminded to follow a low-sodium diet. .   Meds ordered this encounter  Medications   lisinopril (ZESTRIL) 10 MG tablet    Sig: TAKE 1 TABLET (10 MG TOTAL) BY MOUTH DAILY.    Dispense:  90 tablet    Refill:  3   omeprazole (PRILOSEC) 40 MG capsule    Sig: TAKE 1 CAPSULE BY MOUTH EVERY DAY    Dispense:  90 capsule    Refill:  3   triamterene-hydrochlorothiazide (MAXZIDE-25) 37.5-25 MG tablet    Sig: Take 1 tablet by mouth daily.    Dispense:  90 tablet    Refill:  3    Return in about 3 months (around 06/01/2023) for Annual Exam/fasting labs.    This visit occurred during the SARS-CoV-2 public health emergency.  Safety protocols were in place, including screening questions prior to the visit, additional usage of staff PPE, and extensive cleaning of exam room while observing appropriate contact time as indicated for disinfecting solutions.

## 2023-03-05 NOTE — Assessment & Plan Note (Signed)
Her blood pressure remains well controlled at this time.  She will continue Maxide and lisinopril at current doses.  She is reminded to follow a low-sodium diet. Marland Kitchen

## 2023-03-06 ENCOUNTER — Encounter: Payer: Self-pay | Admitting: Physical Therapy

## 2023-03-06 ENCOUNTER — Other Ambulatory Visit: Payer: Self-pay

## 2023-03-06 ENCOUNTER — Ambulatory Visit: Payer: 59 | Attending: Sports Medicine | Admitting: Physical Therapy

## 2023-03-06 DIAGNOSIS — R252 Cramp and spasm: Secondary | ICD-10-CM | POA: Insufficient documentation

## 2023-03-06 DIAGNOSIS — M79604 Pain in right leg: Secondary | ICD-10-CM | POA: Diagnosis present

## 2023-03-06 DIAGNOSIS — S76301S Unspecified injury of muscle, fascia and tendon of the posterior muscle group at thigh level, right thigh, sequela: Secondary | ICD-10-CM | POA: Insufficient documentation

## 2023-03-06 DIAGNOSIS — M79605 Pain in left leg: Secondary | ICD-10-CM | POA: Diagnosis present

## 2023-03-06 DIAGNOSIS — M6281 Muscle weakness (generalized): Secondary | ICD-10-CM | POA: Diagnosis present

## 2023-03-06 NOTE — Therapy (Signed)
OUTPATIENT PHYSICAL THERAPY LOWER EXTREMITY EVALUATION   Patient Name: Patricia Arroyo MRN: [id number] DOB:[birth year], 57 y.o., female Today's Date: 03/06/2023  END OF SESSION:  PT End of Session - 03/06/23 1328     Visit Number 1    Date for PT Re-Evaluation 04/17/23    Authorization Type [employer]    PT Start Time 1316    PT Stop Time 1427    PT Time Calculation (min) 71 min    Activity Tolerance Patient tolerated treatment well    Behavior During Therapy WFL for tasks assessed/performed             Past Medical History:  Diagnosis Date   ADHD (attention deficit hyperactivity disorder)    Anxiety    Carpal tunnel syndrome    Depression    h/o---no meds now   Diabetes mellitus without complication (HCC)    GERD (gastroesophageal reflux disease)    Hypertension    IBS (irritable bowel syndrome)    Varicose veins    Past Surgical History:  Procedure Laterality Date   ABDOMINOPLASTY  2020   ANTERIOR AND POSTERIOR REPAIR  03/10/2011   Procedure: ANTERIOR (CYSTOCELE) AND POSTERIOR REPAIR (RECTOCELE);  Surgeon: Turner Daniels, MD;  Location: [facility];  Service: Gynecology;  Laterality: N/A;  with Sacrospinous Ligament Suspension   BREAST REDUCTION SURGERY Bilateral 2020   CARPAL TUNNEL RELEASE Bilateral 04/01/2013   Procedure: BILATERAL CARPAL TUNNEL RELEASE;  Surgeon: Tami Ribas, MD;  Location: [facility];  Service: Orthopedics;  Laterality: Bilateral;   COLONOSCOPY     DILATION AND CURETTAGE OF UTERUS     FOOT FASCIOTOMY  2005   both feet   LAPAROSCOPIC ASSISTED VAGINAL HYSTERECTOMY  03/10/2011   Procedure: LAPAROSCOPIC ASSISTED VAGINAL HYSTERECTOMY;  Surgeon: Turner Daniels, MD;  Location: [facility];  Service: Gynecology;  Laterality: N/A;   NOVASURE ABLATION     SALPINGOOPHORECTOMY  03/10/2011   Procedure: SALPINGO OOPHERECTOMY;  Surgeon: Turner Daniels, MD;  Location: [facility];  Service: Gynecology;  Laterality: Bilateral;   WISDOM TOOTH EXTRACTION     Patient Active  Problem List   Diagnosis Date Noted   Hamstring injury, right, sequela 02/21/2023   Cervical strain 09/02/2022   Bilateral buttock pain 06/23/2022   Lumbosacral spondylosis 11/24/2021   Tick bite 11/14/2021   Anterior tibialis tendinitis, left 10/19/2021   Osteoarthritis of both wrists and hands 09/01/2021   Acute meniscal tear of right knee 03/22/2021   Injury of second toe of right foot 08/24/2020   Aortic atherosclerosis (HCC) 05/15/2020   Ureteric stone 04/23/2020   Gross hematuria 04/23/2020   Lower abdominal pain 03/17/2020   Attention deficit hyperactivity disorder, predominantly inattentive type 11/13/2019   Arthritis 11/13/2019   Anxiety 11/13/2019   Left wrist pain 01/10/2019   Disorder of rotator cuff, left 08/17/2017   Well adult exam 05/20/2016   Ganglion cyst of wrist 09/03/2015   Primary osteoarthritis of left knee 05/27/2015   Prepatellar bursitis, left knee 02/06/2015   Nocturnal leg cramps 02/06/2015   Diabetes type 2, controlled (HCC) 01/13/2015   Fatty liver disease, nonalcoholic 01/13/2015   Essential hypertension 04/20/2014   IBS (irritable bowel syndrome) 04/20/2014   GERD (gastroesophageal reflux disease) 04/20/2014   Varicose veins without complication 02/03/2014    PCP: Everrett Coombe, DO   REFERRING PROVIDER: Monica Becton, MD  REFERRING DIAG: [phone] (ICD-10-CM) - Hamstring injury, right, sequela   THERAPY DIAG:  Pain in right leg  Pain in left  leg  Muscle weakness (generalized)  Cramp and spasm  Rationale for Evaluation and Treatment: Rehabilitation  ONSET DATE: 02/20/2023 hurt hamstring  SUBJECTIVE:   SUBJECTIVE STATEMENT: Had an ablation in low back 2 weeks ago, went into sacrum and messed something up, and it follows it down to soreness into L buttock.  She did also trip on step at home and land on edge of step on buttock. From the fall where she fell on her knee, that feels a little better.   She strained her R  hamstring carrying a large load, it is feeling better as well.  The pain on the L side is really bothering her.  Recent MD note on 02/21/23 "Recent misstep, she felt a pull in the right buttock, now she has pain at the ischial tuberosity.  On exam she does have weakness to resisted knee flexion on the right.  Suspect hamstring strain, we will get some x-rays to ensure no ischial tuberosity avulsion fracture, she will also do some physical therapy."  PERTINENT HISTORY: ADHD, HTN, T2DM (well controlled), NASH, IBS, GERD, OA L knee, R knee meniscal tear (repaired beginning of year), L prepatellar bursitis. PAIN:  Are you having pain? Yes: NPRS scale: 1/10 Pain location: R hamstring  Pain description: tender when press on it Aggravating factors: hard surfaces, walking too fast Relieving factors: walking slow  Are you having pain? Yes: NPRS scale: 8/10 Pain location: L SI/buttock Pain description: sore, tender, shoots across hip Aggravating factors: sitting on hard surfaces, jean pocket irritates it, sleeping on side, wakes at night, standing on concrete Relieving factors: ice to numb, trying to cross leg and get weight off it, pillow   PRECAUTIONS: None  RED FLAGS: None   WEIGHT BEARING RESTRICTIONS: No  FALLS:  Has patient fallen in last 6 months? Yes. Number of falls 1 tripped, works around a lot of trip hazards at work  LIVING ENVIRONMENT: Lives with: lives with their spouse Lives in: House/apartment Stairs: Yes: Internal: 2 steps; none Has following equipment at home: None  OCCUPATION: Psychiatric nurse, on feet on concrete long shifts  PLOF: Independent  PATIENT GOALS: feel better and strengthening my legs.   NEXT MD VISIT: 03/13/23 with referring provider  OBJECTIVE:  Note: Objective measures were completed at Evaluation unless otherwise noted.  DIAGNOSTIC FINDINGS: 02/21/23 DG Hip - results not available.   PATIENT SURVEYS:  LEFS 34/80= 42.5%  COGNITION: Overall  cognitive status: Within functional limits for tasks assessed     SENSATION: WFL  MUSCLE LENGTH: Hamstrings: Right 60 deg; Left 70 deg  Quadriceps (prone knee bend): Right 65 deg; Left 65  deg  POSTURE: No Significant postural limitations  PALPATION: Extreme tenderness bil at ischial tuberosities,   LOWER EXTREMITY MMT:  MMT Right eval Left eval  Hip flexion 5 p! (Adductor longus) 5p! (Adductor longus)  Hip extension    Hip abduction 5 (seated) 5 (seated)  Hip adduction 4+ (Seated) 4+ (Seated)  Knee flexion 5p! (HS) 5p! (HS)  Knee extension 5 5   (Blank rows = not tested)  LOWER EXTREMITY ROM:  WNL in hips  FUNCTIONAL TESTS:   GAIT: Distance walked: 40' Assistive device utilized: None Level of assistance: Complete Independence Comments: no significant deviation   TODAY'S TREATMENT:  DATE:   03/06/23 EVAL Self Care: Education on findings, anatomy, POC, cold v. Heat Ultrasound: x 8 min to L buttock 1 MHz, 1.2 w/cm2 cont to decrease inflammation/pain     PATIENT EDUCATION:  Education details: see self care Person educated: Patient Education method: Medical illustrator Education comprehension: verbalized understanding  HOME EXERCISE PROGRAM: TBD  ASSESSMENT:  CLINICAL IMPRESSION: Patient is a 57 y.o. female who was seen today for physical therapy evaluation and treatment for R hamstring strain.   She also reports pain in L buttock following nerve ablation.  Today she had difficulty sitting, needing extra pillow then frequently shifting position during subjective.  On examination she demonstrates tenderness and tightness in bil hamstrings, tenderness bil ischial tuberosities, and adductor weakness with tenderness at adductor longus attachment.  Discussed she would benefit from gentle strengthening and stretching program, she was  too tender and jumpy today to consider TrDN but perhaps in the future, but applied [place] to L buttock, after which she reported decreased pain.  She was concerned that she recently had facet injections including L L5/S1 injection that could have affected L ischial tuberosity, explained anatomy with model how these structures where not close and the facet injection would not necessarily resolve an ischial bursitis.  Recommended obtaining gel seat to sit on for work to help relieve pressure on this area.  Patricia Arroyo would benefit from skilled physical therapy to address impairments, decrease pain and improve quality of life.    OBJECTIVE IMPAIRMENTS: decreased activity tolerance, decreased mobility, decreased ROM, decreased strength, increased muscle spasms, impaired flexibility, and pain.   ACTIVITY LIMITATIONS: lifting, bending, sitting, standing, squatting, sleeping, stairs, bed mobility, and locomotion level  PARTICIPATION LIMITATIONS: meal prep, cleaning, laundry, driving, shopping, community activity, and occupation  PERSONAL FACTORS: Past/current experiences, Time since onset of injury/illness/exacerbation, and 1-2 comorbidities: ADHD, HTN, T2DM (well controlled), NASH, IBS, GERD, OA L knee, L prepatellar bursitis.  are also affecting patient's functional outcome.   REHAB POTENTIAL: Good  CLINICAL DECISION MAKING: Evolving/moderate complexity  EVALUATION COMPLEXITY: Moderate   GOALS: Goals reviewed with patient? Yes  SHORT TERM GOALS: Target date: 03/20/2023   Patient will be independent with initial HEP. Baseline: needs Goal status: INITIAL  LONG TERM GOALS: Target date: 04/17/2023   Patient will be independent with advanced/ongoing HEP to improve outcomes and carryover.  Baseline:  Goal status: INITIAL  2.  Patient will report at least 75% improvement in L buttock pain to improve QOL. Baseline: 8/10 Goal status: INITIAL  3.  Patient will demonstrate improved functional LE  strength as demonstrated by 5/5 LE strength without pain. Baseline: see objective Goal status: INITIAL  4.  Patient will report 9 points improvement on LEFS to demonstrate improved functional ability. Baseline: 34/80 Goal status: INITIAL  5.  Patient will demonstrate at least 25/30 on FGA to decrease risk of falls. Baseline: NT, 2 recent falls.  Goal status: INITIAL   6.  Patient will be able to sit for 30 min without discomfort.  Baseline: had to lean and reposition constantly today due to pain Goal status: INITIAL   PLAN:  PT FREQUENCY: 1-2x/week  PT DURATION: 6 weeks  PLANNED INTERVENTIONS: 97110-Therapeutic exercises, 97530- Therapeutic activity, 97112- Neuromuscular re-education, 97535- Self Care, 64403- Manual therapy, [phone]- Gait training, [phone]- Orthotic Fit/training, 97014- Electrical stimulation (unattended), [id number]- Electrical stimulation (manual), [id number]- Ultrasound, [id number]- Traction (mechanical), [id number]- Ionotophoresis 4mg /ml Dexamethasone, Patient/Family education, Balance training, Stair training, Taping, Dry Needling, Joint mobilization, Joint manipulation, Spinal manipulation, Spinal mobilization,  Cryotherapy, and Moist heat  PLAN FOR NEXT SESSION: FGA, HEP for gentle HS and quad stretches, continue modalities PRN.     Jena Gauss, PT, DPT 03/06/2023, 3:03 PM

## 2023-03-07 ENCOUNTER — Ambulatory Visit: Payer: 59 | Admitting: Physical Therapy

## 2023-03-07 ENCOUNTER — Encounter: Payer: Self-pay | Admitting: Physical Therapy

## 2023-03-07 DIAGNOSIS — M79604 Pain in right leg: Secondary | ICD-10-CM

## 2023-03-07 DIAGNOSIS — R252 Cramp and spasm: Secondary | ICD-10-CM

## 2023-03-07 DIAGNOSIS — M6281 Muscle weakness (generalized): Secondary | ICD-10-CM

## 2023-03-07 DIAGNOSIS — M79605 Pain in left leg: Secondary | ICD-10-CM

## 2023-03-07 NOTE — Therapy (Signed)
OUTPATIENT PHYSICAL THERAPY TREATMENT   Patient Name: Patricia Arroyo MRN: [id number] DOB:17-Mar-1966, 57 y.o., female Today's Date: 03/07/2023  END OF SESSION:  PT End of Session - 03/07/23 1407     Visit Number 2    Date for PT Re-Evaluation 04/17/23    Authorization Type [employer]    PT Start Time 1405    PT Stop Time 1450    PT Time Calculation (min) 45 min    Activity Tolerance Patient tolerated treatment well    Behavior During Therapy WFL for tasks assessed/performed             Past Medical History:  Diagnosis Date   ADHD (attention deficit hyperactivity disorder)    Anxiety    Carpal tunnel syndrome    Depression    h/o---no meds now   Diabetes mellitus without complication (HCC)    GERD (gastroesophageal reflux disease)    Hypertension    IBS (irritable bowel syndrome)    Varicose veins    Past Surgical History:  Procedure Laterality Date   ABDOMINOPLASTY  2020   ANTERIOR AND POSTERIOR REPAIR  03/10/2011   Procedure: ANTERIOR (CYSTOCELE) AND POSTERIOR REPAIR (RECTOCELE);  Surgeon: Turner Daniels, MD;  Location: [facility];  Service: Gynecology;  Laterality: N/A;  with Sacrospinous Ligament Suspension   BREAST REDUCTION SURGERY Bilateral 2020   CARPAL TUNNEL RELEASE Bilateral 04/01/2013   Procedure: BILATERAL CARPAL TUNNEL RELEASE;  Surgeon: Tami Ribas, MD;  Location: [facility];  Service: Orthopedics;  Laterality: Bilateral;   COLONOSCOPY     DILATION AND CURETTAGE OF UTERUS     FOOT FASCIOTOMY  2005   both feet   LAPAROSCOPIC ASSISTED VAGINAL HYSTERECTOMY  03/10/2011   Procedure: LAPAROSCOPIC ASSISTED VAGINAL HYSTERECTOMY;  Surgeon: Turner Daniels, MD;  Location: [facility];  Service: Gynecology;  Laterality: N/A;   NOVASURE ABLATION     SALPINGOOPHORECTOMY  03/10/2011   Procedure: SALPINGO OOPHERECTOMY;  Surgeon: Turner Daniels, MD;  Location: [facility];  Service: Gynecology;  Laterality: Bilateral;   WISDOM TOOTH EXTRACTION     Patient Active Problem List    Diagnosis Date Noted   Hamstring injury, right, sequela 02/21/2023   Cervical strain 09/02/2022   Bilateral buttock pain 06/23/2022   Lumbosacral spondylosis 11/24/2021   Tick bite 11/14/2021   Anterior tibialis tendinitis, left 10/19/2021   Osteoarthritis of both wrists and hands 09/01/2021   Acute meniscal tear of right knee 03/22/2021   Injury of second toe of right foot 08/24/2020   Aortic atherosclerosis (HCC) 05/15/2020   Ureteric stone 04/23/2020   Gross hematuria 04/23/2020   Lower abdominal pain 03/17/2020   Attention deficit hyperactivity disorder, predominantly inattentive type 11/13/2019   Arthritis 11/13/2019   Anxiety 11/13/2019   Left wrist pain 01/10/2019   Disorder of rotator cuff, left 08/17/2017   Well adult exam 05/20/2016   Ganglion cyst of wrist 09/03/2015   Primary osteoarthritis of left knee 05/27/2015   Prepatellar bursitis, left knee 02/06/2015   Nocturnal leg cramps 02/06/2015   Diabetes type 2, controlled (HCC) 01/13/2015   Fatty liver disease, nonalcoholic 01/13/2015   Essential hypertension 04/20/2014   IBS (irritable bowel syndrome) 04/20/2014   GERD (gastroesophageal reflux disease) 04/20/2014   Varicose veins without complication 02/03/2014    PCP: Everrett Coombe, DO   REFERRING PROVIDER: Monica Becton, MD  REFERRING DIAG: [phone] (ICD-10-CM) - Hamstring injury, right, sequela   THERAPY DIAG:  Pain in right leg  Pain in left leg  Muscle weakness (generalized)  Cramp and spasm  Rationale for Evaluation and Treatment: Rehabilitation  ONSET DATE: 02/20/2023 hurt hamstring  SUBJECTIVE:   SUBJECTIVE STATEMENT: Feeling better, [place] really helped, even though stood a lot last night, did thanksgiving dinner with family.   PERTINENT HISTORY: ADHD, HTN, T2DM (well controlled), NASH, IBS, GERD, OA L knee, R knee meniscal tear (repaired beginning of year), L prepatellar bursitis. PAIN:  Are you having pain? Yes: NPRS scale:  3/10 Pain location: R hamstring  Pain description: tender when press on it Aggravating factors: hard surfaces, walking too fast Relieving factors: walking slow  Are you having pain? Yes: NPRS scale: 6-7/10 Pain location: L SI/buttock Pain description: sore, tender, shoots across hip Aggravating factors: sitting on hard surfaces, jean pocket irritates it, sleeping on side, wakes at night, standing on concrete Relieving factors: ice to numb, trying to cross leg and get weight off it, pillow   PRECAUTIONS: None  RED FLAGS: None   WEIGHT BEARING RESTRICTIONS: No  FALLS:  Has patient fallen in last 6 months? Yes. Number of falls 1 tripped, works around a lot of trip hazards at work  LIVING ENVIRONMENT: Lives with: lives with their spouse Lives in: House/apartment Stairs: Yes: Internal: 2 steps; none Has following equipment at home: None  OCCUPATION: Psychiatric nurse, on feet on concrete long shifts  PLOF: Independent  PATIENT GOALS: feel better and strengthening my legs.   NEXT MD VISIT: 03/13/23 with referring provider  OBJECTIVE:  Note: Objective measures were completed at Evaluation unless otherwise noted.  DIAGNOSTIC FINDINGS: 02/21/23 DG Hip - results not available.   PATIENT SURVEYS:  LEFS 34/80= 42.5%  COGNITION: Overall cognitive status: Within functional limits for tasks assessed     SENSATION: WFL  MUSCLE LENGTH: Hamstrings: Right 60 deg; Left 70 deg  Quadriceps (prone knee bend): Right 65 deg; Left 65  deg  POSTURE: No Significant postural limitations  PALPATION: Extreme tenderness bil at ischial tuberosities,   LOWER EXTREMITY MMT:  MMT Right eval Left eval  Hip flexion 5 p! (Adductor longus) 5p! (Adductor longus)  Hip extension    Hip abduction 5 (seated) 5 (seated)  Hip adduction 4+ (Seated) 4+ (Seated)  Knee flexion 5p! (HS) 5p! (HS)  Knee extension 5 5   (Blank rows = not tested)  LOWER EXTREMITY ROM:  WNL in hips  FUNCTIONAL TESTS:    GAIT: Distance walked: 53' Assistive device utilized: None Level of assistance: Complete Independence Comments: no significant deviation   TODAY'S TREATMENT:                                                                                                                              DATE:   03/07/23 Therapeutic Exercise: to improve strength and mobility.  Demo, verbal and tactile cues throughout for technique. Elephant walks x 20 Seated sciatic nerve glide for dynamic HS stretch x 20 bil  Supine hooklying sciatic nerve glide for dynamic HS stretch x  10 bil  Seated HS stretch x 30 sec bil - improved after dynamic stretching Seated isometric hip adduction with ball 10 x 5 sec hold Ultrasound: x 8 min to L buttock 1 MHz, 1.2 w/cm2 cont to decrease inflammation/pain   03/06/23 EVAL Self Care: Education on findings, anatomy, POC, cold v. Heat Ultrasound: x 8 min to L buttock 1 MHz, 1.2 w/cm2 cont to decrease inflammation/pain     PATIENT EDUCATION:  Education details: see self care Person educated: Patient Education method: Explanation and Demonstration Education comprehension: verbalized understanding  HOME EXERCISE PROGRAM: Access Code: [email or web address] URL: [email or web address] Date: 03/07/2023 Prepared by: Harrie Foreman  Exercises - Seated Isometric Hip Adduction with Newman Pies  - 1 x daily - 7 x weekly - 2 sets - 10 reps    ASSESSMENT:  CLINICAL IMPRESSION: Patricia Arroyo reported decreased L gluteal pain after [place] yesterday.  Today focused on gentle HS stretches for HEP followed by [place] to L gluteal region again to decrease pain.  Patricia Arroyo continues to demonstrate potential for improvement and would benefit from continued skilled therapy to address impairments.     OBJECTIVE IMPAIRMENTS: decreased activity tolerance, decreased mobility, decreased ROM, decreased strength, increased muscle spasms, impaired flexibility, and pain.   ACTIVITY LIMITATIONS: lifting,  bending, sitting, standing, squatting, sleeping, stairs, bed mobility, and locomotion level  PARTICIPATION LIMITATIONS: meal prep, cleaning, laundry, driving, shopping, community activity, and occupation  PERSONAL FACTORS: Past/current experiences, Time since onset of injury/illness/exacerbation, and 1-2 comorbidities: ADHD, HTN, T2DM (well controlled), NASH, IBS, GERD, OA L knee, L prepatellar bursitis.  are also affecting patient's functional outcome.   REHAB POTENTIAL: Good  CLINICAL DECISION MAKING: Evolving/moderate complexity  EVALUATION COMPLEXITY: Moderate   GOALS: Goals reviewed with patient? Yes  SHORT TERM GOALS: Target date: 03/20/2023   Patient will be independent with initial HEP. Baseline: needs Goal status: IN PROGRESS  LONG TERM GOALS: Target date: 04/17/2023   Patient will be independent with advanced/ongoing HEP to improve outcomes and carryover.  Baseline:  Goal status: IN PROGRESS  2.  Patient will report at least 75% improvement in L buttock pain to improve QOL. Baseline: 8/10 Goal status: IN PROGRESS  3.  Patient will demonstrate improved functional LE strength as demonstrated by 5/5 LE strength without pain. Baseline: see objective Goal status: IN PROGRESS  4.  Patient will report 9 points improvement on LEFS to demonstrate improved functional ability. Baseline: 34/80 Goal status: IN PROGRESS  5.  Patient will demonstrate at least 25/30 on FGA to decrease risk of falls. Baseline: NT, 2 recent falls.  Goal status: IN PROGRESS   6.  Patient will be able to sit for 30 min without discomfort.  Baseline: had to lean and reposition constantly today due to pain Goal status: IN PROGRESS   PLAN:  PT FREQUENCY: 1-2x/week  PT DURATION: 6 weeks  PLANNED INTERVENTIONS: 97110-Therapeutic exercises, 97530- Therapeutic activity, 97112- Neuromuscular re-education, 97535- Self Care, 27253- Manual therapy, [id number]- Gait training, [phone]- Orthotic Fit/training,  97014- Electrical stimulation (unattended), [id number]- Electrical stimulation (manual), [id number]- Ultrasound, [id number]- Traction (mechanical), [id number]- Ionotophoresis 4mg /ml Dexamethasone, Patient/Family education, Balance training, Stair training, Taping, Dry Needling, Joint mobilization, Joint manipulation, Spinal manipulation, Spinal mobilization, Cryotherapy, and Moist heat  PLAN FOR NEXT SESSION: FGA, HEP for gentle HS and quad stretches, continue modalities PRN.     Jena Gauss, PT, DPT 03/07/2023, 2:58 PM

## 2023-03-09 ENCOUNTER — Encounter: Payer: Self-pay | Admitting: Sports Medicine

## 2023-03-09 ENCOUNTER — Ambulatory Visit (INDEPENDENT_AMBULATORY_CARE_PROVIDER_SITE_OTHER): Payer: 59 | Admitting: Sports Medicine

## 2023-03-09 DIAGNOSIS — M25552 Pain in left hip: Secondary | ICD-10-CM

## 2023-03-09 DIAGNOSIS — S76311A Strain of muscle, fascia and tendon of the posterior muscle group at thigh level, right thigh, initial encounter: Secondary | ICD-10-CM | POA: Insufficient documentation

## 2023-03-09 NOTE — Assessment & Plan Note (Addendum)
57 year old female, chronic bilateral hip pain, currently left worse than right, right hamstring pain has improved considerably. She localizes the left hip pain somewhat diffusely but when pushed to localize the structure she points more at the ischial tuberosity. She does get some discomfort lateral to the sacroiliac joints. There is significant allodynia and hyperalgesia. Patient is tearful in the exam room. We will have her continue physical therapy however considering duration of hip pain and unrevealing x-rays I would like to proceed with MRI of the left hip without contrast looking for signs of an ischial bursitis versus sacral/pelvic stress injury. I would like to see her back after the MRI. She understands that we may not find a structural defect and if this is the case we would take more of a myofascial/fibromyalgia approach. I explained the importance of neuropathic agents including medications and the antidepressant class and how they were also effective for pain. If the right does come back negative or unrevealing I typically prefer to start with an SNRI rather than gabapentin or Lyrica due to less sedation.

## 2023-03-09 NOTE — Progress Notes (Signed)
    Procedures performed today:    None.  Independent interpretation of notes and tests performed by another provider:   None.  Brief History, Exam, Impression, and Recommendations:    Posterior pain of left hip 57 year old female, chronic bilateral hip pain, currently left worse than right, right hamstring pain has improved considerably. She localizes the left hip pain somewhat diffusely but when pushed to localize the structure she points more at the ischial tuberosity. She does get some discomfort lateral to the sacroiliac joints. There is significant allodynia and hyperalgesia. Patient is tearful in the exam room. We will have her continue physical therapy however considering duration of hip pain and unrevealing x-rays I would like to proceed with MRI of the left hip without contrast looking for signs of an ischial bursitis versus sacral/pelvic stress injury. I would like to see her back after the MRI. She understands that we may not find a structural defect and if this is the case we would take more of a myofascial/fibromyalgia approach. I explained the importance of neuropathic agents including medications and the antidepressant class and how they were also effective for pain. If the right does come back negative or unrevealing I typically prefer to start with an SNRI rather than gabapentin or Lyrica due to less sedation.  I spent 40 minutes of total time managing this patient today, this includes chart review, face to face, and non-face to face time.  The patient had multiple questions regarding possible causes of her left hip pain as well as prognostic questions.  ____________________________________________ [name]. Benjamin Stain, M.D., ABFM., CAQSM., AME. Primary Care and Sports Medicine  [facility]  Adjunct Professor of Family Medicine  [place] [employer]  Restaurant manager, fast food

## 2023-03-13 ENCOUNTER — Other Ambulatory Visit: Payer: 59

## 2023-03-13 ENCOUNTER — Ambulatory Visit (HOSPITAL_COMMUNITY)
Admission: RE | Admit: 2023-03-13 | Discharge: 2023-03-13 | Disposition: A | Discharge status: Home / Self Care | Payer: 59 | Source: Ambulatory Visit | Attending: Sports Medicine | Admitting: Sports Medicine

## 2023-03-13 ENCOUNTER — Ambulatory Visit: Payer: 59 | Admitting: Sports Medicine

## 2023-03-13 DIAGNOSIS — M25552 Pain in left hip: Secondary | ICD-10-CM | POA: Insufficient documentation

## 2023-03-14 ENCOUNTER — Telehealth: Payer: Self-pay

## 2023-03-14 NOTE — Telephone Encounter (Unsigned)
Copied from CRM [id number]. Topic: Appointments - Scheduling Inquiry for Clinic >> Mar 13, 2023  3:01 PM Fuller Mandril wrote: Reason for CRM: Pt called wanted to schedule appt with Dr. Karie Schwalbe for an injection for her shoulder. States she was not having problems at last visit but now hurts to turn her neck. No appt was available within two days.

## 2023-03-15 ENCOUNTER — Encounter: Payer: Self-pay | Admitting: Physical Therapy

## 2023-03-15 ENCOUNTER — Telehealth: Payer: Self-pay

## 2023-03-15 ENCOUNTER — Encounter: Payer: Self-pay | Admitting: Sports Medicine

## 2023-03-15 ENCOUNTER — Ambulatory Visit (INDEPENDENT_AMBULATORY_CARE_PROVIDER_SITE_OTHER): Payer: 59 | Admitting: Sports Medicine

## 2023-03-15 ENCOUNTER — Ambulatory Visit: Payer: 59 | Admitting: Physical Therapy

## 2023-03-15 DIAGNOSIS — Z0000 Encounter for general adult medical examination without abnormal findings: Secondary | ICD-10-CM

## 2023-03-15 DIAGNOSIS — M79604 Pain in right leg: Secondary | ICD-10-CM

## 2023-03-15 DIAGNOSIS — M6281 Muscle weakness (generalized): Secondary | ICD-10-CM

## 2023-03-15 DIAGNOSIS — I10 Essential (primary) hypertension: Secondary | ICD-10-CM

## 2023-03-15 DIAGNOSIS — M79605 Pain in left leg: Secondary | ICD-10-CM

## 2023-03-15 DIAGNOSIS — S161XXA Strain of muscle, fascia and tendon at neck level, initial encounter: Secondary | ICD-10-CM

## 2023-03-15 DIAGNOSIS — M47812 Spondylosis without myelopathy or radiculopathy, cervical region: Secondary | ICD-10-CM | POA: Diagnosis not present

## 2023-03-15 DIAGNOSIS — R252 Cramp and spasm: Secondary | ICD-10-CM

## 2023-03-15 DIAGNOSIS — E119 Type 2 diabetes mellitus without complications: Secondary | ICD-10-CM

## 2023-03-15 NOTE — Progress Notes (Signed)
    Procedures performed today:    Procedure:  Injection of right paracervical trigger point x 3 Consent obtained and verified. Time-out conducted. Noted no overlying erythema, induration, or other signs of local infection. Skin prepped in a sterile fashion. Topical analgesic spray: Ethyl chloride. Completed without difficulty. Meds: A total of 1 cc lidocaine, 1 cc kenalog 40 spread out between the 3 trigger points Advised to call if fevers/chills, erythema, induration, drainage, or persistent bleeding.  Independent interpretation of notes and tests performed by another provider:   None.  Brief History, Exam, Impression, and Recommendations:    Cervical spondylosis 57 year old female, known cervical spondylosis based on an MRI from 2020, intermittent bilateral paracervical spasm. She is requesting trigger point injections today along the right side, we did this for her along with 3 paraspinal trigger points predominantly at the trapezius. We will also add some physical therapy. Return in 6 weeks for this, imaging if not better.    ____________________________________________ [name]. Benjamin Stain, M.D., ABFM., CAQSM., AME. Primary Care and Sports Medicine [facility]  Adjunct Professor of Family Medicine  [employer]

## 2023-03-15 NOTE — Telephone Encounter (Signed)
Copied from CRM [id number]. Topic: General - Other >> Mar 15, 2023  8:28 AM Prudencio Pair wrote: Reason for CRM: Patient would like for Dr. Melvia Heaps nurse to give her a call. She is upset that previous agent scheduled her with Dr. Ashley Royalty for a trigger point injection instead of Dr. Karie Schwalbe. Attempted other appts with Dr. Karie Schwalbe & pt stated none of those would work for her. She stated she can't hardly turn her neck. Please give patient a call back. CB #: [phone].

## 2023-03-15 NOTE — Assessment & Plan Note (Signed)
57 year old female, known cervical spondylosis based on an MRI from 2020, intermittent bilateral paracervical spasm. She is requesting trigger point injections today along the right side, we did this for her along with 3 paraspinal trigger points predominantly at the trapezius. We will also add some physical therapy. Return in 6 weeks for this, imaging if not better.

## 2023-03-15 NOTE — Patient Instructions (Signed)

## 2023-03-15 NOTE — Telephone Encounter (Signed)
Patient scheduled for Physical on 04/26/23, patient would like to get fasting labs done a week prior, please advise, thanks.

## 2023-03-15 NOTE — Therapy (Signed)
OUTPATIENT PHYSICAL THERAPY TREATMENT   Patient Name: NIKCOLE GRAF MRN: [id number] DOB:[birth year], 57 y.o., female Today's Date: 03/15/2023  END OF SESSION:  PT End of Session - 03/15/23 0856     Visit Number 3    Date for PT Re-Evaluation 04/17/23    Authorization Type [employer]    PT Start Time 0850    PT Stop Time 0935    PT Time Calculation (min) 45 min    Activity Tolerance Patient tolerated treatment well    Behavior During Therapy WFL for tasks assessed/performed             Past Medical History:  Diagnosis Date   ADHD (attention deficit hyperactivity disorder)    Anxiety    Carpal tunnel syndrome    Depression    h/o---no meds now   Diabetes mellitus without complication (HCC)    GERD (gastroesophageal reflux disease)    Hypertension    IBS (irritable bowel syndrome)    Varicose veins    Past Surgical History:  Procedure Laterality Date   ABDOMINOPLASTY  2020   ANTERIOR AND POSTERIOR REPAIR  03/10/2011   Procedure: ANTERIOR (CYSTOCELE) AND POSTERIOR REPAIR (RECTOCELE);  Surgeon: Turner Daniels, MD;  Location: [facility];  Service: Gynecology;  Laterality: N/A;  with Sacrospinous Ligament Suspension   BREAST REDUCTION SURGERY Bilateral 2020   CARPAL TUNNEL RELEASE Bilateral 04/01/2013   Procedure: BILATERAL CARPAL TUNNEL RELEASE;  Surgeon: Tami Ribas, MD;  Location: [facility];  Service: Orthopedics;  Laterality: Bilateral;   COLONOSCOPY     DILATION AND CURETTAGE OF UTERUS     FOOT FASCIOTOMY  2005   both feet   LAPAROSCOPIC ASSISTED VAGINAL HYSTERECTOMY  03/10/2011   Procedure: LAPAROSCOPIC ASSISTED VAGINAL HYSTERECTOMY;  Surgeon: Turner Daniels, MD;  Location: [facility];  Service: Gynecology;  Laterality: N/A;   NOVASURE ABLATION     SALPINGOOPHORECTOMY  03/10/2011   Procedure: SALPINGO OOPHERECTOMY;  Surgeon: Turner Daniels, MD;  Location: [facility];  Service: Gynecology;  Laterality: Bilateral;   WISDOM TOOTH EXTRACTION     Patient Active Problem List    Diagnosis Date Noted   Posterior pain of left hip 03/09/2023   Hamstring injury, right, sequela 02/21/2023   Cervical strain 09/02/2022   Bilateral buttock pain 06/23/2022   Lumbosacral spondylosis 11/24/2021   Tick bite 11/14/2021   Anterior tibialis tendinitis, left 10/19/2021   Osteoarthritis of both wrists and hands 09/01/2021   Acute meniscal tear of right knee 03/22/2021   Injury of second toe of right foot 08/24/2020   Aortic atherosclerosis (HCC) 05/15/2020   Ureteric stone 04/23/2020   Gross hematuria 04/23/2020   Lower abdominal pain 03/17/2020   Attention deficit hyperactivity disorder, predominantly inattentive type 11/13/2019   Arthritis 11/13/2019   Anxiety 11/13/2019   Left wrist pain 01/10/2019   Disorder of rotator cuff, left 08/17/2017   Well adult exam 05/20/2016   Ganglion cyst of wrist 09/03/2015   Primary osteoarthritis of left knee 05/27/2015   Prepatellar bursitis, left knee 02/06/2015   Nocturnal leg cramps 02/06/2015   Diabetes type 2, controlled (HCC) 01/13/2015   Fatty liver disease, nonalcoholic 01/13/2015   Essential hypertension 04/20/2014   IBS (irritable bowel syndrome) 04/20/2014   GERD (gastroesophageal reflux disease) 04/20/2014   Varicose veins without complication 02/03/2014    PCP: Everrett Coombe, DO   REFERRING PROVIDER: Monica Becton, MD  REFERRING DIAG: S76.301S (ICD-10-CM) - Hamstring injury, right, sequela   THERAPY DIAG:  Pain in  right leg  Pain in left leg  Muscle weakness (generalized)  Cramp and spasm  Rationale for Evaluation and Treatment: Rehabilitation  ONSET DATE: 02/20/2023 hurt hamstring  SUBJECTIVE:   SUBJECTIVE STATEMENT: Had a MRI, the glass felt warm and afterwards her hips and neck didn't for 2 days.  However she had a severe spasm in her L hip the other night, woke up screaming, improved after 40 min of heat.  Saw Dr. Karie Schwalbe about it, not sure why, which is why the MRI was ordered, but hips are  feeling better, neck still hurts, she is supposed to get a trigger point injection.  She has not had a chance to do HS stretches yet due to work.    PERTINENT HISTORY: ADHD, HTN, T2DM (well controlled), NASH, IBS, GERD, OA L knee, R knee meniscal tear (repaired beginning of year), L prepatellar bursitis. PAIN:  Are you having pain? Yes: NPRS scale: 3/10 Pain location: R hamstring  Pain description: tender when press on it Aggravating factors: hard surfaces, walking too fast Relieving factors: walking slow  Are you having pain? Yes: NPRS scale: 3/10 Pain location: L SI/buttock Pain description: sore, tender, shoots across hip Aggravating factors: sitting on hard surfaces, jean pocket irritates it, sleeping on side, wakes at night, standing on concrete Relieving factors: ice to numb, trying to cross leg and get weight off it, pillow   PRECAUTIONS: None  RED FLAGS: None   WEIGHT BEARING RESTRICTIONS: No  FALLS:  Has patient fallen in last 6 months? Yes. Number of falls 1 tripped, works around a lot of trip hazards at work  LIVING ENVIRONMENT: Lives with: lives with their spouse Lives in: House/apartment Stairs: Yes: Internal: 2 steps; none Has following equipment at home: None  OCCUPATION: Psychiatric nurse, on feet on concrete long shifts  PLOF: Independent  PATIENT GOALS: feel better and strengthening my legs.   NEXT MD VISIT: 03/13/23 with referring provider  OBJECTIVE:  Note: Objective measures were completed at Evaluation unless otherwise noted.  DIAGNOSTIC FINDINGS: 02/21/23 DG Hip - results not available.   PATIENT SURVEYS:  LEFS 34/80= 42.5%  COGNITION: Overall cognitive status: Within functional limits for tasks assessed     SENSATION: WFL  MUSCLE LENGTH: Hamstrings: Right 60 deg; Left 70 deg  Quadriceps (prone knee bend): Right 65 deg; Left 65  deg  POSTURE: No Significant postural limitations  PALPATION: Extreme tenderness bil at ischial  tuberosities,   LOWER EXTREMITY MMT:  MMT Right eval Left eval  Hip flexion 5 p! (Adductor longus) 5p! (Adductor longus)  Hip extension    Hip abduction 5 (seated) 5 (seated)  Hip adduction 4+ (Seated) 4+ (Seated)  Knee flexion 5p! (HS) 5p! (HS)  Knee extension 5 5   (Blank rows = not tested)  LOWER EXTREMITY ROM:  WNL in hips  FUNCTIONAL TESTS:   GAIT: Distance walked: 17' Assistive device utilized: None Level of assistance: Complete Independence Comments: no significant deviation   TODAY'S TREATMENT:  DATE:   03/15/23 Therapeutic Exercise: to improve strength and mobility.  Demo, verbal and tactile cues throughout for technique. Bike L1 x 6 min Elephant walks x 10 Review of HEP Chin tucks x 10 Seated piriformis stretches HEP update Self Care: Education on posture, taking movement breaks throughout the day, to avoid stiffness, do not sit on objects in back pocket. Manual Therapy: to decrease muscle spasm and pain and improve mobility STM/TPR to glutes,  IASTM to L hamstring, skilled palpation and monitoring during dry needling. Trigger Point Dry-Needling  Treatment instructions: Expect mild to moderate muscle soreness. S/S of pneumothorax if dry needled over a lung field, and to seek immediate medical attention should they occur. Patient verbalized understanding of these instructions and education. Patient Consent Given: Yes Education handout provided: Yes Muscles treated: L priformis, L glute med Electrical stimulation performed: No Parameters: N/A Treatment response/outcome: Twitch Response Elicited and Palpable Increase in Muscle Length    03/07/23 Therapeutic Exercise: to improve strength and mobility.  Demo, verbal and tactile cues throughout for technique. Elephant walks x 20 Seated sciatic nerve glide for dynamic HS stretch x 20 bil   Supine hooklying sciatic nerve glide for dynamic HS stretch x 10 bil  Seated HS stretch x 30 sec bil - improved after dynamic stretching Seated isometric hip adduction with ball 10 x 5 sec hold Ultrasound: x 8 min to L buttock 1 MHz, 1.2 w/cm2 cont to decrease inflammation/pain   03/06/23 EVAL Self Care: Education on findings, anatomy, POC, cold v. Heat Ultrasound: x 8 min to L buttock 1 MHz, 1.2 w/cm2 cont to decrease inflammation/pain     PATIENT EDUCATION:  Education details: HEP update, TrDN Person educated: Patient Education method: Explanation, Demonstration, Verbal cues, and Handouts Education comprehension: verbalized understanding and returned demonstration  HOME EXERCISE PROGRAM: Access Code: [email or web address] URL: [email or web address] Date: 03/07/2023 Prepared by: Harrie Foreman  Exercises - Seated Isometric Hip Adduction with Newman Pies  - 1 x daily - 7 x weekly - 2 sets - 10 reps    ASSESSMENT:  CLINICAL IMPRESSION: DEVONNE SANTELLANO arrived today reporting incident of increased L gluteal pain, but decreased following MRI, minimal R hamstring pain but still having some weakness with walking, and poor compliance with HEP.  Discussed ways to incorporated HEP more as movement breaks rather than exercises to make them less overwhelming.  She had significant trigger points in L piriformis and L glute medius.  After explanation of DN rational, procedures, outcomes and potential side effects, patient verbalized consent to DN treatment in conjunction with manual STM/DTM and TPR to reduce ttp/muscle tension. Muscles treated as indicated above. DN produced normal response with good twitches elicited resulting in palpable reduction in pain/ttp and muscle tension, with patient noting less pain upon initiation of movement following DN. Pt educated to expect mild to moderate muscle soreness for up to 24-48 hrs and instructed to continue prescribed home exercise program and current  activity level with pt verbalizing understanding of theses instructions.     VERDIS GRANAT continues to demonstrate potential for improvement and would benefit from continued skilled therapy to address impairments.     OBJECTIVE IMPAIRMENTS: decreased activity tolerance, decreased mobility, decreased ROM, decreased strength, increased muscle spasms, impaired flexibility, and pain.   ACTIVITY LIMITATIONS: lifting, bending, sitting, standing, squatting, sleeping, stairs, bed mobility, and locomotion level  PARTICIPATION LIMITATIONS: meal prep, cleaning, laundry, driving, shopping, community activity, and occupation  PERSONAL FACTORS: Past/current experiences, Time since onset of injury/illness/exacerbation, and 1-2 comorbidities: ADHD,  HTN, T2DM (well controlled), NASH, IBS, GERD, OA L knee, L prepatellar bursitis.  are also affecting patient's functional outcome.   REHAB POTENTIAL: Good  CLINICAL DECISION MAKING: Evolving/moderate complexity  EVALUATION COMPLEXITY: Moderate   GOALS: Goals reviewed with patient? Yes  SHORT TERM GOALS: Target date: 03/20/2023   Patient will be independent with initial HEP. Baseline: needs Goal status: IN PROGRESS 03/15/23 - reviewed  LONG TERM GOALS: Target date: 04/17/2023   Patient will be independent with advanced/ongoing HEP to improve outcomes and carryover.  Baseline:  Goal status: IN PROGRESS  2.  Patient will report at least 75% improvement in L buttock pain to improve QOL. Baseline: 8/10 Goal status: IN PROGRESS  3.  Patient will demonstrate improved functional LE strength as demonstrated by 5/5 LE strength without pain. Baseline: see objective Goal status: IN PROGRESS  4.  Patient will report 9 points improvement on LEFS to demonstrate improved functional ability. Baseline: 34/80 Goal status: IN PROGRESS  5.  Patient will demonstrate at least 25/30 on FGA to decrease risk of falls. Baseline: NT, 2 recent falls.  Goal status: IN  PROGRESS   6.  Patient will be able to sit for 30 min without discomfort.  Baseline: had to lean and reposition constantly today due to pain Goal status: IN PROGRESS   PLAN:  PT FREQUENCY: 1-2x/week  PT DURATION: 6 weeks  PLANNED INTERVENTIONS: 97110-Therapeutic exercises, 97530- Therapeutic activity, 97112- Neuromuscular re-education, 97535- Self Care, [id number]- Manual therapy, [id number]- Gait training, [phone]- Orthotic Fit/training, [id number]- Electrical stimulation (unattended), [id number]- Electrical stimulation (manual), [id number]- Ultrasound, [id number]- Traction (mechanical), [id number]- Ionotophoresis 4mg /ml Dexamethasone, Patient/Family education, Balance training, Stair training, Taping, Dry Needling, Joint mobilization, Joint manipulation, Spinal manipulation, Spinal mobilization, Cryotherapy, and Moist heat  PLAN FOR NEXT SESSION: FGA, HEP for gentle HS and quad stretches, continue modalities PRN.     Jena Gauss, PT, DPT 03/15/2023, 11:40 AM

## 2023-03-20 ENCOUNTER — Ambulatory Visit: Payer: 59 | Admitting: Family Medicine

## 2023-03-20 ENCOUNTER — Ambulatory Visit (HOSPITAL_COMMUNITY): Payer: 59

## 2023-03-20 ENCOUNTER — Encounter: Payer: Self-pay | Admitting: Physical Therapy

## 2023-03-20 ENCOUNTER — Other Ambulatory Visit: Payer: 59

## 2023-03-20 ENCOUNTER — Ambulatory Visit: Payer: 59 | Admitting: Physical Therapy

## 2023-03-20 DIAGNOSIS — M79604 Pain in right leg: Secondary | ICD-10-CM

## 2023-03-20 DIAGNOSIS — R252 Cramp and spasm: Secondary | ICD-10-CM

## 2023-03-20 DIAGNOSIS — M79605 Pain in left leg: Secondary | ICD-10-CM

## 2023-03-20 DIAGNOSIS — M6281 Muscle weakness (generalized): Secondary | ICD-10-CM

## 2023-03-20 NOTE — Therapy (Signed)
OUTPATIENT PHYSICAL THERAPY TREATMENT/Reevaluation   Patient Name: Patricia Arroyo MRN: [id number] DOB:[birth year], 57 y.o., female Today's Date: 03/20/2023  END OF SESSION:  PT End of Session - 03/20/23 0939     Visit Number 4    Date for PT Re-Evaluation 04/17/23    Authorization Type [employer]    PT Start Time [phone]    PT Stop Time 1015    PT Time Calculation (min) 37 min    Activity Tolerance Patient tolerated treatment well    Behavior During Therapy WFL for tasks assessed/performed             Past Medical History:  Diagnosis Date   ADHD (attention deficit hyperactivity disorder)    Anxiety    Carpal tunnel syndrome    Depression    h/o---no meds now   Diabetes mellitus without complication (HCC)    GERD (gastroesophageal reflux disease)    Hypertension    IBS (irritable bowel syndrome)    Varicose veins    Past Surgical History:  Procedure Laterality Date   ABDOMINOPLASTY  2020   ANTERIOR AND POSTERIOR REPAIR  03/10/2011   Procedure: ANTERIOR (CYSTOCELE) AND POSTERIOR REPAIR (RECTOCELE);  Surgeon: Turner Daniels, MD;  Location: [facility];  Service: Gynecology;  Laterality: N/A;  with Sacrospinous Ligament Suspension   BREAST REDUCTION SURGERY Bilateral 2020   CARPAL TUNNEL RELEASE Bilateral 04/01/2013   Procedure: BILATERAL CARPAL TUNNEL RELEASE;  Surgeon: Tami Ribas, MD;  Location: [facility];  Service: Orthopedics;  Laterality: Bilateral;   COLONOSCOPY     DILATION AND CURETTAGE OF UTERUS     FOOT FASCIOTOMY  2005   both feet   LAPAROSCOPIC ASSISTED VAGINAL HYSTERECTOMY  03/10/2011   Procedure: LAPAROSCOPIC ASSISTED VAGINAL HYSTERECTOMY;  Surgeon: Turner Daniels, MD;  Location: [facility];  Service: Gynecology;  Laterality: N/A;   NOVASURE ABLATION     SALPINGOOPHORECTOMY  03/10/2011   Procedure: SALPINGO OOPHERECTOMY;  Surgeon: Turner Daniels, MD;  Location: [facility];  Service: Gynecology;  Laterality: Bilateral;   WISDOM TOOTH EXTRACTION     Patient Active  Problem List   Diagnosis Date Noted   Posterior pain of left hip 03/09/2023   Hamstring injury, right, sequela 02/21/2023   Cervical spondylosis 09/02/2022   Bilateral buttock pain 06/23/2022   Lumbosacral spondylosis 11/24/2021   Tick bite 11/14/2021   Anterior tibialis tendinitis, left 10/19/2021   Osteoarthritis of both wrists and hands 09/01/2021   Acute meniscal tear of right knee 03/22/2021   Injury of second toe of right foot 08/24/2020   Aortic atherosclerosis (HCC) 05/15/2020   Ureteric stone 04/23/2020   Gross hematuria 04/23/2020   Lower abdominal pain 03/17/2020   Attention deficit hyperactivity disorder, predominantly inattentive type 11/13/2019   Arthritis 11/13/2019   Anxiety 11/13/2019   Left wrist pain 01/10/2019   Disorder of rotator cuff, left 08/17/2017   Well adult exam 05/20/2016   Ganglion cyst of wrist 09/03/2015   Primary osteoarthritis of left knee 05/27/2015   Prepatellar bursitis, left knee 02/06/2015   Nocturnal leg cramps 02/06/2015   Diabetes type 2, controlled (HCC) 01/13/2015   Fatty liver disease, nonalcoholic 01/13/2015   Essential hypertension 04/20/2014   IBS (irritable bowel syndrome) 04/20/2014   GERD (gastroesophageal reflux disease) 04/20/2014   Varicose veins without complication 02/03/2014    PCP: Everrett Coombe, DO   REFERRING PROVIDER: Monica Becton, MD  REFERRING DIAG: S76.301S (ICD-10-CM) - Hamstring injury, right, sequela  S16.1XXA (ICD-10-CM) - Strain of neck  muscle, initial encounter (added 03/15/23)  THERAPY DIAG:  Pain in right leg  Pain in left leg  Muscle weakness (generalized)  Cramp and spasm  Rationale for Evaluation and Treatment: Rehabilitation  ONSET DATE: 02/20/2023 hurt hamstring  SUBJECTIVE:   SUBJECTIVE STATEMENT: MRI of hip was fine, only twinges in hips since TrDN, had shots in right side of neck which helped but L side is really bothering her, has orders now for neck for PT to be  evaluated.      PERTINENT HISTORY: ADHD, HTN, T2DM (well controlled), NASH, IBS, GERD, OA L knee, R knee meniscal tear (repaired beginning of year), L prepatellar bursitis. PAIN:  Are you having pain? Yes: NPRS scale: 3/10 Pain location: R hamstring attachment  Pain description: tender when press on it Aggravating factors: hard surfaces, walking too fast Relieving factors: walking slow  Are you having pain? Yes: NPRS scale: 3/10 Pain location: L hamstring attachment Pain description: sore, tender, shoots across hip Aggravating factors: sitting on hard surfaces, jean pocket irritates it, sleeping on side, wakes at night, standing on concrete Relieving factors: ice to numb, trying to cross leg and get weight off it, pillow   Are you having pain? Yes: NPRS scale: 5-10/10 Pain location: L side of neck   Pain description: spasmed Aggravating factors: turning Relieving factors: heat  PRECAUTIONS: None  RED FLAGS: None   WEIGHT BEARING RESTRICTIONS: No  FALLS:  Has patient fallen in last 6 months? Yes. Number of falls 1 tripped, works around a lot of trip hazards at work  LIVING ENVIRONMENT: Lives with: lives with their spouse Lives in: House/apartment Stairs: Yes: Internal: 2 steps; none Has following equipment at home: None  OCCUPATION: Psychiatric nurse, on feet on concrete long shifts  PLOF: Independent  PATIENT GOALS: feel better and strengthening my legs.   NEXT MD VISIT: 03/13/23 with referring provider  OBJECTIVE:  Note: Objective measures were completed at Evaluation unless otherwise noted.  DIAGNOSTIC FINDINGS: 02/21/23 DG Hip - results not available.   PATIENT SURVEYS:  LEFS 34/80= 42.5%  COGNITION: Overall cognitive status: Within functional limits for tasks assessed     SENSATION: WFL  MUSCLE LENGTH: Hamstrings: Right 60 deg; Left 70 deg  Quadriceps (prone knee bend): Right 65 deg; Left 65  deg  POSTURE: No Significant postural  limitations  PALPATION: Extreme tenderness bil at ischial tuberosities,   LOWER EXTREMITY MMT:  MMT Right eval Left eval  Hip flexion 5 p! (Adductor longus) 5p! (Adductor longus)  Hip extension    Hip abduction 5 (seated) 5 (seated)  Hip adduction 4+ (Seated) 4+ (Seated)  Knee flexion 5p! (HS) 5p! (HS)  Knee extension 5 5   (Blank rows = not tested)  LOWER EXTREMITY ROM:  WNL in hips  FUNCTIONAL TESTS:   GAIT: Distance walked: 74' Assistive device utilized: None Level of assistance: Complete Independence Comments: no significant deviation  03/20/23  DIAGNOSTIC FINDINGS:  02/21/2023 IMPRESSION: Facet osteoarthritic change at C3-4, C4-5, and C5-6 bilaterally, similar to prior study. No appreciable disc space narrowing. No fracture or spondylolisthesis. There are foci of carotid artery calcification bilaterally.  SENSATION: WFL  POSTURE: rounded shoulders and forward head  PALPATION: Trigger points L UT, L levator scapulae, tenderness, tightness througout bil upper traps, cervical paraspinals, levators.    CERVICAL ROM:   Active ROM A/PROM (deg) eval  Flexion 40p!  Extension 25  Right lateral flexion   Left lateral flexion   Right rotation 50  Left rotation 40p!   (Blank  rows = not tested)  UPPER EXTREMITY ROM:  Active ROM Right eval Left eval  Shoulder flexion 135 135  Shoulder abduct0ion 120 125  Shoulder internal rotation midback midback  Shoulder external rotation T2 T2   (Blank rows = not tested)  UPPER EXTREMITY MMT:  5/5 all dermatomes, good grip strength   CERVICAL SPECIAL TESTS:  Spurling's test: Negative and Sharp pursor's test: Negative   TODAY'S TREATMENT:                                                                                                                              DATE:   03/20/23 REEVALUATION - EVALUATION of cervical spine Manual Therapy: to decrease muscle spasm and pain and improve mobility STM/TPR to bil UT,  L/S, PA mobs thoracic spine grade 1-2, skilled palpation and monitoring during dry needling. Trigger Point Dry-Needling  Treatment instructions: Expect mild to moderate muscle soreness. S/S of pneumothorax if dry needled over a lung field, and to seek immediate medical attention should they occur. Patient verbalized understanding of these instructions and education. Patient Consent Given: Yes Education handout provided: Previously provided Muscles treated: bil UT, L levator scapulae Electrical stimulation performed: No Parameters: N/A Treatment response/outcome: Twitch Response Elicited and Palpable Increase in Muscle Length   03/15/23 Therapeutic Exercise: to improve strength and mobility.  Demo, verbal and tactile cues throughout for technique. Bike L1 x 6 min Elephant walks x 10 Review of HEP Chin tucks x 10 Seated piriformis stretches HEP update Self Care: Education on posture, taking movement breaks throughout the day, to avoid stiffness, do not sit on objects in back pocket. Manual Therapy: to decrease muscle spasm and pain and improve mobility STM/TPR to glutes,  IASTM to L hamstring, skilled palpation and monitoring during dry needling. Trigger Point Dry-Needling  Treatment instructions: Expect mild to moderate muscle soreness. S/S of pneumothorax if dry needled over a lung field, and to seek immediate medical attention should they occur. Patient verbalized understanding of these instructions and education. Patient Consent Given: Yes Education handout provided: Yes Muscles treated: L priformis, L glute med Electrical stimulation performed: No Parameters: N/A Treatment response/outcome: Twitch Response Elicited and Palpable Increase in Muscle Length    03/07/23 Therapeutic Exercise: to improve strength and mobility.  Demo, verbal and tactile cues throughout for technique. Elephant walks x 20 Seated sciatic nerve glide for dynamic HS stretch x 20 bil  Supine hooklying sciatic  nerve glide for dynamic HS stretch x 10 bil  Seated HS stretch x 30 sec bil - improved after dynamic stretching Seated isometric hip adduction with ball 10 x 5 sec hold Ultrasound: x 8 min to L buttock 1 MHz, 1.2 w/cm2 cont to decrease inflammation/pain    PATIENT EDUCATION:  Education details: findings, POC Person educated: Patient Education method: Explanation Education comprehension: verbalized understanding  HOME EXERCISE PROGRAM: Access Code: [email or web address] URL: [email or web address] Date: 03/07/2023 Prepared by: Harrie Foreman  Exercises - Seated Isometric Hip Adduction with  Ball  - 1 x daily - 7 x weekly - 2 sets - 10 reps    ASSESSMENT:  CLINICAL IMPRESSION: PERRIS IMWALLE arrived late today.  She reports significant improvement in pain following TrDN in glutes last session, mostly has pain in bil hamstring attachments now.  She had trigger point injections in R side of neck on 03/15/23 by PCP and was referred for PT for neck, so evaluated her neck today, as still has significant pain on Left side.  She demonstrates significant forward head posture, thoracic kyphosis, decreased cervical ROM and trigger points throughout UT/LS.  Tolerated manual therapy including TrDN and demonstrated improved cervical AROM with less pain following.  Discussed HEP but due to time will go over tomorrow.      EVALENA CLUTE continues to demonstrate potential for improvement and would benefit from continued skilled therapy to address impairments.     OBJECTIVE IMPAIRMENTS: decreased activity tolerance, decreased mobility, decreased ROM, decreased strength, increased muscle spasms, impaired flexibility, and pain.   ACTIVITY LIMITATIONS: lifting, bending, sitting, standing, squatting, sleeping, stairs, bed mobility, and locomotion level  PARTICIPATION LIMITATIONS: meal prep, cleaning, laundry, driving, shopping, community activity, and occupation  PERSONAL FACTORS: Past/current  experiences, Time since onset of injury/illness/exacerbation, and 1-2 comorbidities: ADHD, HTN, T2DM (well controlled), NASH, IBS, GERD, OA L knee, L prepatellar bursitis.  are also affecting patient's functional outcome.   REHAB POTENTIAL: Good  CLINICAL DECISION MAKING: Evolving/moderate complexity  EVALUATION COMPLEXITY: Moderate   GOALS: Goals reviewed with patient? Yes  SHORT TERM GOALS: Target date: 03/20/2023   Patient will be independent with initial HEP. Baseline: needs Goal status: IN PROGRESS 03/15/23 - reviewed  LONG TERM GOALS: Target date: 04/17/2023   Patient will be independent with advanced/ongoing HEP to improve outcomes and carryover.  Baseline:  Goal status: IN PROGRESS  2.  Patient will report at least 75% improvement in L buttock pain to improve QOL. Baseline: 8/10 Goal status: IN PROGRESS  3.  Patient will demonstrate improved functional LE strength as demonstrated by 5/5 LE strength without pain. Baseline: see objective Goal status: IN PROGRESS  4.  Patient will report 9 points improvement on LEFS to demonstrate improved functional ability. Baseline: 34/80 Goal status: IN PROGRESS  5.  Patient will demonstrate at least 25/30 on FGA to decrease risk of falls. Baseline: NT, 2 recent falls.  Goal status: IN PROGRESS   6.  Patient will be able to sit for 30 min without discomfort.  Baseline: had to lean and reposition constantly today due to pain Goal status: IN PROGRESS  7.  Patient will demonstrate 60 deg cervical rotation without pain for safety with driving.  Baseline: see objective Goal status: INITIAL  8.  Patient will report 75% improvement in neck/ upper shoulder pain to improve QOL.   Baseline: having to get trigger point injections, pain from 5-10/10 Goal status: INITIAL    PLAN:  PT FREQUENCY: 1-2x/week  PT DURATION: 6 weeks  PLANNED INTERVENTIONS: 97110-Therapeutic exercises, 97530- Therapeutic activity, 97112- Neuromuscular  re-education, 97535- Self Care, 62703- Manual therapy, [id number]- Gait training, [phone]- Orthotic Fit/training, 97014- Electrical stimulation (unattended), [id number]- Electrical stimulation (manual), [id number]- Ultrasound, [id number]- Traction (mechanical), [id number]- Ionotophoresis 4mg /ml Dexamethasone, Patient/Family education, Balance training, Stair training, Taping, Dry Needling, Joint mobilization, Joint manipulation, Spinal manipulation, Spinal mobilization, Cryotherapy, and Moist heat  PLAN FOR NEXT SESSION: FGA, HEP for cervical stretches.    Jena Gauss, PT, DPT 03/20/2023, 11:44 AM

## 2023-03-21 ENCOUNTER — Ambulatory Visit: Payer: 59 | Admitting: Physical Therapy

## 2023-03-21 ENCOUNTER — Encounter: Payer: Self-pay | Admitting: Physical Therapy

## 2023-03-21 DIAGNOSIS — M6281 Muscle weakness (generalized): Secondary | ICD-10-CM

## 2023-03-21 DIAGNOSIS — M79604 Pain in right leg: Secondary | ICD-10-CM | POA: Diagnosis not present

## 2023-03-21 DIAGNOSIS — R252 Cramp and spasm: Secondary | ICD-10-CM

## 2023-03-21 DIAGNOSIS — M79605 Pain in left leg: Secondary | ICD-10-CM

## 2023-03-21 NOTE — Therapy (Signed)
OUTPATIENT PHYSICAL THERAPY TREATMENT   Patient Name: Patricia Arroyo MRN: [id number] DOB:1965/04/07, 57 y.o., female Today's Date: 03/21/2023  END OF SESSION:  PT End of Session - 03/21/23 0932     Visit Number 5    Date for PT Re-Evaluation 04/17/23    Authorization Type [employer]    PT Start Time 0932    PT Stop Time 1020    PT Time Calculation (min) 48 min    Activity Tolerance Patient tolerated treatment well    Behavior During Therapy WFL for tasks assessed/performed             Past Medical History:  Diagnosis Date   ADHD (attention deficit hyperactivity disorder)    Anxiety    Carpal tunnel syndrome    Depression    h/o---no meds now   Diabetes mellitus without complication (HCC)    GERD (gastroesophageal reflux disease)    Hypertension    IBS (irritable bowel syndrome)    Varicose veins    Past Surgical History:  Procedure Laterality Date   ABDOMINOPLASTY  2020   ANTERIOR AND POSTERIOR REPAIR  03/10/2011   Procedure: ANTERIOR (CYSTOCELE) AND POSTERIOR REPAIR (RECTOCELE);  Surgeon: Turner Daniels, MD;  Location: [facility];  Service: Gynecology;  Laterality: N/A;  with Sacrospinous Ligament Suspension   BREAST REDUCTION SURGERY Bilateral 2020   CARPAL TUNNEL RELEASE Bilateral 04/01/2013   Procedure: BILATERAL CARPAL TUNNEL RELEASE;  Surgeon: Tami Ribas, MD;  Location: [facility];  Service: Orthopedics;  Laterality: Bilateral;   COLONOSCOPY     DILATION AND CURETTAGE OF UTERUS     FOOT FASCIOTOMY  2005   both feet   LAPAROSCOPIC ASSISTED VAGINAL HYSTERECTOMY  03/10/2011   Procedure: LAPAROSCOPIC ASSISTED VAGINAL HYSTERECTOMY;  Surgeon: Turner Daniels, MD;  Location: [facility];  Service: Gynecology;  Laterality: N/A;   NOVASURE ABLATION     SALPINGOOPHORECTOMY  03/10/2011   Procedure: SALPINGO OOPHERECTOMY;  Surgeon: Turner Daniels, MD;  Location: [facility];  Service: Gynecology;  Laterality: Bilateral;   WISDOM TOOTH EXTRACTION     Patient Active Problem List    Diagnosis Date Noted   Posterior pain of left hip 03/09/2023   Hamstring injury, right, sequela 02/21/2023   Cervical spondylosis 09/02/2022   Bilateral buttock pain 06/23/2022   Lumbosacral spondylosis 11/24/2021   Tick bite 11/14/2021   Anterior tibialis tendinitis, left 10/19/2021   Osteoarthritis of both wrists and hands 09/01/2021   Acute meniscal tear of right knee 03/22/2021   Injury of second toe of right foot 08/24/2020   Aortic atherosclerosis (HCC) 05/15/2020   Ureteric stone 04/23/2020   Gross hematuria 04/23/2020   Lower abdominal pain 03/17/2020   Attention deficit hyperactivity disorder, predominantly inattentive type 11/13/2019   Arthritis 11/13/2019   Anxiety 11/13/2019   Left wrist pain 01/10/2019   Disorder of rotator cuff, left 08/17/2017   Well adult exam 05/20/2016   Ganglion cyst of wrist 09/03/2015   Primary osteoarthritis of left knee 05/27/2015   Prepatellar bursitis, left knee 02/06/2015   Nocturnal leg cramps 02/06/2015   Diabetes type 2, controlled (HCC) 01/13/2015   Fatty liver disease, nonalcoholic 01/13/2015   Essential hypertension 04/20/2014   IBS (irritable bowel syndrome) 04/20/2014   GERD (gastroesophageal reflux disease) 04/20/2014   Varicose veins without complication 02/03/2014    PCP: Everrett Coombe, DO   REFERRING PROVIDER: Monica Becton, MD  REFERRING DIAG: S76.301S (ICD-10-CM) - Hamstring injury, right, sequela  S16.1XXA (ICD-10-CM) - Strain of neck  muscle, initial encounter (added 03/15/23)  THERAPY DIAG:  Pain in right leg  Pain in left leg  Muscle weakness (generalized)  Cramp and spasm  Rationale for Evaluation and Treatment: Rehabilitation  ONSET DATE: 02/20/2023 hurt hamstring  SUBJECTIVE:   SUBJECTIVE STATEMENT: Patient reports she felt great last night, able to sleep without warm towel around her neck, a little sore after raking leaves at 10 oclock at night (on a hill).  Discussed falls, several due  to poor decisions, poor lighting, stair everyone has tripped over, etc.       PERTINENT HISTORY: ADHD, HTN, T2DM (well controlled), NASH, IBS, GERD, OA L knee, R knee meniscal tear (repaired beginning of year), L prepatellar bursitis. PAIN:  Are you having pain? Yes: NPRS scale: 2/10 Pain location: R hamstring attachment  Pain description: tender when press on it Aggravating factors: hard surfaces, walking too fast Relieving factors: walking slow  Are you having pain? Yes: NPRS scale: 1.5/10 Pain location: L hamstring attachment Pain description: sore, tender, shoots across hip Aggravating factors: sitting on hard surfaces, jean pocket irritates it, sleeping on side, wakes at night, standing on concrete Relieving factors: ice to numb, trying to cross leg and get weight off it, pillow   Are you having pain? Yes: NPRS scale: 1/10 Pain location: L side of neck   Pain description: spasmed Aggravating factors: turning Relieving factors: heat  PRECAUTIONS: None  RED FLAGS: None   WEIGHT BEARING RESTRICTIONS: No  FALLS:  Has patient fallen in last 6 months? Yes. Number of falls 1 tripped, works around a lot of trip hazards at work  LIVING ENVIRONMENT: Lives with: lives with their spouse Lives in: House/apartment Stairs: Yes: Internal: 2 steps; none Has following equipment at home: None  OCCUPATION: Psychiatric nurse, on feet on concrete long shifts  PLOF: Independent  PATIENT GOALS: feel better and strengthening my legs.   NEXT MD VISIT: 03/13/23 with referring provider  OBJECTIVE:  Note: Objective measures were completed at Evaluation unless otherwise noted.  DIAGNOSTIC FINDINGS: 02/21/23 DG Hip - results not available.   PATIENT SURVEYS:  LEFS 34/80= 42.5%  COGNITION: Overall cognitive status: Within functional limits for tasks assessed     SENSATION: WFL  MUSCLE LENGTH: Hamstrings: Right 60 deg; Left 70 deg  Quadriceps (prone knee bend): Right 65 deg; Left 65   deg  POSTURE: No Significant postural limitations  PALPATION: Extreme tenderness bil at ischial tuberosities,   LOWER EXTREMITY MMT:  MMT Right eval Left eval  Hip flexion 5 p! (Adductor longus) 5p! (Adductor longus)  Hip extension    Hip abduction 5 (seated) 5 (seated)  Hip adduction 4+ (Seated) 4+ (Seated)  Knee flexion 5p! (HS) 5p! (HS)  Knee extension 5 5   (Blank rows = not tested)  LOWER EXTREMITY ROM:  WNL in hips  FUNCTIONAL TESTS:  03/21/23 FGA 25/30  GAIT: Distance walked: 61' Assistive device utilized: None Level of assistance: Complete Independence Comments: no significant deviation  03/20/23  DIAGNOSTIC FINDINGS:  02/21/2023 IMPRESSION: Facet osteoarthritic change at C3-4, C4-5, and C5-6 bilaterally, similar to prior study. No appreciable disc space narrowing. No fracture or spondylolisthesis. There are foci of carotid artery calcification bilaterally.  SENSATION: WFL  POSTURE: rounded shoulders and forward head  PALPATION: Trigger points L UT, L levator scapulae, tenderness, tightness througout bil upper traps, cervical paraspinals, levators.    CERVICAL ROM:   Active ROM A/PROM (deg) eval  Flexion 40p!  Extension 25  Right lateral flexion   Left lateral  flexion   Right rotation 50  Left rotation 40p!   (Blank rows = not tested)  UPPER EXTREMITY ROM:  Active ROM Right eval Left eval  Shoulder flexion 135 135  Shoulder abduct0ion 120 125  Shoulder internal rotation midback midback  Shoulder external rotation T2 T2   (Blank rows = not tested)  UPPER EXTREMITY MMT:  5/5 all dermatomes, good grip strength   CERVICAL SPECIAL TESTS:  Spurling's test: Negative and Sharp pursor's test: Negative   [facility] PT Assessment - 03/21/23 0001       Functional Gait  Assessment   Gait assessed  Yes    Gait Level Surface Walks 20 ft in less than 5.5 sec, no assistive devices, good speed, no evidence for imbalance, normal gait pattern, deviates  no more than 6 in outside of the 12 in walkway width.    Change in Gait Speed Able to smoothly change walking speed without loss of balance or gait deviation. Deviate no more than 6 in outside of the 12 in walkway width.    Gait with Horizontal Head Turns Performs head turns smoothly with slight change in gait velocity (eg, minor disruption to smooth gait path), deviates 6-10 in outside 12 in walkway width, or uses an assistive device.    Gait with Vertical Head Turns Performs head turns with no change in gait. Deviates no more than 6 in outside 12 in walkway width.    Gait and Pivot Turn Pivot turns safely within 3 sec and stops quickly with no loss of balance.    Step Over Obstacle Is able to step over 2 stacked shoe boxes taped together (9 in total height) without changing gait speed. No evidence of imbalance.    Gait with Narrow Base of Support Ambulates 4-7 steps.    Gait with Eyes Closed Walks 20 ft, slow speed, abnormal gait pattern, evidence for imbalance, deviates 10-15 in outside 12 in walkway width. Requires more than 9 sec to ambulate 20 ft.    Ambulating Backwards Walks 20 ft, no assistive devices, good speed, no evidence for imbalance, normal gait    Steps Alternating feet, no rail.    Total Score 25    FGA comment: low risk fall             TODAY'S TREATMENT:                                                                                                                              DATE:   03/21/2023  Therapeutic Activity:  to assess balance FGA 25/30 SLS x 20 L, x 10 R Tandem stance x 15 sec bil   Therapeutic Exercise: to improve strength and mobility.  Demo, verbal and tactile cues throughout for technique. Chin tucks x 5 Retro shoulder rolls x 5 Scap squeezes x 5 Levator stretch 2 x 10 sec bil SCM stretch 2 x 10 sec bil Prone on elbows - neck extension x 10  Review of exercises for carpal tunnel - forearm stretches, tendon glides Manual Therapy: to decrease muscle  spasm and pain and improve mobility STM/TPR to bil UT, cervical paraspinals, skilled palpation and monitoring during dry needling. Trigger Point Dry-Needling  Treatment instructions: Expect mild to moderate muscle soreness. S/S of pneumothorax if dry needled over a lung field, and to seek immediate medical attention should they occur. Patient verbalized understanding of these instructions and education. Patient Consent Given: Yes Education handout provided: Previously provided Muscles treated: R UT Electrical stimulation performed: No Parameters: N/A Treatment response/outcome: Twitch Response Elicited and Palpable Increase in Muscle Length   03/20/23 REEVALUATION - EVALUATION of cervical spine Manual Therapy: to decrease muscle spasm and pain and improve mobility STM/TPR to bil UT, L/S, PA mobs thoracic spine grade 1-2, skilled palpation and monitoring during dry needling. Trigger Point Dry-Needling  Treatment instructions: Expect mild to moderate muscle soreness. S/S of pneumothorax if dry needled over a lung field, and to seek immediate medical attention should they occur. Patient verbalized understanding of these instructions and education. Patient Consent Given: Yes Education handout provided: Previously provided Muscles treated: bil UT, L levator scapulae Electrical stimulation performed: No Parameters: N/A Treatment response/outcome: Twitch Response Elicited and Palpable Increase in Muscle Length   03/15/23 Therapeutic Exercise: to improve strength and mobility.  Demo, verbal and tactile cues throughout for technique. Bike L1 x 6 min Elephant walks x 10 Review of HEP Chin tucks x 10 Seated piriformis stretches HEP update Self Care: Education on posture, taking movement breaks throughout the day, to avoid stiffness, do not sit on objects in back pocket. Manual Therapy: to decrease muscle spasm and pain and improve mobility STM/TPR to glutes,  IASTM to L hamstring, skilled  palpation and monitoring during dry needling. Trigger Point Dry-Needling  Treatment instructions: Expect mild to moderate muscle soreness. S/S of pneumothorax if dry needled over a lung field, and to seek immediate medical attention should they occur. Patient verbalized understanding of these instructions and education. Patient Consent Given: Yes Education handout provided: Yes Muscles treated: L priformis, L glute med Electrical stimulation performed: No Parameters: N/A Treatment response/outcome: Twitch Response Elicited and Palpable Increase in Muscle Length    03/07/23 Therapeutic Exercise: to improve strength and mobility.  Demo, verbal and tactile cues throughout for technique. Elephant walks x 20 Seated sciatic nerve glide for dynamic HS stretch x 20 bil  Supine hooklying sciatic nerve glide for dynamic HS stretch x 10 bil  Seated HS stretch x 30 sec bil - improved after dynamic stretching Seated isometric hip adduction with ball 10 x 5 sec hold Ultrasound: x 8 min to L buttock 1 MHz, 1.2 w/cm2 cont to decrease inflammation/pain    PATIENT EDUCATION:  Education details: HEP update Person educated: Patient Education method: Explanation, Demonstration, Verbal cues, and Handouts Education comprehension: verbalized understanding and returned demonstration  HOME EXERCISE PROGRAM: Access Code: [email or web address] URL: [email or web address] Date: 03/21/2023 Prepared by: Harrie Foreman  Exercises - Seated Isometric Hip Adduction with Newman Pies  - 1 x daily - 7 x weekly - 2 sets - 10 reps - Seated Hamstring Stretch  - 1 x daily - 7 x weekly - 1 sets - 3 reps - 10 sec hold - Seated Piriformis Stretch  - 1 x daily - 7 x weekly - 1 sets - 3 reps - 10 sec hold - Seated Piriformis Stretch with Trunk Bend  - 1 x daily - 7 x weekly - 1 sets - 3 reps - 10 sec  hold - Seated Cervical Retraction  - 5 x daily - 7 x weekly - 1 sets - 5 reps - Seated Scapular Retraction  - 5 x daily - 7 x  weekly - 1 sets - 5 reps - Seated Shoulder Rolls  - 5 x daily - 7 x weekly - 1 sets - 5 reps - Gentle Levator Scapulae Stretch  - 1 x daily - 7 x weekly - 1 sets - 3 reps - 10-15 sec hold - Cervical Extension Prone on Elbows  - 1 x daily - 7 x weekly - 1 sets - 10 reps    [email or web address].org/globalassets/pdfs/[email or web address]_[email or web address]-program-[email or web address]-[email or web address]_final.[email or web address] ASSESSMENT:  CLINICAL IMPRESSION: Today assessed balance due to history of frequent falls.  She scored 25/30 on FGA placing her at low risk for falls, meeting LTG #5, challenged primarily with tandem gait and gait with eyes closed.  She is also extremely impulsive and had diagnosed ADHD, for example was raking leaves last night after a rain on a hill in the dark.  She reported significant improvement overall in neck pain after yesterday's interventions, today given exercises for postural to perform quickly at work to decrease muscle strain.  Noted tingling in hands with prone on elbows, her work is very repetitive and she has history of CTS and release as well, so reviewed carpal tunnel exercises as well with emphasis to perform very gently to stretch and warm-up hands.  Brief manual therapy and TrDN to R UT only today.    Patricia Arroyo continues to demonstrate potential for improvement and would benefit from continued skilled therapy to address impairments.     OBJECTIVE IMPAIRMENTS: decreased activity tolerance, decreased mobility, decreased ROM, decreased strength, increased muscle spasms, impaired flexibility, and pain.   ACTIVITY LIMITATIONS: lifting, bending, sitting, standing, squatting, sleeping, stairs, bed mobility, and locomotion level  PARTICIPATION LIMITATIONS: meal prep, cleaning, laundry, driving, shopping, community activity, and occupation  PERSONAL FACTORS: Past/current experiences, Time since onset of injury/illness/exacerbation, and 1-2 comorbidities: ADHD, HTN, T2DM (well controlled), NASH, IBS, GERD,  OA L knee, L prepatellar bursitis.  are also affecting patient's functional outcome.   REHAB POTENTIAL: Good  CLINICAL DECISION MAKING: Evolving/moderate complexity  EVALUATION COMPLEXITY: Moderate   GOALS: Goals reviewed with patient? Yes  SHORT TERM GOALS: Target date: 03/20/2023   Patient will be independent with initial HEP. Baseline: needs Goal status: IN PROGRESS 03/15/23 - reviewed 03/20/23- met   LONG TERM GOALS: Target date: 04/17/2023   Patient will be independent with advanced/ongoing HEP to improve outcomes and carryover.  Baseline:  Goal status: IN PROGRESS  2.  Patient will report at least 75% improvement in L buttock pain to improve QOL. Baseline: 8/10 Goal status: IN PROGRESS 03/21/23  1-2/10 pain  3.  Patient will demonstrate improved functional LE strength as demonstrated by 5/5 LE strength without pain. Baseline: see objective Goal status: IN PROGRESS  4.  Patient will report 9 points improvement on LEFS to demonstrate improved functional ability. Baseline: 34/80 Goal status: IN PROGRESS  5.  Patient will demonstrate at least 25/30 on FGA to decrease risk of falls. Baseline: NT, 2 recent falls.  Goal status: MET 03/21/23 - 25/30.     6.  Patient will be able to sit for 30 min without discomfort.  Baseline: had to lean and reposition constantly today due to pain Goal status: IN PROGRESS  7.  Patient will demonstrate 60 deg cervical rotation without pain for safety with driving.  Baseline: see objective Goal status: IN PROGRESS  8.  Patient will report 75% improvement in neck/ upper shoulder pain to improve QOL.   Baseline: having to get trigger point injections, pain from 5-10/10 Goal status: IN PROGRESS    PLAN:  PT FREQUENCY: 1-2x/week  PT DURATION: 6 weeks  PLANNED INTERVENTIONS: 97110-Therapeutic exercises, 97530- Therapeutic activity, [id number]- Neuromuscular re-education, 97535- Self Care, 91478- Manual therapy, [id number]- Gait training,  [phone]- Orthotic Fit/training, 97014- Electrical stimulation (unattended), [id number]- Electrical stimulation (manual), [id number]- Ultrasound, [id number]- Traction (mechanical), [id number]- Ionotophoresis 4mg /ml Dexamethasone, Patient/Family education, Balance training, Stair training, Taping, Dry Needling, Joint mobilization, Joint manipulation, Spinal manipulation, Spinal mobilization, Cryotherapy, and Moist heat  PLAN FOR NEXT SESSION: progress hip/glute strengthening, manual therapy, TrDN PRN.    Jena Gauss, PT, DPT 03/21/2023, 11:29 AM

## 2023-03-27 ENCOUNTER — Ambulatory Visit: Payer: 59 | Admitting: Surgery

## 2023-03-31 ENCOUNTER — Ambulatory Visit: Payer: 59 | Admitting: Physical Therapy

## 2023-03-31 ENCOUNTER — Encounter: Payer: Self-pay | Admitting: Physical Therapy

## 2023-03-31 ENCOUNTER — Ambulatory Visit (INDEPENDENT_AMBULATORY_CARE_PROVIDER_SITE_OTHER): Payer: 59 | Admitting: Sports Medicine

## 2023-03-31 DIAGNOSIS — S76311A Strain of muscle, fascia and tendon of the posterior muscle group at thigh level, right thigh, initial encounter: Secondary | ICD-10-CM | POA: Diagnosis not present

## 2023-03-31 DIAGNOSIS — R252 Cramp and spasm: Secondary | ICD-10-CM

## 2023-03-31 DIAGNOSIS — M47812 Spondylosis without myelopathy or radiculopathy, cervical region: Secondary | ICD-10-CM

## 2023-03-31 DIAGNOSIS — M6281 Muscle weakness (generalized): Secondary | ICD-10-CM

## 2023-03-31 DIAGNOSIS — M79604 Pain in right leg: Secondary | ICD-10-CM

## 2023-03-31 DIAGNOSIS — M79605 Pain in left leg: Secondary | ICD-10-CM

## 2023-03-31 MED ORDER — TRAMADOL HCL 50 MG PO TABS: 50.0000 mg | ORAL_TABLET | Freq: Three times a day (TID) | ORAL | 0 refills | Status: AC | PRN

## 2023-03-31 NOTE — Progress Notes (Signed)
    Procedures performed today:    None.  Independent interpretation of notes and tests performed by another provider:   None.  Brief History, Exam, Impression, and Recommendations:    Tear of right hamstring Patricia Arroyo returns, she is a pleasant 57 year old female with chronic bilateral hip pain, left worse than right, she did have some right hamstring pain that did improve considerably after feeling a pop. We have asked her to continue physical therapy, but she is really not progressing. We did obtain an MRI of the left hip, incidentally noted was a complete retracted hamstring tear on the right as well as partial tearing and tendinosis of the hamstring on the left. I advised her we would go and get a consult from Dr. Dion Saucier, but I did suspect that she would need extensive and prolonged eccentric physical therapy. We will see her back on an as-needed basis after she finishes therapy at her consultation with Dr. Dion Saucier.  Cervical spondylosis Patricia Arroyo also has multilevel cervical spondylosis noted on MRI from 2020, she has intermittent bilateral paracervical spasm, earlier this month we did 3 paraspinal trigger point injections, she continues with physical therapy. If she does not have sufficient improvement we will consider multilevel cervical facet joint injections.    ____________________________________________ [name]. Benjamin Stain, M.D., ABFM., CAQSM., AME. Primary Care and Sports Medicine [facility]  Adjunct Professor of Family Medicine  [place] [employer]  Restaurant manager, fast food

## 2023-03-31 NOTE — Assessment & Plan Note (Signed)
Patricia Arroyo also has multilevel cervical spondylosis noted on MRI from 2020, she has intermittent bilateral paracervical spasm, earlier this month we did 3 paraspinal trigger point injections, she continues with physical therapy. If she does not have sufficient improvement we will consider multilevel cervical facet joint injections.

## 2023-03-31 NOTE — Assessment & Plan Note (Signed)
Patricia Arroyo returns, she is a pleasant 57 year old female with chronic bilateral hip pain, left worse than right, she did have some right hamstring pain that did improve considerably after feeling a pop. We have asked her to continue physical therapy, but she is really not progressing. We did obtain an MRI of the left hip, incidentally noted was a complete retracted hamstring tear on the right as well as partial tearing and tendinosis of the hamstring on the left. I advised her we would go and get a consult from Dr. Dion Saucier, but I did suspect that she would need extensive and prolonged eccentric physical therapy. We will see her back on an as-needed basis after she finishes therapy at her consultation with Dr. Dion Saucier.

## 2023-03-31 NOTE — Patient Instructions (Signed)

## 2023-03-31 NOTE — Therapy (Addendum)
 OUTPATIENT PHYSICAL THERAPY TREATMENT/Discharge Summary   Patient Name: ELLICE Arroyo MRN: [id number] DOB:1965-10-31, 57 y.o., female Today's Date: 03/31/2023  END OF SESSION:  PT End of Session - 03/31/23 0938     Visit Number 6    Date for PT Re-Evaluation 04/17/23    Authorization Type [employer]    PT Start Time [phone]    PT Stop Time 1015    PT Time Calculation (min) 39 min    Activity Tolerance Patient tolerated treatment well    Behavior During Therapy WFL for tasks assessed/performed             Past Medical History:  Diagnosis Date   ADHD (attention deficit hyperactivity disorder)    Anxiety    Carpal tunnel syndrome    Depression    h/o---no meds now   Diabetes mellitus without complication (HCC)    GERD (gastroesophageal reflux disease)    Hypertension    IBS (irritable bowel syndrome)    Varicose veins    Past Surgical History:  Procedure Laterality Date   ABDOMINOPLASTY  2020   ANTERIOR AND POSTERIOR REPAIR  03/10/2011   Procedure: ANTERIOR (CYSTOCELE) AND POSTERIOR REPAIR (RECTOCELE);  Surgeon: Turner Daniels, MD;  Location: [facility];  Service: Gynecology;  Laterality: N/A;  with Sacrospinous Ligament Suspension   BREAST REDUCTION SURGERY Bilateral 2020   CARPAL TUNNEL RELEASE Bilateral 04/01/2013   Procedure: BILATERAL CARPAL TUNNEL RELEASE;  Surgeon: Tami Ribas, MD;  Location:  S[facility]ERY [facility];  Service: Orthopedics;  Laterality: Bilateral;   COLONOSCOPY     DILATION AND CURETTAGE OF UTERUS     FOOT FASCIOTOMY  2005   both feet   LAPAROSCOPIC ASSISTED VAGINAL HYSTERECTOMY  03/10/2011   Procedure: LAPAROSCOPIC ASSISTED VAGINAL HYSTERECTOMY;  Surgeon: Turner Daniels, MD;  Location: [facility];  Service: Gynecology;  Laterality: N/A;   NOVASURE ABLATION     SALPINGOOPHORECTOMY  03/10/2011   Procedure: SALPINGO OOPHERECTOMY;  Surgeon: Turner Daniels, MD;  Location: [facility];  Service: Gynecology;  Laterality: Bilateral;   WISDOM TOOTH EXTRACTION     Patient  Active Problem List   Diagnosis Date Noted   Posterior pain of left hip 03/09/2023   Hamstring injury, right, sequela 02/21/2023   Cervical spondylosis 09/02/2022   Bilateral buttock pain 06/23/2022   Lumbosacral spondylosis 11/24/2021   Tick bite 11/14/2021   Anterior tibialis tendinitis, left 10/19/2021   Osteoarthritis of both wrists and hands 09/01/2021   Acute meniscal tear of right knee 03/22/2021   Injury of second toe of right foot 08/24/2020   Aortic atherosclerosis (HCC) 05/15/2020   Ureteric stone 04/23/2020   Gross hematuria 04/23/2020   Lower abdominal pain 03/17/2020   Attention deficit hyperactivity disorder, predominantly inattentive type 11/13/2019   Arthritis 11/13/2019   Anxiety 11/13/2019   Left wrist pain 01/10/2019   Disorder of rotator cuff, left 08/17/2017   Well adult exam 05/20/2016   Ganglion cyst of wrist 09/03/2015   Primary osteoarthritis of left knee 05/27/2015   Prepatellar bursitis, left knee 02/06/2015   Nocturnal leg cramps 02/06/2015   Diabetes type 2, controlled (HCC) 01/13/2015   Fatty liver disease, nonalcoholic 01/13/2015   Essential hypertension 04/20/2014   IBS (irritable bowel syndrome) 04/20/2014   GERD (gastroesophageal reflux disease) 04/20/2014   Varicose veins without complication 02/03/2014    PCP: Everrett Coombe, DO   REFERRING PROVIDER: Monica Becton, MD  REFERRING DIAG: S76.301S (ICD-10-CM) - Hamstring injury, right, sequela  S16.1XXA (ICD-10-CM) - Strain of  neck muscle, initial encounter (added 03/15/23)  THERAPY DIAG:  Pain in right leg  Pain in left leg  Muscle weakness (generalized)  Cramp and spasm  Rationale for Evaluation and Treatment: Rehabilitation  ONSET DATE: 02/20/2023 hurt hamstring  SUBJECTIVE:   SUBJECTIVE STATEMENT: Got the MRI result back and has tear in her R hamstring, but the L side hurts worse.  She was in woods clearing up and slipped on leaves on hillside and did a split and  hit head.  Neck still hurts.  Sick today, wearing mask.       PERTINENT HISTORY: ADHD, HTN, T2DM (well controlled), NASH, IBS, GERD, OA L knee, R knee meniscal tear (repaired beginning of year), L prepatellar bursitis. PAIN:  Are you having pain? Yes: NPRS scale: 2/10 Pain location: R hamstring attachment  Pain description: tender when press on it Aggravating factors: hard surfaces, walking too fast Relieving factors: walking slow  Are you having pain? Yes: NPRS scale: 2/10 Pain location: L hamstring attachment Pain description: sore, tender, shoots across hip Aggravating factors: sitting on hard surfaces, jean pocket irritates it, sleeping on side, wakes at night, standing on concrete Relieving factors: ice to numb, trying to cross leg and get weight off it, pillow   Are you having pain? Yes: NPRS scale: 1/10 Pain location: L side of neck   Pain description: spasmed Aggravating factors: turning Relieving factors: heat  PRECAUTIONS: None  RED FLAGS: None   WEIGHT BEARING RESTRICTIONS: No  FALLS:  Has patient fallen in last 6 months? Yes. Number of falls 1 tripped, works around a lot of trip hazards at work  LIVING ENVIRONMENT: Lives with: lives with their spouse Lives in: House/apartment Stairs: Yes: Internal: 2 steps; none Has following equipment at home: None  OCCUPATION: Psychiatric nurse, on feet on concrete long shifts  PLOF: Independent  PATIENT GOALS: feel better and strengthening my legs.   NEXT MD VISIT: 03/13/23 with referring provider  OBJECTIVE:  Note: Objective measures were completed at Evaluation unless otherwise noted.  DIAGNOSTIC FINDINGS: 02/21/23 DG Hip - results not available.   PATIENT SURVEYS:  LEFS 34/80= 42.5%  COGNITION: Overall cognitive status: Within functional limits for tasks assessed     SENSATION: WFL  MUSCLE LENGTH: Hamstrings: Right 60 deg; Left 70 deg  Quadriceps (prone knee bend): Right 65 deg; Left 65  deg  POSTURE: No  Significant postural limitations  PALPATION: Extreme tenderness bil at ischial tuberosities,   LOWER EXTREMITY MMT:  MMT Right eval Left eval  Hip flexion 5 p! (Adductor longus) 5p! (Adductor longus)  Hip extension    Hip abduction 5 (seated) 5 (seated)  Hip adduction 4+ (Seated) 4+ (Seated)  Knee flexion 5p! (HS) 5p! (HS)  Knee extension 5 5   (Blank rows = not tested)  LOWER EXTREMITY ROM:  WNL in hips  FUNCTIONAL TESTS:  03/21/23 FGA 25/30  GAIT: Distance walked: 48' Assistive device utilized: None Level of assistance: Complete Independence Comments: no significant deviation  03/20/23  DIAGNOSTIC FINDINGS:  02/21/2023 IMPRESSION: Facet osteoarthritic change at C3-4, C4-5, and C5-6 bilaterally, similar to prior study. No appreciable disc space narrowing. No fracture or spondylolisthesis. There are foci of carotid artery calcification bilaterally.  03/13/23 MR L hip IMPRESSION: 1. Near complete tear of the right hamstrings from the ischial tuberosity with 1-2 cm of tendon retraction. 2. Mild-to-moderate right hip osteoarthritis with degenerative tearing of the anterior and superior labrum. 3. Left hamstring tendinosis without tear.  SENSATION: WFL  POSTURE: rounded shoulders and  forward head  PALPATION: Trigger points L UT, L levator scapulae, tenderness, tightness througout bil upper traps, cervical paraspinals, levators.    CERVICAL ROM:   Active ROM A/PROM (deg) eval  Flexion 40p!  Extension 25  Right lateral flexion   Left lateral flexion   Right rotation 50  Left rotation 40p!   (Blank rows = not tested)  UPPER EXTREMITY ROM:  Active ROM Right eval Left eval  Shoulder flexion 135 135  Shoulder abduct0ion 120 125  Shoulder internal rotation midback midback  Shoulder external rotation T2 T2   (Blank rows = not tested)  UPPER EXTREMITY MMT:  5/5 all dermatomes, good grip strength   CERVICAL SPECIAL TESTS:  Spurling's test: Negative  and Sharp pursor's test: Negative     TODAY'S TREATMENT:                                                                                                                              DATE:   03/31/23 Therapeutic Exercise: to improve strength and mobility.  Demo, verbal and tactile cues throughout for technique. Bike L1 x 6 min seat 4 Review of HEP Manual Therapy: to decrease muscle spasm and pain and improve mobility STM/TPR to bil UT, levators, cervical paraspinals, PA/UPA mobs thoracic spine, skilled palpation and monitoring during dry needling. Trigger Point Dry-Needling  Treatment instructions: Expect mild to moderate muscle soreness. S/S of pneumothorax if dry needled over a lung field, and to seek immediate medical attention should they occur. Patient verbalized understanding of these instructions and education.  Patient Consent Given: Yes Education handout provided: Yes - updated handout provided Muscles treated: bil UT, L/S, C5 multifidi bil Electrical stimulation performed: No Parameters: N/A Treatment response/outcome: Twitch Response Elicited and Palpable Increase in Muscle Length  03/21/2023  Therapeutic Activity:  to assess balance FGA 25/30 SLS x 20 L, x 10 R Tandem stance x 15 sec bil   Therapeutic Exercise: to improve strength and mobility.  Demo, verbal and tactile cues throughout for technique. Chin tucks x 5 Retro shoulder rolls x 5 Scap squeezes x 5 Levator stretch 2 x 10 sec bil SCM stretch 2 x 10 sec bil Prone on elbows - neck extension x 10 Review of exercises for carpal tunnel - forearm stretches, tendon glides Manual Therapy: to decrease muscle spasm and pain and improve mobility STM/TPR to bil UT, cervical paraspinals, skilled palpation and monitoring during dry needling. Trigger Point Dry-Needling  Treatment instructions: Expect mild to moderate muscle soreness. S/S of pneumothorax if dry needled over a lung field, and to seek immediate medical  attention should they occur. Patient verbalized understanding of these instructions and education. Patient Consent Given: Yes Education handout provided: Previously provided Muscles treated: R UT Electrical stimulation performed: No Parameters: N/A Treatment response/outcome: Twitch Response Elicited and Palpable Increase in Muscle Length   03/20/23 REEVALUATION - EVALUATION of cervical spine Manual Therapy: to decrease muscle spasm and pain and improve mobility STM/TPR to  bil UT, L/S, PA mobs thoracic spine grade 1-2, skilled palpation and monitoring during dry needling. Trigger Point Dry-Needling  Treatment instructions: Expect mild to moderate muscle soreness. S/S of pneumothorax if dry needled over a lung field, and to seek immediate medical attention should they occur. Patient verbalized understanding of these instructions and education. Patient Consent Given: Yes Education handout provided: Previously provided Muscles treated: bil UT, L levator scapulae Electrical stimulation performed: No Parameters: N/A Treatment response/outcome: Twitch Response Elicited and Palpable Increase in Muscle Length   03/15/23 Therapeutic Exercise: to improve strength and mobility.  Demo, verbal and tactile cues throughout for technique. Bike L1 x 6 min Elephant walks x 10 Review of HEP Chin tucks x 10 Seated piriformis stretches HEP update Self Care: Education on posture, taking movement breaks throughout the day, to avoid stiffness, do not sit on objects in back pocket. Manual Therapy: to decrease muscle spasm and pain and improve mobility STM/TPR to glutes,  IASTM to L hamstring, skilled palpation and monitoring during dry needling. Trigger Point Dry-Needling  Treatment instructions: Expect mild to moderate muscle soreness. S/S of pneumothorax if dry needled over a lung field, and to seek immediate medical attention should they occur. Patient verbalized understanding of these instructions and  education. Patient Consent Given: Yes Education handout provided: Yes Muscles treated: L priformis, L glute med Electrical stimulation performed: No Parameters: N/A Treatment response/outcome: Twitch Response Elicited and Palpable Increase in Muscle Length    03/07/23 Therapeutic Exercise: to improve strength and mobility.  Demo, verbal and tactile cues throughout for technique. Elephant walks x 20 Seated sciatic nerve glide for dynamic HS stretch x 20 bil  Supine hooklying sciatic nerve glide for dynamic HS stretch x 10 bil  Seated HS stretch x 30 sec bil - improved after dynamic stretching Seated isometric hip adduction with ball 10 x 5 sec hold Ultrasound: x 8 min to L buttock 1 MHz, 1.2 w/cm2 cont to decrease inflammation/pain    PATIENT EDUCATION:  Education details: HEP review  Person educated: Patient Education method: Medical illustrator Education comprehension: verbalized understanding and returned demonstration  HOME EXERCISE PROGRAM: Access Code: [email or web address] URL: [email or web address] Date: 03/21/2023 Prepared by: Harrie Foreman  Exercises - Seated Isometric Hip Adduction with Newman Pies  - 1 x daily - 7 x weekly - 2 sets - 10 reps - Seated Hamstring Stretch  - 1 x daily - 7 x weekly - 1 sets - 3 reps - 10 sec hold - Seated Piriformis Stretch  - 1 x daily - 7 x weekly - 1 sets - 3 reps - 10 sec hold - Seated Piriformis Stretch with Trunk Bend  - 1 x daily - 7 x weekly - 1 sets - 3 reps - 10 sec  hold - Seated Cervical Retraction  - 5 x daily - 7 x weekly - 1 sets - 5 reps - Seated Scapular Retraction  - 5 x daily - 7 x weekly - 1 sets - 5 reps - Seated Shoulder Rolls  - 5 x daily - 7 x weekly - 1 sets - 5 reps - Gentle Levator Scapulae Stretch  - 1 x daily - 7 x weekly - 1 sets - 3 reps - 10-15 sec hold - Cervical Extension Prone on Elbows  - 1 x daily - 7 x weekly - 1 sets - 10  reps    [email or web address].org/globalassets/pdfs/[email or web address]_therapeutic-[email or web address]-[email or web address]_[email or web address] ASSESSMENT:  CLINICAL IMPRESSION: Makylie reports another slip on hill with leaves with injury, recommended staying away from leaves and  hills in the future.  She is seeing Dr. Karie Schwalbe after this visit to follow-up about MRI results and continued hip/hamstring pain.  Today focused on neck/UT pain, focusing on manual therapy, as Adelyn complaining of illness.  Good response to manual and TrDN.     HAMNA ASA continues to demonstrate potential for improvement and would benefit from continued skilled therapy to address impairments.     OBJECTIVE IMPAIRMENTS: decreased activity tolerance, decreased mobility, decreased ROM, decreased strength, increased muscle spasms, impaired flexibility, and pain.   ACTIVITY LIMITATIONS: lifting, bending, sitting, standing, squatting, sleeping, stairs, bed mobility, and locomotion level  PARTICIPATION LIMITATIONS: meal prep, cleaning, laundry, driving, shopping, community activity, and occupation  PERSONAL FACTORS: Past/current experiences, Time since onset of injury/illness/exacerbation, and 1-2 comorbidities: ADHD, HTN, T2DM (well controlled), NASH, IBS, GERD, OA L knee, L prepatellar bursitis.  are also affecting patient's functional outcome.   REHAB POTENTIAL: Good  CLINICAL DECISION MAKING: Evolving/moderate complexity  EVALUATION COMPLEXITY: Moderate   GOALS: Goals reviewed with patient? Yes  SHORT TERM GOALS: Target date: 03/20/2023   Patient will be independent with initial HEP. Baseline: needs Goal status: IN PROGRESS 03/15/23 - reviewed 03/20/23- met   LONG TERM GOALS: Target date: 04/17/2023   Patient will be independent with advanced/ongoing HEP to improve outcomes and carryover.  Baseline:  Goal status: IN PROGRESS  2.  Patient will report at least 75% improvement in L buttock pain to improve QOL. Baseline: 8/10 Goal  status: IN PROGRESS 03/21/23  1-2/10 pain  3.  Patient will demonstrate improved functional LE strength as demonstrated by 5/5 LE strength without pain. Baseline: see objective Goal status: IN PROGRESS  4.  Patient will report 9 points improvement on LEFS to demonstrate improved functional ability. Baseline: 34/80 Goal status: IN PROGRESS  5.  Patient will demonstrate at least 25/30 on FGA to decrease risk of falls. Baseline: NT, 2 recent falls.  Goal status: MET 03/21/23 - 25/30.     6.  Patient will be able to sit for 30 min without discomfort.  Baseline: had to lean and reposition constantly today due to pain Goal status: IN PROGRESS  7.  Patient will demonstrate 60 deg cervical rotation without pain for safety with driving.  Baseline: see objective Goal status: IN PROGRESS  8.  Patient will report 75% improvement in neck/ upper shoulder pain to improve QOL.   Baseline: having to get trigger point injections, pain from 5-10/10 Goal status: IN PROGRESS    PLAN:  PT FREQUENCY: 1-2x/week  PT DURATION: 6 weeks  PLANNED INTERVENTIONS: 97110-Therapeutic exercises, 97530- Therapeutic activity, 97112- Neuromuscular re-education, 97535- Self Care, 40981- Manual therapy, [id number]- Gait training, [phone]- Orthotic Fit/training, 97014- Electrical stimulation (unattended), [id number]- Electrical stimulation (manual), [id number]- Ultrasound, [id number]- Traction (mechanical), [id number]- Ionotophoresis 4mg /ml Dexamethasone, Patient/Family education, Balance training, Stair training, Taping, Dry Needling, Joint mobilization, Joint manipulation, Spinal manipulation, Spinal mobilization, Cryotherapy, and Moist heat  PLAN FOR NEXT SESSION: progress hip/glute strengthening, manual therapy, TrDN PRN.    Jena Gauss, PT, DPT 03/31/2023, 10:26 AM  PHYSICAL THERAPY DISCHARGE SUMMARY  Visits from Start of Care: 6  Current functional level related to goals / functional outcomes: See above   Remaining  deficits: See above   Education / Equipment: HEP  Plan:   Patient goals were not met. Patient is being discharged due to not returning to therapy after 03/31/2023.    Jena Gauss, PT  05/25/2023 4:52 PM

## 2023-04-10 ENCOUNTER — Ambulatory Visit: Payer: 59 | Admitting: Surgery

## 2023-04-26 ENCOUNTER — Encounter: Payer: Self-pay | Admitting: Sports Medicine

## 2023-04-26 ENCOUNTER — Ambulatory Visit: Payer: 59 | Admitting: Sports Medicine

## 2023-04-26 DIAGNOSIS — S76311A Strain of muscle, fascia and tendon of the posterior muscle group at thigh level, right thigh, initial encounter: Secondary | ICD-10-CM | POA: Diagnosis not present

## 2023-04-26 NOTE — Assessment & Plan Note (Signed)
58 year old female, chronic bilateral posterior hip pain left worse than right, ultimately MRI did show complete tearing of the right hamstring from the ischial tuberosity and partial tearing of the left, she did see Dr. Dion Saucier, he and I both agreed that surgery was minimally efficacious and that this would need time to heal on its own, Dr. Dion Saucier has suggested 6 months. She had another slip in the ice recently, increasing pain, on exam she does have tenderness in the right ischial tuberosity with weakness to resisted right knee flexion. I have suggested she get a thigh compression sleeve for now.

## 2023-04-26 NOTE — Progress Notes (Signed)
    Procedures performed today:    None.  Independent interpretation of notes and tests performed by another provider:   None.  Brief History, Exam, Impression, and Recommendations:    Tear of right hamstring 58 year old female, chronic bilateral posterior hip pain left worse than right, ultimately MRI did show complete tearing of the right hamstring from the ischial tuberosity and partial tearing of the left, she did see Dr. Dion Saucier, he and I both agreed that surgery was minimally efficacious and that this would need time to heal on its own, Dr. Dion Saucier has suggested 6 months. She had another slip in the ice recently, increasing pain, on exam she does have tenderness in the right ischial tuberosity with weakness to resisted right knee flexion. I have suggested she get a thigh compression sleeve for now.    ____________________________________________ [name]. Benjamin Stain, M.D., ABFM., CAQSM., AME. Primary Care and Sports Medicine [facility]  Adjunct Professor of Family Medicine  [employer]  Restaurant manager, fast food

## 2023-05-18 ENCOUNTER — Telehealth: Payer: Self-pay | Admitting: Sports Medicine

## 2023-05-18 DIAGNOSIS — S76311A Strain of muscle, fascia and tendon of the posterior muscle group at thigh level, right thigh, initial encounter: Secondary | ICD-10-CM

## 2023-05-18 NOTE — Telephone Encounter (Signed)
Patient comes in with increasing pain, requesting referral for formal physical therapy.

## 2023-05-20 LAB — CMP14+EGFR
ALT: 29 [IU]/L (ref 0–32)
AST: 25 [IU]/L (ref 0–40)
Albumin: 4.3 g/dL (ref 3.8–4.9)
Alkaline Phosphatase: 109 [IU]/L (ref 44–121)
BUN/Creatinine Ratio: 22 (ref 9–23)
BUN: 18 mg/dL (ref 6–24)
Bilirubin Total: 0.4 mg/dL (ref 0.0–1.2)
CO2: 26 mmol/L (ref 20–29)
Calcium: 9.3 mg/dL (ref 8.7–10.2)
Chloride: 101 mmol/L (ref 96–106)
Creatinine, Ser: 0.81 mg/dL (ref 0.57–1.00)
Globulin, Total: 2.3 g/dL (ref 1.5–4.5)
Glucose: 93 mg/dL (ref 70–99)
Potassium: 3.6 mmol/L (ref 3.5–5.2)
Sodium: 142 mmol/L (ref 134–144)
Total Protein: 6.6 g/dL (ref 6.0–8.5)
eGFR: 84 mL/min/{1.73_m2} (ref >59)

## 2023-05-20 LAB — CBC WITH DIFFERENTIAL/PLATELET
Basophils Absolute: 0 10*3/uL (ref 0.0–0.2)
Basos: 1 %
EOS (ABSOLUTE): 0 10*3/uL (ref 0.0–0.4)
Eos: 1 %
Hematocrit: 43.9 % (ref 34.0–46.6)
Hemoglobin: 14.5 g/dL (ref 11.1–15.9)
Immature Grans (Abs): 0 10*3/uL (ref 0.0–0.1)
Immature Granulocytes: 0 %
Lymphocytes Absolute: 1.8 10*3/uL (ref 0.7–3.1)
Lymphs: 27 %
MCH: 30.4 pg (ref 26.6–33.0)
MCHC: 33 g/dL (ref 31.5–35.7)
MCV: 92 fL (ref 79–97)
Monocytes Absolute: 0.5 10*3/uL (ref 0.1–0.9)
Monocytes: 7 %
Neutrophils Absolute: 4.3 10*3/uL (ref 1.4–7.0)
Neutrophils: 64 %
Platelets: 280 10*3/uL (ref 150–450)
RBC: 4.77 x10E6/uL (ref 3.77–5.28)
RDW: 12.6 % (ref 11.7–15.4)
WBC: 6.7 10*3/uL (ref 3.4–10.8)

## 2023-05-20 LAB — LIPID PANEL WITH LDL/HDL RATIO
Cholesterol, Total: 219 mg/dL — ABNORMAL HIGH (ref 100–199)
HDL: 68 mg/dL (ref >39)
LDL Chol Calc (NIH): 136 mg/dL — ABNORMAL HIGH (ref 0–99)
LDL/HDL Ratio: 2 {ratio} (ref 0.0–3.2)
Triglycerides: 83 mg/dL (ref 0–149)
VLDL Cholesterol Cal: 15 mg/dL (ref 5–40)

## 2023-05-20 LAB — MICROALBUMIN / CREATININE URINE RATIO
Creatinine, Urine: 133.3 mg/dL
Microalb/Creat Ratio: 4 mg/g{creat} (ref 0–29)
Microalbumin, Urine: 5.9 ug/mL

## 2023-05-20 LAB — TSH: TSH: 1.29 u[IU]/mL (ref 0.450–4.500)

## 2023-05-20 LAB — HEMOGLOBIN A1C
Est. average glucose Bld gHb Est-mCnc: 131 mg/dL
Hgb A1c MFr Bld: 6.2 % — ABNORMAL HIGH (ref 4.8–5.6)

## 2023-05-20 LAB — VITAMIN D 25 HYDROXY (VIT D DEFICIENCY, FRACTURES): Vit D, 25-Hydroxy: 37.4 ng/mL (ref 30.0–100.0)

## 2023-05-24 ENCOUNTER — Ambulatory Visit: Payer: 59 | Admitting: Sports Medicine

## 2023-05-24 DIAGNOSIS — S76311A Strain of muscle, fascia and tendon of the posterior muscle group at thigh level, right thigh, initial encounter: Secondary | ICD-10-CM

## 2023-05-24 NOTE — Assessment & Plan Note (Signed)
 This is a very pleasant 58 year old female, she has chronic bilateral posterior hip pain, ultimately MRI did show complete tearing of the right hamstring from the ischial tuberosity, partial tearing on the left, we got a consult with Dr. Dion Saucier, he and I both agree that surgery was minimally efficacious and that this would heal on its own. Dr. Dion Saucier has suggested 6 months of conservative treatment and I agree. She then had another slip and fall in the ice, with increasing pain. She is able to ambulate, but has some discomfort in the buttock when rising from a seated position. I did suggest tramadol for pain relief in the meantime as well as bilateral thigh compression sleeves She has not done either, we had a long discussion, she will start the tramadol, she will do her thigh compression sleeves and we can follow this up more in an as-needed basis per She does have an appointment coming up with Dr. Dion Saucier.

## 2023-05-24 NOTE — Progress Notes (Signed)
    Procedures performed today:    None.  Independent interpretation of notes and tests performed by another provider:   None.  Brief History, Exam, Impression, and Recommendations:    Hamstring tear, bilateral, complete on the right, partial on the left This is a very pleasant 58 year old female, she has chronic bilateral posterior hip pain, ultimately MRI did show complete tearing of the right hamstring from the ischial tuberosity, partial tearing on the left, we got a consult with Dr. Dion Saucier, he and I both agree that surgery was minimally efficacious and that this would heal on its own. Dr. Dion Saucier has suggested 6 months of conservative treatment and I agree. She then had another slip and fall in the ice, with increasing pain. She is able to ambulate, but has some discomfort in the buttock when rising from a seated position. I did suggest tramadol for pain relief in the meantime as well as bilateral thigh compression sleeves She has not done either, we had a long discussion, she will start the tramadol, she will do her thigh compression sleeves and we can follow this up more in an as-needed basis per She does have an appointment coming up with Dr. Dion Saucier.    ____________________________________________ [name]. Benjamin Stain, M.D., ABFM., CAQSM., AME. Primary Care and Sports Medicine [facility]  Adjunct Professor of Family Medicine  [employer]  Restaurant manager, fast food

## 2023-05-25 ENCOUNTER — Encounter: Payer: Self-pay | Admitting: Family Medicine

## 2023-05-25 ENCOUNTER — Ambulatory Visit (INDEPENDENT_AMBULATORY_CARE_PROVIDER_SITE_OTHER): Payer: 59 | Admitting: Family Medicine

## 2023-05-25 VITALS — BP 133/87 | HR 83 | Ht 60.0 in | Wt 164.0 lb

## 2023-05-25 DIAGNOSIS — E1169 Type 2 diabetes mellitus with other specified complication: Secondary | ICD-10-CM

## 2023-05-25 DIAGNOSIS — E785 Hyperlipidemia, unspecified: Secondary | ICD-10-CM | POA: Diagnosis not present

## 2023-05-25 DIAGNOSIS — E119 Type 2 diabetes mellitus without complications: Secondary | ICD-10-CM | POA: Diagnosis not present

## 2023-05-25 DIAGNOSIS — Z0000 Encounter for general adult medical examination without abnormal findings: Secondary | ICD-10-CM

## 2023-05-25 DIAGNOSIS — Z23 Encounter for immunization: Secondary | ICD-10-CM

## 2023-05-25 NOTE — Patient Instructions (Signed)
 Preventive Care 16-60 Years Old, Female  Preventive care refers to lifestyle choices and visits with your health care provider that can promote health and wellness. Preventive care visits are also called wellness exams.  What can I expect for my preventive care visit?  Counseling  Your health care provider may ask you questions about your:  Medical history, including:  Past medical problems.  Family medical history.  Pregnancy history.  Current health, including:  Menstrual cycle.  Method of birth control.  Emotional well-being.  Home life and relationship well-being.  Sexual activity and sexual health.  Lifestyle, including:  Alcohol, nicotine or tobacco, and drug use.  Access to firearms.  Diet, exercise, and sleep habits.  Work and work Astronomer.  Sunscreen use.  Safety issues such as seatbelt and bike helmet use.  Physical exam  Your health care provider will check your:  Height and weight. These may be used to calculate your BMI (body mass index). BMI is a measurement that tells if you are at a healthy weight.  Waist circumference. This measures the distance around your waistline. This measurement also tells if you are at a healthy weight and may help predict your risk of certain diseases, such as type 2 diabetes and high blood pressure.  Heart rate and blood pressure.  Body temperature.  Skin for abnormal spots.  What immunizations do I need?    Vaccines are usually given at various ages, according to a schedule. Your health care provider will recommend vaccines for you based on your age, medical history, and lifestyle or other factors, such as travel or where you work.  What tests do I need?  Screening  Your health care provider may recommend screening tests for certain conditions. This may include:  Lipid and cholesterol levels.  Diabetes screening. This is done by checking your blood sugar (glucose) after you have not eaten for a while (fasting).  Pelvic exam and Pap test.  Hepatitis B test.  Hepatitis C  test.  HIV (human immunodeficiency virus) test.  STI (sexually transmitted infection) testing, if you are at risk.  Lung cancer screening.  Colorectal cancer screening.  Mammogram. Talk with your health care provider about when you should start having regular mammograms. This may depend on whether you have a family history of breast cancer.  BRCA-related cancer screening. This may be done if you have a family history of breast, ovarian, tubal, or peritoneal cancers.  Bone density scan. This is done to screen for osteoporosis.  Talk with your health care provider about your test results, treatment options, and if necessary, the need for more tests.  Follow these instructions at home:  Eating and drinking    Eat a diet that includes fresh fruits and vegetables, whole grains, lean protein, and low-fat dairy products.  Take vitamin and mineral supplements as recommended by your health care provider.  Do not drink alcohol if:  Your health care provider tells you not to drink.  You are pregnant, may be pregnant, or are planning to become pregnant.  If you drink alcohol:  Limit how much you have to 0-1 drink a day.  Know how much alcohol is in your drink. In the [place] one drink equals one 12 oz bottle of beer (355 mL), one 5 oz glass of wine (148 mL), or one 1 oz glass of hard liquor (44 mL).  Lifestyle  Brush your teeth every morning and night with fluoride toothpaste. Floss one time each day.  Exercise for at least  30 minutes 5 or more days each week.  Do not use any products that contain nicotine or tobacco. These products include cigarettes, chewing tobacco, and vaping devices, such as e-cigarettes. If you need help quitting, ask your health care provider.  Do not use drugs.  If you are sexually active, practice safe sex. Use a condom or other form of protection to prevent STIs.  If you do not wish to become pregnant, use a form of birth control. If you plan to become pregnant, see your health care provider for a  prepregnancy visit.  Take aspirin only as told by your health care provider. Make sure that you understand how much to take and what form to take. Work with your health care provider to find out whether it is safe and beneficial for you to take aspirin daily.  Find healthy ways to manage stress, such as:  Meditation, yoga, or listening to music.  Journaling.  Talking to a trusted person.  Spending time with friends and family.  Minimize exposure to UV radiation to reduce your risk of skin cancer.  Safety  Always wear your seat belt while driving or riding in a vehicle.  Do not drive:  If you have been drinking alcohol. Do not ride with someone who has been drinking.  When you are tired or distracted.  While texting.  If you have been using any mind-altering substances or drugs.  Wear a helmet and other protective equipment during sports activities.  If you have firearms in your house, make sure you follow all gun safety procedures.  Seek help if you have been physically or sexually abused.  What's next?  Visit your health care provider once a year for an annual wellness visit.  Ask your health care provider how often you should have your eyes and teeth checked.  Stay up to date on all vaccines.  This information is not intended to replace advice given to you by your health care provider. Make sure you discuss any questions you have with your health care provider.  Document Revised: 09/16/2020 Document Reviewed: 09/16/2020  Elsevier Patient Education  2024 [employer].

## 2023-05-25 NOTE — Progress Notes (Signed)
 Patricia Arroyo - 58 y.o. female MRN [id number]  Date of birth: 02-10-1966  Subjective Chief Complaint  Patient presents with   Annual Exam    HPI Patricia Arroyo is a 58 y.o. female here today for annual exam.   She reports that overall she is doing well.  Having some increase back pain and planning on seeing PT for hamstring injury.   We reviewed her labs today.  Her A1c has increased.  She admits that she could do better with diet.  Her cholesterol is up some as well.  She has started dietary changes already.   She is somewhat active.    Non-smoker.  Rare EtOH.   Review of Systems  Constitutional:  Negative for chills, fever, malaise/fatigue and weight loss.  HENT:  Negative for congestion, ear pain and sore throat.   Eyes:  Negative for blurred vision, double vision and pain.  Respiratory:  Negative for cough and shortness of breath.   Cardiovascular:  Negative for chest pain and palpitations.  Gastrointestinal:  Negative for abdominal pain, blood in stool, constipation, heartburn and nausea.  Genitourinary:  Negative for dysuria and urgency.  Musculoskeletal:  Negative for joint pain and myalgias.  Neurological:  Negative for dizziness and headaches.  Endo/Heme/Allergies:  Does not bruise/bleed easily.  Psychiatric/Behavioral:  Negative for depression. The patient is not nervous/anxious and does not have insomnia.     Allergies  Allergen Reactions   Adhesive [Tape] Hives and Other (See Comments)    Pt states that she is allergic to adhesive on Band-Aids.   Banana     Burning sensation on tongue   Diflucan [Fluconazole] Hives   Other Other (See Comments) and Hives    Pt states that she is allergic to adhesive on Band-Aids.   Penicillins Rash   Sulfa Antibiotics Rash    Past Medical History:  Diagnosis Date   ADHD (attention deficit hyperactivity disorder)    Anxiety    Carpal tunnel syndrome    Depression    h/o---no meds now   Diabetes mellitus without complication  (HCC)    GERD (gastroesophageal reflux disease)    Hypertension    IBS (irritable bowel syndrome)    Varicose veins     Past Surgical History:  Procedure Laterality Date   ABDOMINOPLASTY  2020   ANTERIOR AND POSTERIOR REPAIR  03/10/2011   Procedure: ANTERIOR (CYSTOCELE) AND POSTERIOR REPAIR (RECTOCELE);  Surgeon: Turner Daniels, MD;  Location: [facility];  Service: Gynecology;  Laterality: N/A;  with Sacrospinous Ligament Suspension   BREAST REDUCTION SURGERY Bilateral 2020   CARPAL TUNNEL RELEASE Bilateral 04/01/2013   Procedure: BILATERAL CARPAL TUNNEL RELEASE;  Surgeon: Tami Ribas, MD;  Location: [facility];  Service: Orthopedics;  Laterality: Bilateral;   COLONOSCOPY     DILATION AND CURETTAGE OF UTERUS     FOOT FASCIOTOMY  2005   both feet   LAPAROSCOPIC ASSISTED VAGINAL HYSTERECTOMY  03/10/2011   Procedure: LAPAROSCOPIC ASSISTED VAGINAL HYSTERECTOMY;  Surgeon: Turner Daniels, MD;  Location: [facility];  Service: Gynecology;  Laterality: N/A;   NOVASURE ABLATION     SALPINGOOPHORECTOMY  03/10/2011   Procedure: SALPINGO OOPHERECTOMY;  Surgeon: Turner Daniels, MD;  Location: [facility];  Service: Gynecology;  Laterality: Bilateral;   WISDOM TOOTH EXTRACTION      Social History   Socioeconomic History   Marital status: Married    Spouse name: Not on file   Number of children: Not on file  Years of education: Not on file   Highest education level: 12th grade  Occupational History   Not on file  Tobacco Use   Smoking status: Never   Smokeless tobacco: Never  Vaping Use   Vaping status: Never Used  Substance and Sexual Activity   Alcohol use: No    Alcohol/week: 0.0 standard drinks of alcohol    Comment: occasionally   Drug use: No   Sexual activity: Yes    Partners: Male    Birth control/protection: Post-menopausal  Other Topics Concern   Not on file  Social History Narrative   Not on file   Social Drivers of Health   Financial Resource Strain: Low Risk   (02/21/2023)   Overall Financial Resource Strain (CARDIA)    Difficulty of Paying Living Expenses: Not hard at all  Food Insecurity: No Food Insecurity (02/21/2023)   Hunger Vital Sign    Worried About Running Out of Food in the Last Year: Never true    Ran Out of Food in the Last Year: Never true  Transportation Needs: No Transportation Needs (02/21/2023)   PRAPARE - Administrator, Civil Service (Medical): No    Lack of Transportation (Non-Medical): No  Physical Activity: Unknown (02/21/2023)   Exercise Vital Sign    Days of Exercise per Week: 0 days    Minutes of Exercise per Session: Not on file  Stress: No Stress Concern Present (02/21/2023)   [employer] of Occupational Health - Occupational Stress Questionnaire    Feeling of Stress : Not at all  Social Connections: Socially Integrated (02/21/2023)   Social Connection and Isolation Panel [NHANES]    Frequency of Communication with Friends and Family: More than three times a week    Frequency of Social Gatherings with Friends and Family: Once a week    Attends Religious Services: More than 4 times per year    Active Member of Clubs or Organizations: Yes    Attends Engineer, structural: More than 4 times per year    Marital Status: Married    Family History  Problem Relation Age of Onset   Cancer Mother    Hypertension Mother    Stroke Mother    Ovarian cancer Mother    Diabetes Father    Heart attack Father    Stroke Father    Diabetes Sister    Ovarian cancer Sister     Health Maintenance  Topic Date Due   Zoster Vaccines- Shingrix (1 of 2) Never done   Pneumococcal Vaccine 18-36 Years old (2 of 2 - PCV) 05/24/2016   MAMMOGRAM  03/04/2022   COVID-19 Vaccine (9 - 2024-25 season) 12/29/2022   OPHTHALMOLOGY EXAM  08/04/2023 (Originally 08/02/2020)   HEMOGLOBIN A1C  11/16/2023   Colonoscopy  01/17/2024   FOOT EXAM  02/29/2024   Diabetic kidney evaluation - eGFR measurement  05/18/2024    Diabetic kidney evaluation - Urine ACR  05/18/2024   DTaP/Tdap/Td (2 - Td or Tdap) 10/05/2027   INFLUENZA VACCINE  Completed   Hepatitis C Screening  Completed   HIV Screening  Completed   HPV VACCINES  Aged Out     ----------------------------------------------------------------------------------------------------------------------------------------------------------------------------------------------------------------- Physical Exam BP 133/87 (BP Location: Left Arm, Patient Position: Sitting, Cuff Size: Normal)   Pulse 83   Ht 5' (1.524 m)   Wt 164 lb (74.4 kg)   SpO2 97%   BMI 32.03 kg/m   Physical Exam Constitutional:      General: She is not in  acute distress. HENT:     Head: Normocephalic and atraumatic.     Right Ear: Tympanic membrane and ear canal normal.     Left Ear: Tympanic membrane and ear canal normal.     Nose: Nose normal.  Eyes:     General: No scleral icterus.    Conjunctiva/sclera: Conjunctivae normal.  Neck:     Thyroid: No thyromegaly.  Cardiovascular:     Rate and Rhythm: Normal rate and regular rhythm.     Heart sounds: Normal heart sounds.  Pulmonary:     Effort: Pulmonary effort is normal.     Breath sounds: Normal breath sounds.  Abdominal:     General: Bowel sounds are normal. There is no distension.     Palpations: Abdomen is soft.     Tenderness: There is no abdominal tenderness. There is no guarding.  Musculoskeletal:        General: Normal range of motion.     Cervical back: Normal range of motion and neck supple.  Lymphadenopathy:     Cervical: No cervical adenopathy.  Skin:    General: Skin is warm and dry.     Findings: No rash.  Neurological:     General: No focal deficit present.     Mental Status: She is alert and oriented to person, place, and time.     Cranial Nerves: No cranial nerve deficit.     Coordination: Coordination normal.  Psychiatric:        Mood and Affect: Mood normal.        Behavior: Behavior normal.      ------------------------------------------------------------------------------------------------------------------------------------------------------------------------------------------------------------------- Assessment and Plan  Well adult exam Well adult Labs reviewed with her today. Immunizations: Up-to-date Anticipatory guidance/risk factor reduction: Recommendations per AVS.   No orders of the defined types were placed in this encounter.   No follow-ups on file.    This visit occurred during the SARS-CoV-2 public health emergency.  Safety protocols were in place, including screening questions prior to the visit, additional usage of staff PPE, and extensive cleaning of exam room while observing appropriate contact time as indicated for disinfecting solutions.

## 2023-05-25 NOTE — Assessment & Plan Note (Signed)
Well adult Labs reviewed with her today. Immunizations: Up-to-date Anticipatory guidance/risk factor reduction: Recommendations per AVS.

## 2023-05-29 ENCOUNTER — Encounter: Payer: Self-pay | Admitting: Surgery

## 2023-05-29 ENCOUNTER — Ambulatory Visit: Payer: 59 | Admitting: Surgery

## 2023-05-29 VITALS — BP 133/83 | HR 76 | Temp 97.9°F | Resp 20 | Ht 60.0 in | Wt 161.0 lb

## 2023-05-29 DIAGNOSIS — I83812 Varicose veins of left lower extremities with pain: Secondary | ICD-10-CM

## 2023-05-29 DIAGNOSIS — I8393 Asymptomatic varicose veins of bilateral lower extremities: Secondary | ICD-10-CM

## 2023-05-29 NOTE — Progress Notes (Signed)
 [facility]  Patient name: Patricia Arroyo MRN: [id number] DOB: 25-Jul-1965 Sex: female   REASON FOR VISIT:    Follow up  HISOTRY OF PRESENT ILLNESS:    Patricia Arroyo is a 58 y.o. female who is status post bilateral saphenous vein stripping and stab phlebectomy in 2016 with Dr. Hart Rochester.  She stopped wearing compression stockings roughly 5 years ago and since that time she has been complaining of worsening lower extremity varicose veins that have become painful particular at the end of the day.  She has not had any bleeding episodes but does have burning and throbbing.  She denies any history of DVT.  She saw Dr. Karin Lieu 3 months ago and he put her back into 20-30 thigh-high compression stocks.  These do help her.  She is on her feet all day at work and wear steel toed shoes.  The varicose veins continue to be more prominent at the end of the day and very painful.  She also has swelling at the end of the day   PAST MEDICAL HISTORY:   Past Medical History:  Diagnosis Date   ADHD (attention deficit hyperactivity disorder)    Anxiety    Carpal tunnel syndrome    Depression    h/o---no meds now   Diabetes mellitus without complication (HCC)    GERD (gastroesophageal reflux disease)    Hypertension    IBS (irritable bowel syndrome)    Varicose veins      FAMILY HISTORY:   Family History  Problem Relation Age of Onset   Cancer Mother    Hypertension Mother    Stroke Mother    Ovarian cancer Mother    Diabetes Father    Heart attack Father    Stroke Father    Diabetes Sister    Ovarian cancer Sister     SOCIAL HISTORY:   Social History   Tobacco Use   Smoking status: Never   Smokeless tobacco: Never  Substance Use Topics   Alcohol use: No    Alcohol/week: 0.0 standard drinks of alcohol    Comment: occasionally     ALLERGIES:   Allergies  Allergen Reactions   Adhesive [Tape] Hives and Other (See  Comments)    Pt states that she is allergic to adhesive on Band-Aids.   Banana     Burning sensation on tongue   Diflucan [Fluconazole] Hives   Other Other (See Comments) and Hives    Pt states that she is allergic to adhesive on Band-Aids.   Penicillins Rash   Sulfa Antibiotics Rash     CURRENT MEDICATIONS:   Current Outpatient Medications  Medication Sig Dispense Refill   amphetamine-dextroamphetamine (ADDERALL XR) 15 MG 24 hr capsule Take 30 mg by mouth.      buPROPion (WELLBUTRIN XL) 300 MG 24 hr tablet bupropion HCl XL 300 mg 24 hr tablet, extended release     conjugated estrogens (PREMARIN) vaginal cream Premarin 0.625 mg/gram vaginal cream  apply to vaginal and vulva twice weekly     cyclobenzaprine (FLEXERIL) 5 MG tablet Take 0.5-2 tablets (2.5-10 mg total) by mouth 3 (three) times daily as needed for muscle spasms. 180 tablet 11   diazepam (VALIUM) 10 MG tablet Take 10 mg by mouth daily as needed for anxiety.     diclofenac Sodium (VOLTAREN) 1 % GEL Apply 4 g topically 4 (four) times daily. To affected joint. 350 g 11   Elastic Bandages & Supports (MEDICAL COMPRESSION STOCKINGS)  MISC 1 Units by Does not apply route daily. Thigh high 20-27mmhg 2 each 4   estradiol (ESTRACE) 1 MG tablet Take 1 mg by mouth daily.     etodolac (LODINE XL) 500 MG 24 hr tablet Take 1 tablet (500 mg total) by mouth 2 (two) times daily with a meal. 180 tablet 3   lisinopril (ZESTRIL) 10 MG tablet TAKE 1 TABLET (10 MG TOTAL) BY MOUTH DAILY. 90 tablet 3   omeprazole (PRILOSEC) 40 MG capsule TAKE 1 CAPSULE BY MOUTH EVERY DAY 90 capsule 3   triamterene-hydrochlorothiazide (MAXZIDE-25) 37.5-25 MG tablet Take 1 tablet by mouth daily. 90 tablet 3   No current facility-administered medications for this visit.    REVIEW OF SYSTEMS:   [X]  denotes positive finding, [ ]  denotes negative finding Cardiac  Comments:  Chest pain or chest pressure:    Shortness of breath upon exertion:    Short of breath when  lying flat:    Irregular heart rhythm:        Vascular    Pain in calf, thigh, or hip brought on by ambulation:    Pain in feet at night that wakes you up from your sleep:     Blood clot in your veins:    Leg swelling:  x       Pulmonary    Oxygen at home:    Productive cough:     Wheezing:         Neurologic    Sudden weakness in arms or legs:     Sudden numbness in arms or legs:     Sudden onset of difficulty speaking or slurred speech:    Temporary loss of vision in one eye:     Problems with dizziness:         Gastrointestinal    Blood in stool:     Vomited blood:         Genitourinary    Burning when urinating:     Blood in urine:        Psychiatric    Major depression:         Hematologic    Bleeding problems:    Problems with blood clotting too easily:        Skin    Rashes or ulcers:        Constitutional    Fever or chills:      PHYSICAL EXAM:   There were no vitals filed for this visit.  GENERAL: The patient is a well-nourished female, in no acute distress. The vital signs are documented above. CARDIAC: There is a regular rate and rhythm.  VASCULAR: SonoSite was used to evaluate the saphenous vein in the left leg.  There is a length of vein approximately 15 cm long near the saphenofemoral junction that has recanalized.  I did not see an adequate small saphenous vein.  She has multiple varicosities on the left leg including on the posterior calf PULMONARY: Non-labored respirations MUSCULOSKELETAL: There are no major deformities or cyanosis. NEUROLOGIC: No focal weakness or paresthesias are detected. SKIN: See photo below. PSYCHIATRIC: The patient has a normal affect.     STUDIES:   I have reviewed her reflux study with the following findings: LEFT         Reflux NoReflux  Reflux  Diameter cmsComments  Yes     Time                                          +-------------+---------+------+----------+------------+-------------------  ----+  CFV                   yes  >1 second                                       +-------------+---------+------+----------+------------+-------------------  ----+  GSV at SFJ             yes   >500 ms      .652                              +-------------+---------+------+----------+------------+-------------------  ----+  GSV prox               yes   >500 ms      .645                              thigh                                                                       +-------------+---------+------+----------+------------+-------------------  ----+  GSV mid thigh                                     prior                                                                       ablation/stripping        +-------------+---------+------+----------+------------+-------------------  ----+  GSV dist                                          prior                     thigh                                             ablation/stripping        +-------------+---------+------+----------+------------+-------------------  ----+  GSV at knee                                       prior  ablation/stripping        +-------------+---------+------+----------+------------+-------------------  ----+  GSV prox calfno                           .114                              +-------------+---------+------+----------+------------+-------------------  ----+  SSV Pop Fossano                           .246                              +-------------+---------+------+----------+------------+-------------------  ----+  SSV prox calfno                           .169                               +-------------+---------+------+----------+------------+-------------------  ----+   MEDICAL ISSUES:   CEAP class III, left leg: The patient has had recurrence of her symptoms.  She has approximately a 15 cm segment of her proximal saphenous vein up to the saphenofemoral junction that is recanalized and has reflux.  She has also developed prominent varicosities in the medial thigh and posterior calf which are very painful.  She has swelling at the end of the day.  She does get relief with compression socks.  I think the neck step is to proceed with laser ablation of the proximal great saphenous vein and then greater than 20 stabs to treat her symptoms.  We will work on Therapist, occupational and contact her in the near future to get her scheduled for her procedure.      Charlena Cross, MD, FACS [facility] [place] [phone] Pager [phone]

## 2023-05-30 ENCOUNTER — Ambulatory Visit: Payer: 59 | Admitting: Sports Medicine

## 2023-06-01 ENCOUNTER — Encounter: Payer: Self-pay | Admitting: Physical Therapy

## 2023-06-01 ENCOUNTER — Ambulatory Visit: Payer: 59 | Attending: Sports Medicine | Admitting: Physical Therapy

## 2023-06-01 ENCOUNTER — Other Ambulatory Visit: Payer: Self-pay

## 2023-06-01 DIAGNOSIS — R252 Cramp and spasm: Secondary | ICD-10-CM | POA: Diagnosis present

## 2023-06-01 DIAGNOSIS — M79604 Pain in right leg: Secondary | ICD-10-CM | POA: Insufficient documentation

## 2023-06-01 DIAGNOSIS — M6281 Muscle weakness (generalized): Secondary | ICD-10-CM | POA: Diagnosis present

## 2023-06-01 DIAGNOSIS — M79605 Pain in left leg: Secondary | ICD-10-CM | POA: Insufficient documentation

## 2023-06-01 DIAGNOSIS — S76311A Strain of muscle, fascia and tendon of the posterior muscle group at thigh level, right thigh, initial encounter: Secondary | ICD-10-CM | POA: Diagnosis not present

## 2023-06-01 NOTE — Patient Instructions (Signed)

## 2023-06-01 NOTE — Therapy (Signed)
 OUTPATIENT PHYSICAL THERAPY LOWER EXTREMITY EVALUATION   Patient Name: Patricia Arroyo MRN: [id number] DOB:02-12-1966, 58 y.o., female Today's Date: 06/01/2023  END OF SESSION:  PT End of Session - 06/01/23 0855     Visit Number 1    Date for PT Re-Evaluation 07/13/23    Authorization Type [employer]    PT Start Time 0848    PT Stop Time 0938    PT Time Calculation (min) 50 min    Activity Tolerance Patient tolerated treatment well    Behavior During Therapy WFL for tasks assessed/performed              Past Medical History:  Diagnosis Date   ADHD (attention deficit hyperactivity disorder)    Anxiety    Carpal tunnel syndrome    Depression    h/o---no meds now   Diabetes mellitus without complication (HCC)    GERD (gastroesophageal reflux disease)    Hypertension    IBS (irritable bowel syndrome)    Varicose veins    Past Surgical History:  Procedure Laterality Date   ABDOMINOPLASTY  2020   ANTERIOR AND POSTERIOR REPAIR  03/10/2011   Procedure: ANTERIOR (CYSTOCELE) AND POSTERIOR REPAIR (RECTOCELE);  Surgeon: Turner Daniels, MD;  Location: [facility];  Service: Gynecology;  Laterality: N/A;  with Sacrospinous Ligament Suspension   BREAST REDUCTION SURGERY Bilateral 2020   CARPAL TUNNEL RELEASE Bilateral 04/01/2013   Procedure: BILATERAL CARPAL TUNNEL RELEASE;  Surgeon: Tami Ribas, MD;  Location: [facility];  Service: Orthopedics;  Laterality: Bilateral;   COLONOSCOPY     DILATION AND CURETTAGE OF UTERUS     FOOT FASCIOTOMY  2005   both feet   LAPAROSCOPIC ASSISTED VAGINAL HYSTERECTOMY  03/10/2011   Procedure: LAPAROSCOPIC ASSISTED VAGINAL HYSTERECTOMY;  Surgeon: Turner Daniels, MD;  Location: [facility];  Service: Gynecology;  Laterality: N/A;   NOVASURE ABLATION     SALPINGOOPHORECTOMY  03/10/2011   Procedure: SALPINGO OOPHERECTOMY;  Surgeon: Turner Daniels, MD;  Location: [facility];  Service: Gynecology;  Laterality: Bilateral;   WISDOM TOOTH EXTRACTION     Patient  Active Problem List   Diagnosis Date Noted   Hamstring tear, bilateral, complete on the right, partial on the left 03/09/2023   Cervical spondylosis 09/02/2022   Lumbosacral spondylosis 11/24/2021   Tick bite 11/14/2021   Anterior tibialis tendinitis, left 10/19/2021   Osteoarthritis of both wrists and hands 09/01/2021   Acute meniscal tear of right knee 03/22/2021   Injury of second toe of right foot 08/24/2020   Aortic atherosclerosis (HCC) 05/15/2020   Ureteric stone 04/23/2020   Gross hematuria 04/23/2020   Lower abdominal pain 03/17/2020   Attention deficit hyperactivity disorder, predominantly inattentive type 11/13/2019   Arthritis 11/13/2019   Anxiety 11/13/2019   Left wrist pain 01/10/2019   Disorder of rotator cuff, left 08/17/2017   Well adult exam 05/20/2016   Ganglion cyst of wrist 09/03/2015   Primary osteoarthritis of left knee 05/27/2015   Prepatellar bursitis, left knee 02/06/2015   Nocturnal leg cramps 02/06/2015   Diabetes type 2, controlled (HCC) 01/13/2015   Fatty liver disease, nonalcoholic 01/13/2015   Essential hypertension 04/20/2014   IBS (irritable bowel syndrome) 04/20/2014   GERD (gastroesophageal reflux disease) 04/20/2014   Varicose veins without complication 02/03/2014    PCP: Everrett Coombe, DO   REFERRING PROVIDER: Monica Becton, MD  REFERRING DIAG: [phone] (ICD-10-CM) - Hamstring injury, right, sequela   THERAPY DIAG:  Pain in right leg  Pain  in left leg  Muscle weakness (generalized)  Cramp and spasm  Rationale for Evaluation and Treatment: Rehabilitation  ONSET DATE: 02/20/2023 hurt hamstring  SUBJECTIVE:   SUBJECTIVE STATEMENT: Pretty sure I tore my R hamstring again, I fell on the ice getting into the car.   She had been to PT back in December 2024 and it was healing and feeling better.  Saw surgeon and was told it would heal itself in 6-7 months, it was torn in half but rarely surgically repaired.  It was healing  when she fell on the ice and reinjured.  Other side is torn too, but not as bad. Can't sleep, using heat, told to avoid squatting, trying to avoid pain medication.   Back in 02/21/23 "Recent misstep, she felt a pull in the right buttock, now she has pain at the ischial tuberosity.  On exam she does have weakness to resisted knee flexion on the right.  Suspect hamstring strain, we will get some x-rays to ensure no ischial tuberosity avulsion fracture, she will also do some physical therapy."  PERTINENT HISTORY: ADHD, HTN, T2DM (well controlled), NASH, IBS, GERD, OA L knee, R knee meniscal tear (repaired beginning of year), L prepatellar bursitis. PAIN:  Are you having pain? Yes: NPRS scale: 8/10 Pain location: R hamstring  Pain description: throbbing, sharp Aggravating factors: hard surfaces, walking too fast, standing up Relieving factors: heat  Are you having pain? Yes: NPRS scale: 6/10 Pain location: L hamstring  Pain description: pain Aggravating factors: hard surfaces, walking too fast Relieving factors: walking slow   PRECAUTIONS: None  RED FLAGS: None   WEIGHT BEARING RESTRICTIONS: No  FALLS:  Has patient fallen in last 6 months? Yes. Number of falls 1 tripped, works around a lot of trip hazards at work  LIVING ENVIRONMENT: Lives with: lives with their spouse Lives in: House/apartment Stairs: Yes: Internal: 2 steps; none Has following equipment at home: None  OCCUPATION: Psychiatric nurse, on feet on concrete long shifts  PLOF: Independent  PATIENT GOALS: feel better and strengthening my legs.   NEXT MD VISIT: 03/13/23 with referring provider  OBJECTIVE:  Note: Objective measures were completed at Evaluation unless otherwise noted.  DIAGNOSTIC FINDINGS:  03/12/24 MR Hip IMPRESSION: 1. Near complete tear of the right hamstrings from the ischial tuberosity with 1-2 cm of tendon retraction. 2. Mild-to-moderate right hip osteoarthritis with degenerative tearing of  the anterior and superior labrum. 3. Left hamstring tendinosis without tear.  PATIENT SURVEYS:  LEFS 23/80= 29% ability   COGNITION: Overall cognitive status: Within functional limits for tasks assessed     SENSATION: WFL  MUSCLE LENGTH: Hamstrings: Right 50 deg through popliteal angle; Left 40 deg through popliteal angle   POSTURE: rounded shoulders and forward head  PALPATION: Extreme tenderness bil at ischial tuberosities,   LOWER EXTREMITY MMT:  MMT Right eval Left eval  Hip flexion 4+p!  5  Hip extension    Hip abduction 5p! (seated) 5 (seated)  Hip adduction 4+ (Seated) 4+ (Seated)  Knee flexion 4+p! (HS) 4+p! (HS)  Knee extension 5p! 5p!   (Blank rows = not tested)  LOWER EXTREMITY ROM:  WNL in hips  FUNCTIONAL TESTS:  5x STS = 18.6 seconds  GAIT: Distance walked: 55' Assistive device utilized: None Level of assistance: Complete Independence Comments: no significant deviation   TODAY'S TREATMENT:  DATE:    06/01/23 EVAL Ultrasound: x 6 min each side (12 min total ) to bil proximal hamstrings 1 MHz, 1.2 w/cm2 cont to decrease inflammation/pain Manual Therapy: to decrease muscle spasm and pain and improve mobility STM/TPR to bil glutes/piriformis, skilled palpation and monitoring during dry needling. Trigger Point Dry Needling  Subsequent Treatment: Instructions provided previously at initial dry needling treatment.  Instructions reviewed, if requested by the patient, prior to subsequent dry needling treatment.   Patient Verbal Consent Given: Yes Education Handout Provided: Previously Provided Muscles Treated: bil glut max, bil piriformis Electrical Stimulation Performed: No Treatment Response/Outcome: Twitch Response Elicited and Palpable Increase in Muscle Length   PATIENT EDUCATION:  Education details: findings, POC,  review TrDN Person educated: Patient Education method: Medical illustrator Education comprehension: verbalized understanding  HOME EXERCISE PROGRAM: TBD  ASSESSMENT:  CLINICAL IMPRESSION: Patient is a 58 y.o. female who was seen today for physical therapy evaluation and treatment for R hamstring tear.   She was seen previously for same concern, but had recent aggravation of pain due to fall on ice getting into car.  She also has pain and tearing in L hamstring.   On examination she demonstrates tenderness and tightness in bil hamstrings, tenderness bil ischial tuberosities, and pain with MMT with activation of hip mm.  Especially hamstrings and adductors.  Since she reported good benefit from [place] and TrDN previously, applied [place] to proximal HS bilaterally to improve blood flow to encourage healing, and then TrDN to bil glutes/piriformis to decrease muscle tension as she was very tender and had palpable trigger points in both glutes.  Reported immediate improvement in pain and improved mobility with ability to bend and don shoes following interventions.   LETISHIA ELLIOTT would benefit from skilled physical therapy to address impairments, decrease pain and improve quality of life.    OBJECTIVE IMPAIRMENTS: decreased activity tolerance, decreased mobility, decreased ROM, decreased strength, increased muscle spasms, impaired flexibility, and pain.   ACTIVITY LIMITATIONS: lifting, bending, sitting, standing, squatting, sleeping, stairs, bed mobility, and locomotion level  PARTICIPATION LIMITATIONS: meal prep, cleaning, laundry, driving, shopping, community activity, and occupation  PERSONAL FACTORS: Past/current experiences, Time since onset of injury/illness/exacerbation, and 1-2 comorbidities: ADHD, HTN, T2DM (well controlled), NASH, IBS, GERD, OA L knee, L prepatellar bursitis.  are also affecting patient's functional outcome.   REHAB POTENTIAL: Good  CLINICAL DECISION MAKING:  Stable/uncomplicated  EVALUATION COMPLEXITY: Low   GOALS: Goals reviewed with patient? Yes  SHORT TERM GOALS: Target date: 06/15/2023  Patient will be independent with initial HEP. Baseline: needs Goal status: INITIAL  LONG TERM GOALS: Target date: 07/13/2023   Patient will be independent with advanced/ongoing HEP to improve outcomes and carryover.  Baseline:  Goal status: INITIAL  2.  Patient will report at least 75% improvement in R buttock pain to improve QOL. Baseline: 8/10 Goal status: INITIAL  3.  Patient will demonstrate improved functional LE strength as demonstrated by 5/5 LE strength without pain. Baseline: see objective Goal status: INITIAL  4.  Patient will report 9 points improvement on LEFS to demonstrate improved functional ability. Baseline: 23/80 Goal status: INITIAL  5.  Patient will be able to sit for 30 min without discomfort.  Baseline: had to lean and reposition constantly today due to pain Goal status: INITIAL   PLAN:  PT FREQUENCY: 1-2x/week  PT DURATION: 6 weeks  PLANNED INTERVENTIONS: 97110-Therapeutic exercises, 97530- Therapeutic activity, [id number]- Neuromuscular re-education, 97535- Self Care, 16109- Manual therapy, [id number]- Gait training, [id number]- Orthotic  Fit/training, 84132- Electrical stimulation (unattended), [id number]- Electrical stimulation (manual), [id number]- Ultrasound, [id number]- Traction (mechanical), [id number]- Ionotophoresis 4mg /ml Dexamethasone, Patient/Family education, Balance training, Stair training, Taping, Dry Needling, Joint mobilization, Joint manipulation, Spinal manipulation, Spinal mobilization, Cryotherapy, and Moist heat  PLAN FOR NEXT SESSION: HEP for gentle HS and quad stretches, continue modalities PRN.     Jena Gauss, PT, DPT 06/01/2023, 9:57 AM

## 2023-06-06 ENCOUNTER — Other Ambulatory Visit: Payer: Self-pay | Admitting: *Deleted

## 2023-06-06 DIAGNOSIS — I83812 Varicose veins of left lower extremities with pain: Secondary | ICD-10-CM

## 2023-06-07 ENCOUNTER — Encounter: Payer: Self-pay | Admitting: Physical Therapy

## 2023-06-13 ENCOUNTER — Encounter: Payer: Self-pay | Admitting: Physical Therapy

## 2023-06-13 ENCOUNTER — Ambulatory Visit: Payer: Self-pay | Attending: Family Medicine | Admitting: Physical Therapy

## 2023-06-13 DIAGNOSIS — M79604 Pain in right leg: Secondary | ICD-10-CM | POA: Diagnosis present

## 2023-06-13 DIAGNOSIS — M6281 Muscle weakness (generalized): Secondary | ICD-10-CM | POA: Insufficient documentation

## 2023-06-13 DIAGNOSIS — M79605 Pain in left leg: Secondary | ICD-10-CM | POA: Insufficient documentation

## 2023-06-13 DIAGNOSIS — R252 Cramp and spasm: Secondary | ICD-10-CM | POA: Insufficient documentation

## 2023-06-13 NOTE — Therapy (Signed)
 OUTPATIENT PHYSICAL THERAPY LOWER EXTREMITY EVALUATION   Patient Name: Patricia Arroyo MRN: [id number] DOB:[birth year], 58 y.o., female Today's Date: 06/13/2023  END OF SESSION:  PT End of Session - 06/13/23 0935     Visit Number 2    Date for PT Re-Evaluation 07/13/23    Authorization Type U[employer]    PT Start Time 0933    PT Stop Time 1032    PT Time Calculation (min) 59 min    Activity Tolerance Patient tolerated treatment well    Behavior During Therapy WFL for tasks assessed/performed              Past Medical History:  Diagnosis Date   ADHD (attention deficit hyperactivity disorder)    Anxiety    Carpal tunnel syndrome    Depression    h/o---no meds now   Diabetes mellitus without complication (HCC)    GERD (gastroesophageal reflux disease)    Hypertension    IBS (irritable bowel syndrome)    Varicose veins    Past Surgical History:  Procedure Laterality Date   ABDOMINOPLASTY  2020   ANTERIOR AND POSTERIOR REPAIR  03/10/2011   Procedure: ANTERIOR (CYSTOCELE) AND POSTERIOR REPAIR (RECTOCELE);  Surgeon: Turner Daniels, MD;  Location: [facility];  Service: Gynecology;  Laterality: N/A;  with Sacrospinous Ligament Suspension   BREAST REDUCTION SURGERY Bilateral 2020   CARPAL TUNNEL RELEASE Bilateral 04/01/2013   Procedure: BILATERAL CARPAL TUNNEL RELEASE;  Surgeon: Tami Ribas, MD;  Location: [facility];  Service: Orthopedics;  Laterality: Bilateral;   COLONOSCOPY     DILATION AND CURETTAGE OF UTERUS     FOOT FASCIOTOMY  2005   both feet   LAPAROSCOPIC ASSISTED VAGINAL HYSTERECTOMY  03/10/2011   Procedure: LAPAROSCOPIC ASSISTED VAGINAL HYSTERECTOMY;  Surgeon: Turner Daniels, MD;  Location: [facility];  Service: Gynecology;  Laterality: N/A;   NOVASURE ABLATION     SALPINGOOPHORECTOMY  03/10/2011   Procedure: SALPINGO OOPHERECTOMY;  Surgeon: Turner Daniels, MD;  Location: [facility];  Service: Gynecology;  Laterality: Bilateral;   WISDOM TOOTH EXTRACTION     Patient  Active Problem List   Diagnosis Date Noted   Hamstring tear, bilateral, complete on the right, partial on the left 03/09/2023   Cervical spondylosis 09/02/2022   Lumbosacral spondylosis 11/24/2021   Tick bite 11/14/2021   Anterior tibialis tendinitis, left 10/19/2021   Osteoarthritis of both wrists and hands 09/01/2021   Acute meniscal tear of right knee 03/22/2021   Injury of second toe of right foot 08/24/2020   Aortic atherosclerosis (HCC) 05/15/2020   Ureteric stone 04/23/2020   Gross hematuria 04/23/2020   Lower abdominal pain 03/17/2020   Attention deficit hyperactivity disorder, predominantly inattentive type 11/13/2019   Arthritis 11/13/2019   Anxiety 11/13/2019   Left wrist pain 01/10/2019   Disorder of rotator cuff, left 08/17/2017   Well adult exam 05/20/2016   Ganglion cyst of wrist 09/03/2015   Primary osteoarthritis of left knee 05/27/2015   Prepatellar bursitis, left knee 02/06/2015   Nocturnal leg cramps 02/06/2015   Diabetes type 2, controlled (HCC) 01/13/2015   Fatty liver disease, nonalcoholic 01/13/2015   Essential hypertension 04/20/2014   IBS (irritable bowel syndrome) 04/20/2014   GERD (gastroesophageal reflux disease) 04/20/2014   Varicose veins without complication 02/03/2014    PCP: Everrett Coombe, DO   REFERRING PROVIDER: Monica Becton, MD  REFERRING DIAG: [phone] (ICD-10-CM) - Hamstring injury, right, sequela   THERAPY DIAG:  Pain in right leg  Pain  in left leg  Muscle weakness (generalized)  Cramp and spasm  Rationale for Evaluation and Treatment: Rehabilitation  ONSET DATE: 02/20/2023 hurt hamstring  SUBJECTIVE:   SUBJECTIVE STATEMENT: 06/13/2023 felt good for 2 days after IE, but then the pain came back worse, almost went back to doctor.  Hates the flexeril, took it last night, feels hung over this morning, but it does help with sleep.  Goes on birding trip in [place] this week.    From WU:[id number] sure I tore my R  hamstring again, I fell on the ice getting into the car.   She had been to PT back in December 2024 and it was healing and feeling better.  Saw surgeon and was told it would heal itself in 6-7 months, it was torn in half but rarely surgically repaired.  It was healing when she fell on the ice and reinjured.  Other side is torn too, but not as bad. Can't sleep, using heat, told to avoid squatting, trying to avoid pain medication.   Back in 02/21/23 "Recent misstep, she felt a pull in the right buttock, now she has pain at the ischial tuberosity.  On exam she does have weakness to resisted knee flexion on the right.  Suspect hamstring strain, we will get some x-rays to ensure no ischial tuberosity avulsion fracture, she will also do some physical therapy."  PERTINENT HISTORY: ADHD, HTN, T2DM (well controlled), NASH, IBS, GERD, OA L knee, R knee meniscal tear (repaired beginning of year), L prepatellar bursitis. PAIN:  Are you having pain? Yes: NPRS scale: 8/10 Pain location: R hamstring  Pain description: throbbing, sharp Aggravating factors: hard surfaces, walking too fast, standing up Relieving factors: heat  Are you having pain? Yes: NPRS scale: 6/10 Pain location: L hamstring  Pain description: pain Aggravating factors: hard surfaces, walking too fast Relieving factors: walking slow   PRECAUTIONS: None  RED FLAGS: None   WEIGHT BEARING RESTRICTIONS: No  FALLS:  Has patient fallen in last 6 months? Yes. Number of falls 1 tripped, works around a lot of trip hazards at work  LIVING ENVIRONMENT: Lives with: lives with their spouse Lives in: House/apartment Stairs: Yes: Internal: 2 steps; none Has following equipment at home: None  OCCUPATION: Psychiatric nurse, on feet on concrete long shifts  PLOF: Independent  PATIENT GOALS: feel better and strengthening my legs.   NEXT MD VISIT: 03/13/23 with referring provider  OBJECTIVE:  Note: Objective measures were completed at  Evaluation unless otherwise noted.  DIAGNOSTIC FINDINGS:  03/12/24 MR Hip IMPRESSION: 1. Near complete tear of the right hamstrings from the ischial tuberosity with 1-2 cm of tendon retraction. 2. Mild-to-moderate right hip osteoarthritis with degenerative tearing of the anterior and superior labrum. 3. Left hamstring tendinosis without tear.  PATIENT SURVEYS:  LEFS 23/80= 29% ability   COGNITION: Overall cognitive status: Within functional limits for tasks assessed     SENSATION: WFL  MUSCLE LENGTH: Hamstrings: Right 50 deg through popliteal angle; Left 40 deg through popliteal angle   POSTURE: rounded shoulders and forward head  PALPATION: Extreme tenderness bil at ischial tuberosities,   LOWER EXTREMITY MMT:  MMT Right eval Left eval  Hip flexion 4+p!  5  Hip extension    Hip abduction 5p! (seated) 5 (seated)  Hip adduction 4+ (Seated) 4+ (Seated)  Knee flexion 4+p! (HS) 4+p! (HS)  Knee extension 5p! 5p!   (Blank rows = not tested)  LOWER EXTREMITY ROM:  WNL in hips  FUNCTIONAL TESTS:  5x STS =  18.6 seconds  GAIT: Distance walked: 56' Assistive device utilized: None Level of assistance: Complete Independence Comments: no significant deviation   TODAY'S TREATMENT:                                                                                                                              DATE:   06/13/23 Therapeutic Exercise: to improve strength and mobility.  Demo, verbal and tactile cues throughout for technique. Bike L1 x 5 min Seated hamstring stretch Seated piriformis stretches Standing runners stretches Standing ITBand stretch HEP update Ultrasound: x 6 min each side (12 min total ) to bil proximal hamstrings 1 MHz, 1.2 w/cm2 cont to decrease inflammation/pain Manual Therapy: to decrease muscle spasm and pain and improve mobility STM/TPR to bil glutes/piriformis, lumbar paraspinals, UPA mobs lumbar spine, skilled palpation and monitoring during  dry needling. Trigger Point Dry Needling  Subsequent Treatment: Instructions provided previously at initial dry needling treatment.  Instructions reviewed, if requested by the patient, prior to subsequent dry needling treatment.   Patient Verbal Consent Given: Yes Education Handout Provided: Previously Provided Muscles Treated: bil glut max, R piriformis, R L5/S1 multifidi Electrical Stimulation Performed: No Treatment Response/Outcome: Twitch Response Elicited and Palpable Increase in Muscle Length  06/01/23 EVAL Ultrasound: x 6 min each side (12 min total ) to bil proximal hamstrings 1 MHz, 1.2 w/cm2 cont to decrease inflammation/pain Manual Therapy: to decrease muscle spasm and pain and improve mobility STM/TPR to bil glutes/piriformis, skilled palpation and monitoring during dry needling. Trigger Point Dry Needling  Subsequent Treatment: Instructions provided previously at initial dry needling treatment.  Instructions reviewed, if requested by the patient, prior to subsequent dry needling treatment.   Patient Verbal Consent Given: Yes Education Handout Provided: Previously Provided Muscles Treated: bil glut max, bil piriformis Electrical Stimulation Performed: No Treatment Response/Outcome: Twitch Response Elicited and Palpable Increase in Muscle Length   PATIENT EDUCATION:  Education details: HEP Person educated: Patient Education method: Programmer, multimedia, Facilities manager, Verbal cues, and Handouts Education comprehension: verbalized understanding and returned demonstration  HOME EXERCISE PROGRAM: Access Code: [email or web address] URL: [email or web address] Date: 06/13/2023 Prepared by: Harrie Foreman  Exercises - Seated Piriformis Stretch with Trunk Bend  - 1 x daily - 7 x weekly - 1 sets - 3 reps - 15-30 sec hold - Seated Piriformis Stretch  - 1 x daily - 7 x weekly - 1 sets - 3 reps - 15-30 sec hold - Seated Hamstring Stretch  - 1 x daily - 7 x weekly - 1 sets - 3 reps  - 15-30 sec hold - Standing ITB Stretch  - 1 x daily - 7 x weekly - 1 sets - 3 reps - 15-30 sec hold - Gastroc Stretch on Wall  - 1 x daily - 7 x weekly - 1 sets - 3 reps - 15-30 sec hold  ASSESSMENT:  CLINICAL IMPRESSION: STEHANIE EKSTROM reported initial improvement with pain, but returned after 2 days.  She is going  on a trip and worried about long car ride, so reviewed some standing and sitting exercises that can do, but cautioned to perform gently in pain free ROM.  Continued with [place] to improved blood flow and healing, and manual therapy including TrDN to decrease tightness,pain in glutes.  Reported decreased pain after interventions.  HAWRAA STAMBAUGH continues to demonstrate potential for improvement and would benefit from continued skilled therapy to address impairments.     OBJECTIVE IMPAIRMENTS: decreased activity tolerance, decreased mobility, decreased ROM, decreased strength, increased muscle spasms, impaired flexibility, and pain.   ACTIVITY LIMITATIONS: lifting, bending, sitting, standing, squatting, sleeping, stairs, bed mobility, and locomotion level  PARTICIPATION LIMITATIONS: meal prep, cleaning, laundry, driving, shopping, community activity, and occupation  PERSONAL FACTORS: Past/current experiences, Time since onset of injury/illness/exacerbation, and 1-2 comorbidities: ADHD, HTN, T2DM (well controlled), NASH, IBS, GERD, OA L knee, L prepatellar bursitis.  are also affecting patient's functional outcome.   REHAB POTENTIAL: Good  CLINICAL DECISION MAKING: Stable/uncomplicated  EVALUATION COMPLEXITY: Low   GOALS: Goals reviewed with patient? Yes  SHORT TERM GOALS: Target date: 06/15/2023  Patient will be independent with initial HEP. Baseline: needs Goal status: IN PROGRESS given 06/13/23  LONG TERM GOALS: Target date: 07/13/2023   Patient will be independent with advanced/ongoing HEP to improve outcomes and carryover.  Baseline:  Goal status: IN PROGRESS  2.  Patient  will report at least 75% improvement in R buttock pain to improve QOL. Baseline: 8/10 Goal status: IN PROGRESS  3.  Patient will demonstrate improved functional LE strength as demonstrated by 5/5 LE strength without pain. Baseline: see objective Goal status: IN PROGRESS  4.  Patient will report 9 points improvement on LEFS to demonstrate improved functional ability. Baseline: 23/80 Goal status: IN PROGRESS  5.  Patient will be able to sit for 30 min without discomfort.  Baseline: had to lean and reposition constantly today due to pain Goal status: IN PROGRESS   PLAN:  PT FREQUENCY: 1-2x/week  PT DURATION: 6 weeks  PLANNED INTERVENTIONS: 97110-Therapeutic exercises, 97530- Therapeutic activity, 97112- Neuromuscular re-education, 97535- Self Care, 56213- Manual therapy, [id number]- Gait training, [phone]- Orthotic Fit/training, 97014- Electrical stimulation (unattended), [id number]- Electrical stimulation (manual), [id number]- Ultrasound, [id number]- Traction (mechanical), [id number]- Ionotophoresis 4mg /ml Dexamethasone, Patient/Family education, Balance training, Stair training, Taping, Dry Needling, Joint mobilization, Joint manipulation, Spinal manipulation, Spinal mobilization, Cryotherapy, and Moist heat  PLAN FOR NEXT SESSION: HEP for gentle HS and quad stretches, continue modalities PRN.     Jena Gauss, PT, DPT 06/13/2023, 10:48 AM

## 2023-06-15 ENCOUNTER — Telehealth: Payer: Self-pay

## 2023-06-15 NOTE — Telephone Encounter (Signed)
 Patient came into office to see if she could get 5 day supply of Prednisone due to her having torn hamstrings and patient is leaving out of town Sunday, please advise, thanks.

## 2023-06-15 NOTE — Telephone Encounter (Signed)
 No

## 2023-06-29 ENCOUNTER — Ambulatory Visit: Admitting: Sports Medicine

## 2023-06-29 ENCOUNTER — Encounter: Payer: Self-pay | Admitting: Physical Therapy

## 2023-06-29 ENCOUNTER — Ambulatory Visit: Payer: 59 | Admitting: Physical Therapy

## 2023-06-29 ENCOUNTER — Encounter: Payer: Self-pay | Admitting: Sports Medicine

## 2023-06-29 ENCOUNTER — Telehealth: Payer: Self-pay | Admitting: Sports Medicine

## 2023-06-29 DIAGNOSIS — M79605 Pain in left leg: Secondary | ICD-10-CM

## 2023-06-29 DIAGNOSIS — M79604 Pain in right leg: Secondary | ICD-10-CM | POA: Diagnosis not present

## 2023-06-29 DIAGNOSIS — S76311A Strain of muscle, fascia and tendon of the posterior muscle group at thigh level, right thigh, initial encounter: Secondary | ICD-10-CM

## 2023-06-29 DIAGNOSIS — M76812 Anterior tibial syndrome, left leg: Secondary | ICD-10-CM

## 2023-06-29 DIAGNOSIS — M6281 Muscle weakness (generalized): Secondary | ICD-10-CM

## 2023-06-29 DIAGNOSIS — R252 Cramp and spasm: Secondary | ICD-10-CM

## 2023-06-29 NOTE — Progress Notes (Signed)
    Procedures performed today:    None.  Independent interpretation of notes and tests performed by another provider:   None.  Brief History, Exam, Impression, and Recommendations:    Anterior tibialis tendinitis, left Kimaya returns, she is having recurrence of pain left anterior tibialis tendon and muscle belly, the pain is reproduced with resisted dorsiflexion of the ankle. We treated this last in 2023. I have added a toe lift, home conditioning, she can use over-the-counter analgesics and return as needed.  Hamstring tear, bilateral, complete on the right, partial on the left Prior history: This is a very pleasant 58 year old female, she has chronic bilateral posterior hip pain, ultimately MRI did show complete tearing of the right hamstring from the ischial tuberosity, partial tearing on the left, we got a consult with Dr. Dion Saucier, he and I both agree that surgery was minimally efficacious and that this would heal on its own. Dr. Dion Saucier has suggested 6 months of conservative treatment and I agree. She then had another slip and fall in the ice, with increasing pain. She is able to ambulate, but has some discomfort in the buttock when rising from a seated position. I did suggest tramadol for pain relief in the meantime as well as bilateral thigh compression sleeves She has not done either, we had a long discussion, she will start the tramadol, she will do her thigh compression sleeves and we can follow this up more in an as-needed basis per She does have an appointment coming up with Dr. Dion Saucier.  Current history 06/29/2023: Serra does return, she is still having some hamstring pain, she did finally do the thigh sleeves and the hamstring conditioning and has noted some improvements, pain does recur when she takes off the sleeves. She has not done any tramadol, I reassured her and reminded her that this would take up to 6 months to get better. She should use the tramadol as needed, and can  return to see me in 3 months as needed.    ____________________________________________ [name]. Benjamin Stain, M.D., ABFM., CAQSM., AME. Primary Care and Sports Medicine [facility]  Adjunct Professor of Family Medicine  [employer] [facility] [employer]  Restaurant manager, fast food

## 2023-06-29 NOTE — Assessment & Plan Note (Signed)
 Prior history: This is a very pleasant 58 year old female, she has chronic bilateral posterior hip pain, ultimately MRI did show complete tearing of the right hamstring from the ischial tuberosity, partial tearing on the left, we got a consult with Dr. Dion Saucier, he and I both agree that surgery was minimally efficacious and that this would heal on its own. Dr. Dion Saucier has suggested 6 months of conservative treatment and I agree. She then had another slip and fall in the ice, with increasing pain. She is able to ambulate, but has some discomfort in the buttock when rising from a seated position. I did suggest tramadol for pain relief in the meantime as well as bilateral thigh compression sleeves She has not done either, we had a long discussion, she will start the tramadol, she will do her thigh compression sleeves and we can follow this up more in an as-needed basis per She does have an appointment coming up with Dr. Dion Saucier.  Current history 06/29/2023: Patricia Arroyo does return, she is still having some hamstring pain, she did finally do the thigh sleeves and the hamstring conditioning and has noted some improvements, pain does recur when she takes off the sleeves. She has not done any tramadol, I reassured her and reminded her that this would take up to 6 months to get better. She should use the tramadol as needed, and can return to see me in 3 months as needed.

## 2023-06-29 NOTE — Telephone Encounter (Signed)
 Pt would like new order for Physical Therapy to be sent to   [facility]  [address] [place], [place] [address]  Ph: [phone] Fax: [phone]  Pt would like to continue seeing Harrie Foreman, PT. Please advise.

## 2023-06-29 NOTE — Telephone Encounter (Signed)
 Referral placed.

## 2023-06-29 NOTE — Assessment & Plan Note (Signed)
 Patricia Arroyo returns, she is having recurrence of pain left anterior tibialis tendon and muscle belly, the pain is reproduced with resisted dorsiflexion of the ankle. We treated this last in 2023. I have added a toe lift, home conditioning, she can use over-the-counter analgesics and return as needed.

## 2023-06-29 NOTE — Therapy (Signed)
 OUTPATIENT PHYSICAL THERAPY LOWER EXTREMITY EVALUATION   Patient Name: Patricia Arroyo MRN: [id number] DOB:[birth year], 58 y.o., female Today's Date: 06/29/2023  END OF SESSION:  PT End of Session - 06/29/23 0938     Visit Number 3    Date for PT Re-Evaluation 07/13/23    Authorization Type U[employer]    PT Start Time 0935    PT Stop Time 1020    PT Time Calculation (min) 45 min    Activity Tolerance Patient tolerated treatment well    Behavior During Therapy WFL for tasks assessed/performed              Past Medical History:  Diagnosis Date   ADHD (attention deficit hyperactivity disorder)    Anxiety    Carpal tunnel syndrome    Depression    h/o---no meds now   Diabetes mellitus without complication (HCC)    GERD (gastroesophageal reflux disease)    Hypertension    IBS (irritable bowel syndrome)    Varicose veins    Past Surgical History:  Procedure Laterality Date   ABDOMINOPLASTY  2020   ANTERIOR AND POSTERIOR REPAIR  03/10/2011   Procedure: ANTERIOR (CYSTOCELE) AND POSTERIOR REPAIR (RECTOCELE);  Surgeon: Turner Daniels, MD;  Location: [facility];  Service: Gynecology;  Laterality: N/A;  with Sacrospinous Ligament Suspension   BREAST REDUCTION SURGERY Bilateral 2020   CARPAL TUNNEL RELEASE Bilateral 04/01/2013   Procedure: BILATERAL CARPAL TUNNEL RELEASE;  Surgeon: Tami Ribas, MD;  Location: [facility];  Service: Orthopedics;  Laterality: Bilateral;   COLONOSCOPY     DILATION AND CURETTAGE OF UTERUS     FOOT FASCIOTOMY  2005   both feet   LAPAROSCOPIC ASSISTED VAGINAL HYSTERECTOMY  03/10/2011   Procedure: LAPAROSCOPIC ASSISTED VAGINAL HYSTERECTOMY;  Surgeon: Turner Daniels, MD;  Location: [facility];  Service: Gynecology;  Laterality: N/A;   NOVASURE ABLATION     SALPINGOOPHORECTOMY  03/10/2011   Procedure: SALPINGO OOPHERECTOMY;  Surgeon: Turner Daniels, MD;  Location: [facility];  Service: Gynecology;  Laterality: Bilateral;   WISDOM TOOTH EXTRACTION     Patient  Active Problem List   Diagnosis Date Noted   Hamstring tear, bilateral, complete on the right, partial on the left 03/09/2023   Cervical spondylosis 09/02/2022   Lumbosacral spondylosis 11/24/2021   Tick bite 11/14/2021   Anterior tibialis tendinitis, left 10/19/2021   Osteoarthritis of both wrists and hands 09/01/2021   Acute meniscal tear of right knee 03/22/2021   Injury of second toe of right foot 08/24/2020   Aortic atherosclerosis (HCC) 05/15/2020   Ureteric stone 04/23/2020   Gross hematuria 04/23/2020   Lower abdominal pain 03/17/2020   Attention deficit hyperactivity disorder, predominantly inattentive type 11/13/2019   Arthritis 11/13/2019   Anxiety 11/13/2019   Left wrist pain 01/10/2019   Disorder of rotator cuff, left 08/17/2017   Well adult exam 05/20/2016   Ganglion cyst of wrist 09/03/2015   Primary osteoarthritis of left knee 05/27/2015   Prepatellar bursitis, left knee 02/06/2015   Nocturnal leg cramps 02/06/2015   Diabetes type 2, controlled (HCC) 01/13/2015   Fatty liver disease, nonalcoholic 01/13/2015   Essential hypertension 04/20/2014   IBS (irritable bowel syndrome) 04/20/2014   GERD (gastroesophageal reflux disease) 04/20/2014   Varicose veins without complication 02/03/2014    PCP: Everrett Coombe, DO   REFERRING PROVIDER: Monica Becton, MD  REFERRING DIAG: [phone] (ICD-10-CM) - Hamstring injury, right, sequela   THERAPY DIAG:  Pain in right leg  Pain  in left leg  Muscle weakness (generalized)  Cramp and spasm  Rationale for Evaluation and Treatment: Rehabilitation  ONSET DATE: 02/20/2023 hurt hamstring  SUBJECTIVE:   SUBJECTIVE STATEMENT: 06/29/2023 Just got back from birding trip in [place], felt better while on trip, did a lot of walking.  Started hurting more after returning to work for 3 days.  Also having trouble with L ankle swelling.  Sees Dr. Karie Schwalbe after PT.    From HY:QMVHQI sure I tore my R hamstring again, I fell on the ice  getting into the car.   She had been to PT back in December 2024 and it was healing and feeling better.  Saw surgeon and was told it would heal itself in 6-7 months, it was torn in half but rarely surgically repaired.  It was healing when she fell on the ice and reinjured.  Other side is torn too, but not as bad. Can't sleep, using heat, told to avoid squatting, trying to avoid pain medication.   Back in 02/21/23 "Recent misstep, she felt a pull in the right buttock, now she has pain at the ischial tuberosity.  On exam she does have weakness to resisted knee flexion on the right.  Suspect hamstring strain, we will get some x-rays to ensure no ischial tuberosity avulsion fracture, she will also do some physical therapy."  PERTINENT HISTORY: ADHD, HTN, T2DM (well controlled), NASH, IBS, GERD, OA L knee, R knee meniscal tear (repaired beginning of year), L prepatellar bursitis. PAIN:  Are you having pain? Yes: NPRS scale: 13 Pain location: R side low back    Are you having pain? Yes: NPRS scale: 6/10 Pain location: bil hamstring  Pain description: pain Aggravating factors: hard surfaces, walking too fast Relieving factors: walking slow   PRECAUTIONS: None  RED FLAGS: None   WEIGHT BEARING RESTRICTIONS: No  FALLS:  Has patient fallen in last 6 months? Yes. Number of falls 1 tripped, works around a lot of trip hazards at work  LIVING ENVIRONMENT: Lives with: lives with their spouse Lives in: House/apartment Stairs: Yes: Internal: 2 steps; none Has following equipment at home: None  OCCUPATION: Psychiatric nurse, on feet on concrete long shifts  PLOF: Independent  PATIENT GOALS: feel better and strengthening my legs.   NEXT MD VISIT: 03/13/23 with referring provider  OBJECTIVE:  Note: Objective measures were completed at Evaluation unless otherwise noted.  DIAGNOSTIC FINDINGS:  03/12/24 MR Hip IMPRESSION: 1. Near complete tear of the right hamstrings from the ischial tuberosity  with 1-2 cm of tendon retraction. 2. Mild-to-moderate right hip osteoarthritis with degenerative tearing of the anterior and superior labrum. 3. Left hamstring tendinosis without tear.  PATIENT SURVEYS:  LEFS 23/80= 29% ability   COGNITION: Overall cognitive status: Within functional limits for tasks assessed     SENSATION: WFL  MUSCLE LENGTH: Hamstrings: Right 50 deg through popliteal angle; Left 40 deg through popliteal angle   POSTURE: rounded shoulders and forward head  PALPATION: Extreme tenderness bil at ischial tuberosities,   LOWER EXTREMITY MMT:  MMT Right eval Left eval  Hip flexion 4+p!  5  Hip extension    Hip abduction 5p! (seated) 5 (seated)  Hip adduction 4+ (Seated) 4+ (Seated)  Knee flexion 4+p! (HS) 4+p! (HS)  Knee extension 5p! 5p!   (Blank rows = not tested)  LOWER EXTREMITY ROM:  WNL in hips  FUNCTIONAL TESTS:  5x STS = 18.6 seconds  GAIT: Distance walked: 32' Assistive device utilized: None Level of assistance: Complete Independence  Comments: no significant deviation   TODAY'S TREATMENT:                                                                                                                              DATE:  06/29/23 Therapeutic Exercise: to improve strength and mobility.  Demo, verbal and tactile cues throughout for technique. Nustep L4 x 6 min Supine Pelvic tilts Knee rocks Hip Internal/External rotation - windshield wipers Piriformis stretches SKTC stretch - with dynamic hamstring stretch Manual Therapy: to decrease muscle spasm and pain and improve mobility STM/TPR to R lumbar paraspinals, glutes, piriformis, R UPA mobs lumbar spine, skilled palpation and monitoring during dry needling. Trigger Point Dry Needling  Subsequent Treatment: Instructions provided previously at initial dry needling treatment.  Instructions reviewed, if requested by the patient, prior to subsequent dry needling treatment.   Patient Verbal  Consent Given: Yes Education Handout Provided: Previously Provided Muscles Treated: R L4/5 multifidi, R QL, R piriformis Electrical Stimulation Performed: No Treatment Response/Outcome: Twitch Response Elicited and Palpable Increase in Muscle Length  06/13/23 Therapeutic Exercise: to improve strength and mobility.  Demo, verbal and tactile cues throughout for technique. Bike L1 x 5 min Seated hamstring stretch Seated piriformis stretches Standing runners stretches Standing ITBand stretch HEP update Ultrasound: x 6 min each side (12 min total ) to bil proximal hamstrings 1 MHz, 1.2 w/cm2 cont to decrease inflammation/pain Manual Therapy: to decrease muscle spasm and pain and improve mobility STM/TPR to bil glutes/piriformis, lumbar paraspinals, UPA mobs lumbar spine, skilled palpation and monitoring during dry needling. Trigger Point Dry Needling  Subsequent Treatment: Instructions provided previously at initial dry needling treatment.  Instructions reviewed, if requested by the patient, prior to subsequent dry needling treatment.   Patient Verbal Consent Given: Yes Education Handout Provided: Previously Provided Muscles Treated: bil glut max, R piriformis, R L5/S1 multifidi Electrical Stimulation Performed: No Treatment Response/Outcome: Twitch Response Elicited and Palpable Increase in Muscle Length  06/01/23 EVAL Ultrasound: x 6 min each side (12 min total ) to bil proximal hamstrings 1 MHz, 1.2 w/cm2 cont to decrease inflammation/pain Manual Therapy: to decrease muscle spasm and pain and improve mobility STM/TPR to bil glutes/piriformis, skilled palpation and monitoring during dry needling. Trigger Point Dry Needling  Subsequent Treatment: Instructions provided previously at initial dry needling treatment.  Instructions reviewed, if requested by the patient, prior to subsequent dry needling treatment.   Patient Verbal Consent Given: Yes Education Handout Provided: Previously  Provided Muscles Treated: bil glut max, bil piriformis Electrical Stimulation Performed: No Treatment Response/Outcome: Twitch Response Elicited and Palpable Increase in Muscle Length   PATIENT EDUCATION:  Education details: HEP update Person educated: Patient Education method: Explanation, Demonstration, Verbal cues, and Handouts Education comprehension: verbalized understanding and returned demonstration  HOME EXERCISE PROGRAM: Access Code: [email or web address] URL: [email or web address] Date: 06/29/2023 Prepared by: Harrie Foreman  Exercises - Seated Piriformis Stretch with Trunk Bend  - 1 x daily - 7 x weekly - 1 sets -  3 reps - 15-30 sec hold - Seated Piriformis Stretch  - 1 x daily - 7 x weekly - 1 sets - 3 reps - 15-30 sec hold - Seated Hamstring Stretch  - 1 x daily - 7 x weekly - 1 sets - 3 reps - 15-30 sec hold - Gastroc Stretch on Wall  - 1 x daily - 7 x weekly - 1 sets - 3 reps - 15-30 sec hold - Supine Posterior Pelvic Tilt  - 1 x daily - 7 x weekly - 1 sets - 10 reps - Supine Posterior Pelvic Tilt with Knee Rocks  - 1 x daily - 7 x weekly - 1 sets - 10 reps - Supine Hip Internal and External Rotation  - 1 x daily - 7 x weekly - 1 sets - 10 reps - Supine Piriformis Stretch with Foot on Ground  - 1 x daily - 7 x weekly - 1 sets - 3 reps - 30 hold - Supine Sciatic Nerve Glide  - 1 x daily - 7 x weekly - 1 sets - 10 reps  ASSESSMENT:  CLINICAL IMPRESSION: KIMERLY ROWAND reported decreased pain during trip, even with driving prolonged distances and walking, but now that she is back to work having increased R sided low back pain in addition to bil hamstring pain.  Given gentle exercises for back/hips followed by manual therapy to low back, afterwards reported significantly decreased LBP and improved ability to walk upright with better posture.   LADONA ROSTEN continues to demonstrate potential for improvement and would benefit from continued skilled therapy to address  impairments.     OBJECTIVE IMPAIRMENTS: decreased activity tolerance, decreased mobility, decreased ROM, decreased strength, increased muscle spasms, impaired flexibility, and pain.   ACTIVITY LIMITATIONS: lifting, bending, sitting, standing, squatting, sleeping, stairs, bed mobility, and locomotion level  PARTICIPATION LIMITATIONS: meal prep, cleaning, laundry, driving, shopping, community activity, and occupation  PERSONAL FACTORS: Past/current experiences, Time since onset of injury/illness/exacerbation, and 1-2 comorbidities: ADHD, HTN, T2DM (well controlled), NASH, IBS, GERD, OA L knee, L prepatellar bursitis.  are also affecting patient's functional outcome.   REHAB POTENTIAL: Good  CLINICAL DECISION MAKING: Stable/uncomplicated  EVALUATION COMPLEXITY: Low   GOALS: Goals reviewed with patient? Yes  SHORT TERM GOALS: Target date: 06/15/2023  Patient will be independent with initial HEP. Baseline: needs Goal status: IN PROGRESS given 06/13/23  LONG TERM GOALS: Target date: 07/13/2023   Patient will be independent with advanced/ongoing HEP to improve outcomes and carryover.  Baseline:  Goal status: IN PROGRESS  2.  Patient will report at least 75% improvement in R buttock pain to improve QOL. Baseline: 8/10 Goal status: IN PROGRESS  3.  Patient will demonstrate improved functional LE strength as demonstrated by 5/5 LE strength without pain. Baseline: see objective Goal status: IN PROGRESS  4.  Patient will report 9 points improvement on LEFS to demonstrate improved functional ability. Baseline: 23/80 Goal status: IN PROGRESS  5.  Patient will be able to sit for 30 min without discomfort.  Baseline: had to lean and reposition constantly today due to pain Goal status: IN PROGRESS   PLAN:  PT FREQUENCY: 1-2x/week  PT DURATION: 6 weeks  PLANNED INTERVENTIONS: 97110-Therapeutic exercises, 97530- Therapeutic activity, [id number]- Neuromuscular re-education, 97535- Self  Care, 13086- Manual therapy, [id number]- Gait training, [phone]- Orthotic Fit/training, 97014- Electrical stimulation (unattended), [id number]- Electrical stimulation (manual), [id number]- Ultrasound, [id number]- Traction (mechanical), [id number]- Ionotophoresis 4mg /ml Dexamethasone, Patient/Family education, Balance training, Stair training, Taping, Dry Needling, Joint mobilization, Joint  manipulation, Spinal manipulation, Spinal mobilization, Cryotherapy, and Moist heat  PLAN FOR NEXT SESSION: HEP for gentle HS and quad stretches, continue modalities PRN.     Jena Gauss, PT, DPT 06/29/2023, 11:21 AM

## 2023-07-03 NOTE — Telephone Encounter (Signed)
 Referral was faxed on 06/29/23 to [facility] [place] at [phone].

## 2023-07-11 ENCOUNTER — Ambulatory Visit: Admitting: Sports Medicine

## 2023-07-17 ENCOUNTER — Ambulatory Visit: Admitting: Sports Medicine

## 2023-09-15 ENCOUNTER — Ambulatory Visit: Admitting: Sports Medicine

## 2023-09-21 ENCOUNTER — Other Ambulatory Visit: Admitting: Surgery

## 2023-10-03 ENCOUNTER — Other Ambulatory Visit: Payer: Self-pay | Admitting: *Deleted

## 2023-10-03 DIAGNOSIS — I83812 Varicose veins of left lower extremities with pain: Secondary | ICD-10-CM

## 2023-10-05 ENCOUNTER — Encounter: Admitting: Surgery

## 2023-10-05 ENCOUNTER — Encounter (HOSPITAL_COMMUNITY)

## 2023-12-05 ENCOUNTER — Encounter: Payer: Self-pay | Admitting: Sports Medicine

## 2023-12-18 ENCOUNTER — Other Ambulatory Visit: Payer: Self-pay | Admitting: Family Medicine

## 2023-12-18 DIAGNOSIS — M47817 Spondylosis without myelopathy or radiculopathy, lumbosacral region: Secondary | ICD-10-CM

## 2023-12-18 DIAGNOSIS — M5412 Radiculopathy, cervical region: Secondary | ICD-10-CM

## 2023-12-18 NOTE — Telephone Encounter (Unsigned)
 Copied from CRM [id number]. Topic: Clinical - Medication Refill >> Dec 18, 2023  3:54 PM Zane F wrote: Patient called in to have the following prescription refilled. Patient is aware of the previous prescribing provider departure. Please assist with refill. Patient is out of the prescription listed.  Callback Number: [id number]   Medication: etodolac  (LODINE  XL) 500 MG 24 hr tablet   Has the patient contacted their pharmacy? No  This is the patient's preferred pharmacy:  [id number] - [place], [place] - [address] [place] [address] Phone: [phone] Fax: [phone]  Is this the correct pharmacy for this prescription? Yes  Has the prescription been filled recently? No  Is the patient out of the medication? Yes  Has the patient been seen for an appointment in the last year OR does the patient have an upcoming appointment? Yes  Can we respond through MyChart? Yes  Agent: Please be advised that Rx refills may take up to 3 business days. We ask that you follow-up with your pharmacy.

## 2023-12-19 ENCOUNTER — Ambulatory Visit (HOSPITAL_BASED_OUTPATIENT_CLINIC_OR_DEPARTMENT_OTHER)
Admission: RE | Admit: 2023-12-19 | Discharge: 2023-12-19 | Disposition: A | Discharge status: Home / Self Care | Source: Ambulatory Visit | Attending: Vascular Surgery | Admitting: Vascular Surgery

## 2023-12-19 ENCOUNTER — Other Ambulatory Visit: Payer: Self-pay

## 2023-12-19 ENCOUNTER — Telehealth: Payer: Self-pay | Admitting: *Deleted

## 2023-12-19 ENCOUNTER — Emergency Department (HOSPITAL_COMMUNITY)

## 2023-12-19 ENCOUNTER — Encounter (HOSPITAL_COMMUNITY): Payer: Self-pay

## 2023-12-19 ENCOUNTER — Emergency Department (HOSPITAL_COMMUNITY)
Admission: EM | Admit: 2023-12-19 | Discharge: 2023-12-19 | Discharge status: Against Medical Advice | Attending: Vascular Surgery | Admitting: Vascular Surgery

## 2023-12-19 ENCOUNTER — Other Ambulatory Visit: Payer: Self-pay | Admitting: *Deleted

## 2023-12-19 DIAGNOSIS — M7989 Other specified soft tissue disorders: Secondary | ICD-10-CM

## 2023-12-19 DIAGNOSIS — M79605 Pain in left leg: Secondary | ICD-10-CM | POA: Diagnosis present

## 2023-12-19 DIAGNOSIS — M79662 Pain in left lower leg: Secondary | ICD-10-CM | POA: Insufficient documentation

## 2023-12-19 DIAGNOSIS — I83892 Varicose veins of left lower extremities with other complications: Secondary | ICD-10-CM | POA: Diagnosis present

## 2023-12-19 DIAGNOSIS — Z5321 Procedure and treatment not carried out due to patient leaving prior to being seen by health care provider: Secondary | ICD-10-CM | POA: Insufficient documentation

## 2023-12-19 DIAGNOSIS — M25562 Pain in left knee: Secondary | ICD-10-CM | POA: Diagnosis not present

## 2023-12-19 MED ORDER — ETODOLAC ER 500 MG PO TB24: 500.0000 mg | ORAL_TABLET | Freq: Two times a day (BID) | ORAL | 3 refills | Status: AC

## 2023-12-19 NOTE — ED Provider Triage Note (Signed)
 Emergency Medicine Provider Triage Evaluation Note  Patricia Arroyo , a 58 y.o. female  was evaluated in triage.  Pt complains of left knee and lower leg pain, negative DVT study today, worse with walking.  Review of Systems  Positive: L leg pain Negative: Rash, fever  Physical Exam  BP 133/85 (BP Location: Right Arm)   Pulse 85   Temp 97.9 F (36.6 C)   Resp 17   Ht 5' 2 (1.575 m)   Wt 73 kg   SpO2 96%   BMI 29.44 kg/m  Gen:   Awake, no distress   Resp:  Normal effort  MSK:   Moves extremities without difficulty  Other:  Varicose vein of left leg. Locates pain to left posterior knee  Medical Decision Making  Medically screening exam initiated at 1:55 PM.  Appropriate orders placed.  Patricia Arroyo was informed that the remainder of the evaluation will be completed by another provider, this initial triage assessment does not replace that evaluation, and the importance of remaining in the ED until their evaluation is complete.     Shermon Warren SAILOR, PA-C 12/19/23 1356

## 2023-12-19 NOTE — Telephone Encounter (Signed)
 Returning telephone message from Mrs.Araiza regarding severe left leg pain and difficulty walking or bending left leg.  Mrs.Easterwood states she has been experiencing these symptoms since 12-18-2023.  Denies shortness of breath or swelling of left calf or foot.  Mrs. Lineman denies falls or trauma to left leg. Mrs. Mcneely states she is unable to come into office today for venous duplex (left leg).  Appointment made for 12-20-2023 9:00 AM venous duplex (left leg, order in EPIC) and 9:40 AM (triage) appointment with Dr. Sheree. Advised Mrs. Miyamoto to go to ER if she experiences shortness of breath or swelling of left calf or foot.  Left detailed voice message on Mrs. Boorman cell phone regarding appointments for tomorrow.

## 2023-12-19 NOTE — ED Triage Notes (Signed)
 Pt c/o left leg pain that has been intermittent since 3 weeks ago. Pt sent by doctor office for evaluation. Pt states her blood clot test was negative. Pt states she is unable to walk d/t pain.

## 2023-12-19 NOTE — Telephone Encounter (Signed)
 Returning Mrs. Patricia Arroyo's call. Patricia Arroyo states that her left leg pain is very severe now and that she is unable to walk. States she is leaving work and requesting appointment for venous duplex study (left leg) today.  Coordinated with Cleatus at [facility] vascular lab and he set up appointment for venous duplex (left leg) today at 11:30 AM with approval by Geni. Patricia Arroyo is aware of appointment and states she will arrive in about 40 minutes.  Notified front desk staff and valet services downstairs to assist patient via wheelchair to vascular lab on 4th floor as soon as she arrives.  Will coordinate with [facility] pending venous duplex results.

## 2023-12-19 NOTE — ED Notes (Signed)
 Pt got extremely loud and aggressive and ripped the wheelchair away from the EMT when asked for the wheelchair for a critical pt. We were able to use another wheelchair, but pt was advised that security would be called if something else happened and a wheelchair was needed. Noting there are numerous wheelchairs not available in the ED at this time.

## 2023-12-20 ENCOUNTER — Ambulatory Visit (HOSPITAL_COMMUNITY)

## 2023-12-20 ENCOUNTER — Ambulatory Visit: Admitting: Vascular Surgery

## 2023-12-20 ENCOUNTER — Ambulatory Visit: Admitting: Medical-Surgical

## 2024-01-02 ENCOUNTER — Telehealth: Payer: Self-pay

## 2024-01-02 NOTE — Telephone Encounter (Signed)
 Pt called yesterday with c/o LLE veins more prominent with tenderness x 4 days. She had a DVT study that was negative last week. She is wearing compression. She was asked to send us  a picture via MyChart and was in agreement but we never received one. I tried her back yesterday and again today and left VM for her to call us  if she still needs assistance.

## 2024-01-02 NOTE — Telephone Encounter (Signed)
 Pt called back to let us  know she is unable to find where to send pictures to us  on MyChart. She has c/o LLE tenderness over a varicose vein for the last few days. She has been advised to apply compression, elevate, take Ibuprofen , and warm compresses. She is in agreement and will call us  back if anything changes/worsens.

## 2024-01-17 ENCOUNTER — Other Ambulatory Visit: Payer: Self-pay | Admitting: *Deleted

## 2024-01-17 MED ORDER — LORAZEPAM 1 MG PO TABS: ORAL_TABLET | ORAL | 0 refills | Status: AC

## 2024-01-23 DIAGNOSIS — Z0279 Encounter for issue of other medical certificate: Secondary | ICD-10-CM

## 2024-01-25 ENCOUNTER — Ambulatory Visit: Attending: Surgery | Admitting: Surgery

## 2024-01-25 ENCOUNTER — Encounter: Payer: Self-pay | Admitting: Surgery

## 2024-01-25 VITALS — BP 137/76 | HR 99 | Temp 97.7°F | Ht 61.0 in | Wt 155.0 lb

## 2024-01-25 DIAGNOSIS — I83892 Varicose veins of left lower extremities with other complications: Secondary | ICD-10-CM

## 2024-01-25 DIAGNOSIS — I83812 Varicose veins of left lower extremities with pain: Secondary | ICD-10-CM

## 2024-01-25 NOTE — Progress Notes (Signed)
     Laser Ablation Procedure    Date: 01/25/2024 Patricia Arroyo DOB:[birth year] Consent signed: Yes     Surgeon: Dr. Gaile New  Procedure: Laser Ablation: left  proximal Greater Saphenous Vein  BP 137/76 (BP Location: Left Arm, Patient Position: Sitting, Cuff Size: Normal)   Pulse 99   Temp 97.7 F (36.5 C) (Temporal)   Ht 5' 1 (1.549 m)   Wt 155 lb (70.3 kg)   SpO2 97%   BMI 29.29 kg/m  Tumescent Anesthesia: 450 cc 0.9% NaCl with 50 cc Lidocaine  HCL 1%  and 15 cc 8.4% NaHCO3 Local Anesthesia: 7 cc Lidocaine  HCL and NaHCO3 (ratio 2:1) 7 watts continuous mode    Total energy: 308.7 joules    Total time: 44 seconds Treatment Length 5 cm  Laser Fiber Ref. #  [id number]     Lot # [id number] Stab Phlebectomy: >20 Sites: Thigh, Calf, and Ankle  Patient tolerated procedure well  Notes:  All staff members wore facial masks and facial shields/goggles.  Pt had 2 mg of ativan at 07:36 am and another 1 mg tab at 08:00 am. Making a total of 3 mg of ativan prior to the surgical procedure   Description of Procedure:  After marking the course of the secondary varicosities, the patient was placed on the operating table in the supine position, and the left leg was prepped and draped in sterile fashion.   Local anesthetic was administered and under ultrasound guidance the saphenous vein was accessed with a micro needle and guide wire; then the mirco puncture sheath was placed.  A guide wire was inserted saphenofemoral junction , followed by a 5 french sheath.  The position of the sheath and then the laser fiber below the junction was confirmed using the ultrasound.  Tumescent anesthesia was administered along the course of the saphenous vein using ultrasound guidance. The patient was placed in Trendelenburg position and protective laser glasses were placed on patient and staff, and the laser was fired at 7 watts continuous mode for a total of 308.7 joules.  For stab phlebectomies, local anesthetic was  administered at the previously marked varicosities, and tumescent anesthesia was administered around the vessels.  Greater than 20 stab wounds were made using the tip of an 11 blade. And using the vein hook, the phlebectomies were performed using a hemostat to avulse the varicosities.  Adequate hemostasis was achieved.   Steri strips were applied to the stab wounds and ABD pads and thigh high compression stockings were applied.  Ace wrap bandages were applied over the phlebectomy sites and at the top of the saphenofemoral junction. Blood loss was less than 15 cc.  Discharge instructions reviewed with patient and hardcopy of discharge instructions given to patient to take home. The patient was wheeled out of the operating room having tolerated the procedure well.

## 2024-01-31 ENCOUNTER — Telehealth: Payer: Self-pay

## 2024-01-31 NOTE — Telephone Encounter (Signed)
 Pt called with c/o pain still in same area of LLE that she had prior to laser ablation/stabs. She does not notice any swelling in the area. MD made aware. She has been advised to continue to wear compression hose and elevate periodically throughout the day. If this persists or worsens, she will call us  back. She is returning for f/u next week.

## 2024-02-01 ENCOUNTER — Telehealth: Payer: Self-pay | Admitting: *Deleted

## 2024-02-01 NOTE — Telephone Encounter (Signed)
 Returning Mrs. Brzozowski's phone call regarding LLE pain. S/p EVLA L GSV and stab phlebectomies > 20 incisions left leg on 01-25-2024 by Gaile New MD.    Mrs. Converse states she is continuing to have a localized spot on her left calf that is painful.  Mrs. Luckey states that it is over/near a steri strip covering an incision and it feels firm.  Advised Mrs. Wiedemann that blood can get trapped underneath the incisions and feel firm and that it can take weeks to resolve.  Advised her to continue elevating her left leg, wear her thigh high compression hose, take Ibuprofen  200 mg po TID with meals, and apply warm compress to left calf area that is painful.  Venous duplex and VV FU with Dr. New scheduled for 02-08-2024.

## 2024-02-08 ENCOUNTER — Ambulatory Visit (HOSPITAL_COMMUNITY)
Admission: RE | Admit: 2024-02-08 | Discharge: 2024-02-08 | Disposition: A | Discharge status: Home / Self Care | Source: Ambulatory Visit | Attending: Vascular Surgery | Admitting: Vascular Surgery

## 2024-02-08 ENCOUNTER — Ambulatory Visit (INDEPENDENT_AMBULATORY_CARE_PROVIDER_SITE_OTHER): Admitting: Surgery

## 2024-02-08 ENCOUNTER — Encounter: Payer: Self-pay | Admitting: Surgery

## 2024-02-08 VITALS — BP 147/97 | HR 95 | Temp 97.9°F | Ht 61.0 in | Wt 160.0 lb

## 2024-02-08 DIAGNOSIS — I83812 Varicose veins of left lower extremities with pain: Secondary | ICD-10-CM | POA: Diagnosis not present

## 2024-02-08 DIAGNOSIS — I83892 Varicose veins of left lower extremities with other complications: Secondary | ICD-10-CM

## 2024-02-08 NOTE — Progress Notes (Unsigned)
 Patient name: Patricia Arroyo MRN: [id number] DOB: 02-09-1966 Sex: female  REASON FOR VISIT:    Postop  HISTORY OF PRESENT ILLNESS:   Patricia Arroyo is a 58 y.o. female who has previously undergone bilateral saphenous vein stripping and stab phlebectomy by Dr. Gerlean in 2016.  She began having recurrent symptoms approximately 5 years ago.  On imaging she was found to have a 15 cm segment of her proximal left saphenous vein that had recanalized.  On 01/27/2024 she underwent endovenous laser ablation of the left great saphenous veins as well as greater than 20 stabs for her recurrent class III disease  CURRENT MEDICATIONS:    Current Outpatient Medications  Medication Sig Dispense Refill   amphetamine-dextroamphetamine (ADDERALL XR) 15 MG 24 hr capsule Take 30 mg by mouth.      buPROPion (WELLBUTRIN XL) 300 MG 24 hr tablet bupropion HCl XL 300 mg 24 hr tablet, extended release     conjugated estrogens (PREMARIN) vaginal cream Premarin 0.625 mg/gram vaginal cream  apply to vaginal and vulva twice weekly     cyclobenzaprine  (FLEXERIL ) 5 MG tablet Take 0.5-2 tablets (2.5-10 mg total) by mouth 3 (three) times daily as needed for muscle spasms. 180 tablet 11   diazepam (VALIUM) 10 MG tablet Take 10 mg by mouth daily as needed for anxiety.     diclofenac  Sodium (VOLTAREN ) 1 % GEL Apply 4 g topically 4 (four) times daily. To affected joint. 350 g 11   Elastic Bandages & Supports (MEDICAL COMPRESSION STOCKINGS) MISC 1 Units by Does not apply route daily. Thigh high 20-66mmhg 2 each 4   estradiol  (ESTRACE ) 1 MG tablet Take 1 mg by mouth daily.     etodolac  (LODINE  XL) 500 MG 24 hr tablet Take 1 tablet (500 mg total) by mouth 2 (two) times daily with a meal. 180 tablet 3   lisinopril  (ZESTRIL ) 10 MG tablet TAKE 1 TABLET (10 MG TOTAL) BY MOUTH DAILY. 90 tablet 3   LORazepam (ATIVAN) 1 MG tablet Take 2 tablets 30 to 60 minutes prior to leaving house on day of office  surgery.  Bring third tablet with you to office on day of office surgery. 3 tablet 0   omeprazole  (PRILOSEC) 40 MG capsule TAKE 1 CAPSULE BY MOUTH EVERY DAY 90 capsule 3   triamterene -hydrochlorothiazide  (MAXZIDE -25) 37.5-25 MG tablet Take 1 tablet by mouth daily. 90 tablet 3   No current facility-administered medications for this visit.    REVIEW OF SYSTEMS:   [X]  denotes positive finding, [ ]  denotes negative finding Cardiac  Comments:  Chest pain or chest pressure: ***   Shortness of breath upon exertion:    Short of breath when lying flat:    Irregular heart rhythm:    Constitutional    Fever or chills:      PHYSICAL EXAM:   Vitals:   02/08/24 1028  BP: (!) 147/97  Pulse: 95  Temp: 97.9 F (36.6 C)  SpO2: 95%  Weight: 160 lb (72.6 kg)  Height: 5' 1 (1.549 m)    GENERAL: The patient is a well-nourished female, in no acute distress. The vital signs are documented above. CARDIOVASCULAR: There is a regular rate and rhythm. PULMONARY: Non-labored respirations ***  STUDIES:   Post ablation reflux study: Left:  - No evidence of deep vein thrombosis from the common femoral through the  popliteal veins.  - Muscular vein thrombus observed involving the gastrocnemius veins in the  proximal calf. The gastrocnemius veins in the  popliteal fossa are patent  and compressible.  - Successful ablation of the great saphenous vein from the mid thigh up to  approximately 1.51 cm from the SFJ.   MEDICAL ISSUES:   ***  Malvina Serene CLORE, MD, FACS [facility] [phone] Pager [phone]

## 2024-02-09 ENCOUNTER — Encounter: Payer: Self-pay | Admitting: Surgery

## 2024-02-17 ENCOUNTER — Other Ambulatory Visit: Payer: Self-pay | Admitting: Family Medicine

## 2024-02-21 ENCOUNTER — Other Ambulatory Visit: Payer: Self-pay | Admitting: Family Medicine

## 2024-02-21 DIAGNOSIS — I10 Essential (primary) hypertension: Secondary | ICD-10-CM

## 2024-02-22 NOTE — Telephone Encounter (Signed)
 Pls contact the pt to schedule appt with Dr. Alvia for HTN and labs. Sending 30 day med refill. Thx

## 2024-02-22 NOTE — Telephone Encounter (Signed)
 Patient is scheduled

## 2024-02-23 ENCOUNTER — Other Ambulatory Visit: Payer: Self-pay | Admitting: Family Medicine

## 2024-03-07 ENCOUNTER — Ambulatory Visit

## 2024-03-08 ENCOUNTER — Other Ambulatory Visit: Payer: Self-pay | Admitting: Family Medicine

## 2024-03-20 ENCOUNTER — Other Ambulatory Visit: Payer: Self-pay | Admitting: Family Medicine

## 2024-03-20 DIAGNOSIS — I10 Essential (primary) hypertension: Secondary | ICD-10-CM

## 2024-04-30 ENCOUNTER — Other Ambulatory Visit: Payer: Self-pay | Admitting: Family Medicine

## 2024-04-30 NOTE — Telephone Encounter (Signed)
 Requesting rx rf of Omeprazole  40mg   Last written 03/01/2023 Last OV 06/29/2023 Dr. Curtis Upcoming appt 05/31/2024

## 2024-05-05 ENCOUNTER — Other Ambulatory Visit: Payer: Self-pay | Admitting: Family Medicine

## 2024-05-05 DIAGNOSIS — I10 Essential (primary) hypertension: Secondary | ICD-10-CM

## 2024-05-31 ENCOUNTER — Encounter: Admitting: Family Medicine
